# Patient Record
Sex: Male | Born: 1955
Health system: Southern US, Community
[De-identification: ages and names within clinical notes are randomized; demographics above are authoritative.]

## PROBLEM LIST (undated history)

## (undated) DIAGNOSIS — E7800 Pure hypercholesterolemia, unspecified: Secondary | ICD-10-CM

## (undated) DIAGNOSIS — I639 Cerebral infarction, unspecified: Secondary | ICD-10-CM

## (undated) DIAGNOSIS — I219 Acute myocardial infarction, unspecified: Secondary | ICD-10-CM

## (undated) DIAGNOSIS — L309 Dermatitis, unspecified: Secondary | ICD-10-CM

## (undated) DIAGNOSIS — H269 Unspecified cataract: Secondary | ICD-10-CM

## (undated) DIAGNOSIS — I671 Cerebral aneurysm, nonruptured: Secondary | ICD-10-CM

## (undated) DIAGNOSIS — I509 Heart failure, unspecified: Secondary | ICD-10-CM

## (undated) DIAGNOSIS — E119 Type 2 diabetes mellitus without complications: Secondary | ICD-10-CM

## (undated) DIAGNOSIS — J449 Chronic obstructive pulmonary disease, unspecified: Secondary | ICD-10-CM

## (undated) DIAGNOSIS — G459 Transient cerebral ischemic attack, unspecified: Secondary | ICD-10-CM

## (undated) HISTORY — DX: Type 2 diabetes mellitus without complications: E11.9

## (undated) HISTORY — PX: TOE AMPUTATION: SHX809

## (undated) HISTORY — PX: CORONARY ANGIOPLASTY WITH STENT PLACEMENT: SHX49

## (undated) HISTORY — DX: Heart failure, unspecified: I50.9

## (undated) HISTORY — PX: KNEE SURGERY: SHX244

## (undated) HISTORY — DX: Unspecified cataract: H26.9

## (undated) HISTORY — DX: Chronic obstructive pulmonary disease, unspecified: J44.9

## (undated) HISTORY — DX: Cerebral infarction, unspecified: I63.9

## (undated) HISTORY — DX: Transient cerebral ischemic attack, unspecified: G45.9

## (undated) HISTORY — DX: Dermatitis, unspecified: L30.9

## (undated) HISTORY — DX: Cerebral aneurysm, nonruptured: I67.1

## (undated) HISTORY — DX: Pure hypercholesterolemia, unspecified: E78.00

## (undated) HISTORY — DX: Acute myocardial infarction, unspecified: I21.9

---

## 2015-03-26 DIAGNOSIS — Z87891 Personal history of nicotine dependence: Secondary | ICD-10-CM | POA: Diagnosis not present

## 2015-03-26 DIAGNOSIS — M1990 Unspecified osteoarthritis, unspecified site: Secondary | ICD-10-CM | POA: Diagnosis present

## 2015-03-26 DIAGNOSIS — A419 Sepsis, unspecified organism: Secondary | ICD-10-CM | POA: Diagnosis not present

## 2015-03-26 DIAGNOSIS — L89892 Pressure ulcer of other site, stage 2: Secondary | ICD-10-CM | POA: Diagnosis present

## 2015-03-26 DIAGNOSIS — Z880 Allergy status to penicillin: Secondary | ICD-10-CM | POA: Diagnosis not present

## 2015-03-26 DIAGNOSIS — E11621 Type 2 diabetes mellitus with foot ulcer: Secondary | ICD-10-CM | POA: Diagnosis present

## 2015-03-26 DIAGNOSIS — L03115 Cellulitis of right lower limb: Secondary | ICD-10-CM | POA: Diagnosis present

## 2015-03-26 DIAGNOSIS — L02611 Cutaneous abscess of right foot: Secondary | ICD-10-CM | POA: Diagnosis present

## 2015-03-26 DIAGNOSIS — Z23 Encounter for immunization: Secondary | ICD-10-CM | POA: Diagnosis not present

## 2015-04-03 DIAGNOSIS — M869 Osteomyelitis, unspecified: Secondary | ICD-10-CM | POA: Diagnosis not present

## 2015-04-03 DIAGNOSIS — L03115 Cellulitis of right lower limb: Secondary | ICD-10-CM | POA: Diagnosis not present

## 2015-04-03 DIAGNOSIS — E1169 Type 2 diabetes mellitus with other specified complication: Secondary | ICD-10-CM | POA: Diagnosis not present

## 2015-04-03 DIAGNOSIS — L89892 Pressure ulcer of other site, stage 2: Secondary | ICD-10-CM | POA: Diagnosis not present

## 2015-04-04 DIAGNOSIS — E1169 Type 2 diabetes mellitus with other specified complication: Secondary | ICD-10-CM | POA: Diagnosis not present

## 2015-04-04 DIAGNOSIS — M869 Osteomyelitis, unspecified: Secondary | ICD-10-CM | POA: Diagnosis not present

## 2015-04-04 DIAGNOSIS — L89892 Pressure ulcer of other site, stage 2: Secondary | ICD-10-CM | POA: Diagnosis not present

## 2015-04-04 DIAGNOSIS — L03115 Cellulitis of right lower limb: Secondary | ICD-10-CM | POA: Diagnosis not present

## 2015-04-07 DIAGNOSIS — Z87891 Personal history of nicotine dependence: Secondary | ICD-10-CM | POA: Diagnosis not present

## 2015-04-07 DIAGNOSIS — E1169 Type 2 diabetes mellitus with other specified complication: Secondary | ICD-10-CM | POA: Diagnosis not present

## 2015-04-07 DIAGNOSIS — I10 Essential (primary) hypertension: Secondary | ICD-10-CM | POA: Diagnosis present

## 2015-04-07 DIAGNOSIS — L89892 Pressure ulcer of other site, stage 2: Secondary | ICD-10-CM | POA: Diagnosis not present

## 2015-04-07 DIAGNOSIS — L03115 Cellulitis of right lower limb: Secondary | ICD-10-CM | POA: Diagnosis present

## 2015-04-07 DIAGNOSIS — L02611 Cutaneous abscess of right foot: Secondary | ICD-10-CM | POA: Diagnosis not present

## 2015-04-07 DIAGNOSIS — I252 Old myocardial infarction: Secondary | ICD-10-CM | POA: Diagnosis not present

## 2015-04-07 DIAGNOSIS — E7800 Pure hypercholesterolemia, unspecified: Secondary | ICD-10-CM | POA: Diagnosis present

## 2015-04-07 DIAGNOSIS — M1990 Unspecified osteoarthritis, unspecified site: Secondary | ICD-10-CM | POA: Diagnosis present

## 2015-04-07 DIAGNOSIS — Z8673 Personal history of transient ischemic attack (TIA), and cerebral infarction without residual deficits: Secondary | ICD-10-CM | POA: Diagnosis not present

## 2015-04-07 DIAGNOSIS — Z79899 Other long term (current) drug therapy: Secondary | ICD-10-CM | POA: Diagnosis not present

## 2015-04-07 DIAGNOSIS — M869 Osteomyelitis, unspecified: Secondary | ICD-10-CM | POA: Diagnosis not present

## 2015-04-07 DIAGNOSIS — Z880 Allergy status to penicillin: Secondary | ICD-10-CM | POA: Diagnosis not present

## 2015-04-07 DIAGNOSIS — I509 Heart failure, unspecified: Secondary | ICD-10-CM | POA: Diagnosis present

## 2015-04-07 DIAGNOSIS — J449 Chronic obstructive pulmonary disease, unspecified: Secondary | ICD-10-CM | POA: Diagnosis present

## 2015-04-11 DIAGNOSIS — L89892 Pressure ulcer of other site, stage 2: Secondary | ICD-10-CM | POA: Diagnosis not present

## 2015-04-11 DIAGNOSIS — E1169 Type 2 diabetes mellitus with other specified complication: Secondary | ICD-10-CM | POA: Diagnosis not present

## 2015-04-11 DIAGNOSIS — M869 Osteomyelitis, unspecified: Secondary | ICD-10-CM | POA: Diagnosis not present

## 2015-04-11 DIAGNOSIS — L03115 Cellulitis of right lower limb: Secondary | ICD-10-CM | POA: Diagnosis not present

## 2015-04-12 DIAGNOSIS — L03115 Cellulitis of right lower limb: Secondary | ICD-10-CM | POA: Diagnosis not present

## 2015-04-12 DIAGNOSIS — M869 Osteomyelitis, unspecified: Secondary | ICD-10-CM | POA: Diagnosis not present

## 2015-04-12 DIAGNOSIS — L89892 Pressure ulcer of other site, stage 2: Secondary | ICD-10-CM | POA: Diagnosis not present

## 2015-04-12 DIAGNOSIS — E1169 Type 2 diabetes mellitus with other specified complication: Secondary | ICD-10-CM | POA: Diagnosis not present

## 2015-04-13 DIAGNOSIS — L03115 Cellulitis of right lower limb: Secondary | ICD-10-CM | POA: Diagnosis not present

## 2015-04-13 DIAGNOSIS — E1169 Type 2 diabetes mellitus with other specified complication: Secondary | ICD-10-CM | POA: Diagnosis not present

## 2015-04-13 DIAGNOSIS — L89892 Pressure ulcer of other site, stage 2: Secondary | ICD-10-CM | POA: Diagnosis not present

## 2015-04-13 DIAGNOSIS — M869 Osteomyelitis, unspecified: Secondary | ICD-10-CM | POA: Diagnosis not present

## 2015-04-14 DIAGNOSIS — Z87891 Personal history of nicotine dependence: Secondary | ICD-10-CM | POA: Diagnosis not present

## 2015-04-14 DIAGNOSIS — I110 Hypertensive heart disease with heart failure: Secondary | ICD-10-CM | POA: Diagnosis not present

## 2015-04-14 DIAGNOSIS — Z7984 Long term (current) use of oral hypoglycemic drugs: Secondary | ICD-10-CM | POA: Diagnosis not present

## 2015-04-14 DIAGNOSIS — L03115 Cellulitis of right lower limb: Secondary | ICD-10-CM | POA: Diagnosis not present

## 2015-04-14 DIAGNOSIS — I509 Heart failure, unspecified: Secondary | ICD-10-CM | POA: Diagnosis not present

## 2015-04-14 DIAGNOSIS — L97512 Non-pressure chronic ulcer of other part of right foot with fat layer exposed: Secondary | ICD-10-CM | POA: Diagnosis not present

## 2015-04-14 DIAGNOSIS — E11621 Type 2 diabetes mellitus with foot ulcer: Secondary | ICD-10-CM | POA: Diagnosis not present

## 2015-04-14 DIAGNOSIS — J449 Chronic obstructive pulmonary disease, unspecified: Secondary | ICD-10-CM | POA: Diagnosis not present

## 2015-04-14 DIAGNOSIS — L97411 Non-pressure chronic ulcer of right heel and midfoot limited to breakdown of skin: Secondary | ICD-10-CM | POA: Diagnosis not present

## 2015-04-14 DIAGNOSIS — M869 Osteomyelitis, unspecified: Secondary | ICD-10-CM | POA: Diagnosis not present

## 2015-04-14 DIAGNOSIS — G4730 Sleep apnea, unspecified: Secondary | ICD-10-CM | POA: Diagnosis not present

## 2015-04-14 DIAGNOSIS — G629 Polyneuropathy, unspecified: Secondary | ICD-10-CM | POA: Diagnosis not present

## 2015-04-14 DIAGNOSIS — L89892 Pressure ulcer of other site, stage 2: Secondary | ICD-10-CM | POA: Diagnosis not present

## 2015-04-14 DIAGNOSIS — E1169 Type 2 diabetes mellitus with other specified complication: Secondary | ICD-10-CM | POA: Diagnosis not present

## 2015-04-17 DIAGNOSIS — L03115 Cellulitis of right lower limb: Secondary | ICD-10-CM | POA: Diagnosis not present

## 2015-04-17 DIAGNOSIS — L89892 Pressure ulcer of other site, stage 2: Secondary | ICD-10-CM | POA: Diagnosis not present

## 2015-04-17 DIAGNOSIS — E1169 Type 2 diabetes mellitus with other specified complication: Secondary | ICD-10-CM | POA: Diagnosis not present

## 2015-04-17 DIAGNOSIS — M869 Osteomyelitis, unspecified: Secondary | ICD-10-CM | POA: Diagnosis not present

## 2015-04-19 DIAGNOSIS — L89892 Pressure ulcer of other site, stage 2: Secondary | ICD-10-CM | POA: Diagnosis not present

## 2015-04-19 DIAGNOSIS — E1169 Type 2 diabetes mellitus with other specified complication: Secondary | ICD-10-CM | POA: Diagnosis not present

## 2015-04-19 DIAGNOSIS — M869 Osteomyelitis, unspecified: Secondary | ICD-10-CM | POA: Diagnosis not present

## 2015-04-19 DIAGNOSIS — L03115 Cellulitis of right lower limb: Secondary | ICD-10-CM | POA: Diagnosis not present

## 2015-04-21 DIAGNOSIS — L97411 Non-pressure chronic ulcer of right heel and midfoot limited to breakdown of skin: Secondary | ICD-10-CM | POA: Diagnosis not present

## 2015-04-21 DIAGNOSIS — E11621 Type 2 diabetes mellitus with foot ulcer: Secondary | ICD-10-CM | POA: Diagnosis not present

## 2015-04-21 DIAGNOSIS — L97512 Non-pressure chronic ulcer of other part of right foot with fat layer exposed: Secondary | ICD-10-CM | POA: Diagnosis not present

## 2015-04-24 DIAGNOSIS — M869 Osteomyelitis, unspecified: Secondary | ICD-10-CM | POA: Diagnosis not present

## 2015-04-24 DIAGNOSIS — L03115 Cellulitis of right lower limb: Secondary | ICD-10-CM | POA: Diagnosis not present

## 2015-04-24 DIAGNOSIS — E1169 Type 2 diabetes mellitus with other specified complication: Secondary | ICD-10-CM | POA: Diagnosis not present

## 2015-04-24 DIAGNOSIS — L89892 Pressure ulcer of other site, stage 2: Secondary | ICD-10-CM | POA: Diagnosis not present

## 2015-04-26 DIAGNOSIS — L03115 Cellulitis of right lower limb: Secondary | ICD-10-CM | POA: Diagnosis not present

## 2015-04-26 DIAGNOSIS — E1169 Type 2 diabetes mellitus with other specified complication: Secondary | ICD-10-CM | POA: Diagnosis not present

## 2015-04-26 DIAGNOSIS — L89892 Pressure ulcer of other site, stage 2: Secondary | ICD-10-CM | POA: Diagnosis not present

## 2015-04-26 DIAGNOSIS — M869 Osteomyelitis, unspecified: Secondary | ICD-10-CM | POA: Diagnosis not present

## 2015-04-28 DIAGNOSIS — E11621 Type 2 diabetes mellitus with foot ulcer: Secondary | ICD-10-CM | POA: Diagnosis not present

## 2015-04-28 DIAGNOSIS — L97511 Non-pressure chronic ulcer of other part of right foot limited to breakdown of skin: Secondary | ICD-10-CM | POA: Diagnosis not present

## 2015-04-28 DIAGNOSIS — L97411 Non-pressure chronic ulcer of right heel and midfoot limited to breakdown of skin: Secondary | ICD-10-CM | POA: Diagnosis not present

## 2015-04-28 DIAGNOSIS — L97512 Non-pressure chronic ulcer of other part of right foot with fat layer exposed: Secondary | ICD-10-CM | POA: Diagnosis not present

## 2015-04-29 DIAGNOSIS — M869 Osteomyelitis, unspecified: Secondary | ICD-10-CM | POA: Diagnosis not present

## 2015-04-29 DIAGNOSIS — L89892 Pressure ulcer of other site, stage 2: Secondary | ICD-10-CM | POA: Diagnosis not present

## 2015-04-29 DIAGNOSIS — E1169 Type 2 diabetes mellitus with other specified complication: Secondary | ICD-10-CM | POA: Diagnosis not present

## 2015-04-29 DIAGNOSIS — L03115 Cellulitis of right lower limb: Secondary | ICD-10-CM | POA: Diagnosis not present

## 2015-05-01 DIAGNOSIS — M869 Osteomyelitis, unspecified: Secondary | ICD-10-CM | POA: Diagnosis not present

## 2015-05-01 DIAGNOSIS — E1169 Type 2 diabetes mellitus with other specified complication: Secondary | ICD-10-CM | POA: Diagnosis not present

## 2015-05-01 DIAGNOSIS — L03115 Cellulitis of right lower limb: Secondary | ICD-10-CM | POA: Diagnosis not present

## 2015-05-01 DIAGNOSIS — L89892 Pressure ulcer of other site, stage 2: Secondary | ICD-10-CM | POA: Diagnosis not present

## 2015-05-02 DIAGNOSIS — E1169 Type 2 diabetes mellitus with other specified complication: Secondary | ICD-10-CM | POA: Diagnosis not present

## 2015-05-02 DIAGNOSIS — L89892 Pressure ulcer of other site, stage 2: Secondary | ICD-10-CM | POA: Diagnosis not present

## 2015-05-02 DIAGNOSIS — M869 Osteomyelitis, unspecified: Secondary | ICD-10-CM | POA: Diagnosis not present

## 2015-05-02 DIAGNOSIS — L03115 Cellulitis of right lower limb: Secondary | ICD-10-CM | POA: Diagnosis not present

## 2015-05-03 DIAGNOSIS — L03115 Cellulitis of right lower limb: Secondary | ICD-10-CM | POA: Diagnosis not present

## 2015-05-03 DIAGNOSIS — L89892 Pressure ulcer of other site, stage 2: Secondary | ICD-10-CM | POA: Diagnosis not present

## 2015-05-03 DIAGNOSIS — M869 Osteomyelitis, unspecified: Secondary | ICD-10-CM | POA: Diagnosis not present

## 2015-05-03 DIAGNOSIS — E1169 Type 2 diabetes mellitus with other specified complication: Secondary | ICD-10-CM | POA: Diagnosis not present

## 2015-05-05 DIAGNOSIS — L97512 Non-pressure chronic ulcer of other part of right foot with fat layer exposed: Secondary | ICD-10-CM | POA: Diagnosis not present

## 2015-05-05 DIAGNOSIS — E11621 Type 2 diabetes mellitus with foot ulcer: Secondary | ICD-10-CM | POA: Diagnosis not present

## 2015-05-05 DIAGNOSIS — L97511 Non-pressure chronic ulcer of other part of right foot limited to breakdown of skin: Secondary | ICD-10-CM | POA: Diagnosis not present

## 2015-05-05 DIAGNOSIS — L97411 Non-pressure chronic ulcer of right heel and midfoot limited to breakdown of skin: Secondary | ICD-10-CM | POA: Diagnosis not present

## 2015-05-10 DIAGNOSIS — M869 Osteomyelitis, unspecified: Secondary | ICD-10-CM | POA: Diagnosis not present

## 2015-05-10 DIAGNOSIS — L89892 Pressure ulcer of other site, stage 2: Secondary | ICD-10-CM | POA: Diagnosis not present

## 2015-05-10 DIAGNOSIS — L03115 Cellulitis of right lower limb: Secondary | ICD-10-CM | POA: Diagnosis not present

## 2015-05-10 DIAGNOSIS — E1169 Type 2 diabetes mellitus with other specified complication: Secondary | ICD-10-CM | POA: Diagnosis not present

## 2015-05-12 DIAGNOSIS — E1169 Type 2 diabetes mellitus with other specified complication: Secondary | ICD-10-CM | POA: Diagnosis not present

## 2015-05-12 DIAGNOSIS — L03115 Cellulitis of right lower limb: Secondary | ICD-10-CM | POA: Diagnosis not present

## 2015-05-12 DIAGNOSIS — L89892 Pressure ulcer of other site, stage 2: Secondary | ICD-10-CM | POA: Diagnosis not present

## 2015-05-12 DIAGNOSIS — M869 Osteomyelitis, unspecified: Secondary | ICD-10-CM | POA: Diagnosis not present

## 2015-05-15 DIAGNOSIS — M869 Osteomyelitis, unspecified: Secondary | ICD-10-CM | POA: Diagnosis not present

## 2015-05-15 DIAGNOSIS — L03115 Cellulitis of right lower limb: Secondary | ICD-10-CM | POA: Diagnosis not present

## 2015-05-15 DIAGNOSIS — L89892 Pressure ulcer of other site, stage 2: Secondary | ICD-10-CM | POA: Diagnosis not present

## 2015-05-15 DIAGNOSIS — E1169 Type 2 diabetes mellitus with other specified complication: Secondary | ICD-10-CM | POA: Diagnosis not present

## 2015-05-17 DIAGNOSIS — M869 Osteomyelitis, unspecified: Secondary | ICD-10-CM | POA: Diagnosis not present

## 2015-05-17 DIAGNOSIS — L03115 Cellulitis of right lower limb: Secondary | ICD-10-CM | POA: Diagnosis not present

## 2015-05-17 DIAGNOSIS — L89892 Pressure ulcer of other site, stage 2: Secondary | ICD-10-CM | POA: Diagnosis not present

## 2015-05-17 DIAGNOSIS — E1169 Type 2 diabetes mellitus with other specified complication: Secondary | ICD-10-CM | POA: Diagnosis not present

## 2015-05-19 DIAGNOSIS — M869 Osteomyelitis, unspecified: Secondary | ICD-10-CM | POA: Diagnosis not present

## 2015-05-19 DIAGNOSIS — E1169 Type 2 diabetes mellitus with other specified complication: Secondary | ICD-10-CM | POA: Diagnosis not present

## 2015-05-19 DIAGNOSIS — L89892 Pressure ulcer of other site, stage 2: Secondary | ICD-10-CM | POA: Diagnosis not present

## 2015-05-19 DIAGNOSIS — L03115 Cellulitis of right lower limb: Secondary | ICD-10-CM | POA: Diagnosis not present

## 2015-05-22 DIAGNOSIS — E1169 Type 2 diabetes mellitus with other specified complication: Secondary | ICD-10-CM | POA: Diagnosis not present

## 2015-05-22 DIAGNOSIS — M869 Osteomyelitis, unspecified: Secondary | ICD-10-CM | POA: Diagnosis not present

## 2015-05-22 DIAGNOSIS — L89892 Pressure ulcer of other site, stage 2: Secondary | ICD-10-CM | POA: Diagnosis not present

## 2015-05-22 DIAGNOSIS — L03115 Cellulitis of right lower limb: Secondary | ICD-10-CM | POA: Diagnosis not present

## 2015-05-24 DIAGNOSIS — L03115 Cellulitis of right lower limb: Secondary | ICD-10-CM | POA: Diagnosis not present

## 2015-05-24 DIAGNOSIS — L89892 Pressure ulcer of other site, stage 2: Secondary | ICD-10-CM | POA: Diagnosis not present

## 2015-05-24 DIAGNOSIS — M869 Osteomyelitis, unspecified: Secondary | ICD-10-CM | POA: Diagnosis not present

## 2015-05-24 DIAGNOSIS — E1169 Type 2 diabetes mellitus with other specified complication: Secondary | ICD-10-CM | POA: Diagnosis not present

## 2015-05-26 DIAGNOSIS — L97411 Non-pressure chronic ulcer of right heel and midfoot limited to breakdown of skin: Secondary | ICD-10-CM | POA: Diagnosis not present

## 2015-05-26 DIAGNOSIS — L97512 Non-pressure chronic ulcer of other part of right foot with fat layer exposed: Secondary | ICD-10-CM | POA: Diagnosis not present

## 2015-05-26 DIAGNOSIS — E11621 Type 2 diabetes mellitus with foot ulcer: Secondary | ICD-10-CM | POA: Diagnosis not present

## 2015-05-26 DIAGNOSIS — L97514 Non-pressure chronic ulcer of other part of right foot with necrosis of bone: Secondary | ICD-10-CM | POA: Diagnosis not present

## 2015-05-29 DIAGNOSIS — M869 Osteomyelitis, unspecified: Secondary | ICD-10-CM | POA: Diagnosis not present

## 2015-05-29 DIAGNOSIS — L89892 Pressure ulcer of other site, stage 2: Secondary | ICD-10-CM | POA: Diagnosis not present

## 2015-05-29 DIAGNOSIS — L03115 Cellulitis of right lower limb: Secondary | ICD-10-CM | POA: Diagnosis not present

## 2015-05-29 DIAGNOSIS — E1169 Type 2 diabetes mellitus with other specified complication: Secondary | ICD-10-CM | POA: Diagnosis not present

## 2015-05-31 DIAGNOSIS — E1169 Type 2 diabetes mellitus with other specified complication: Secondary | ICD-10-CM | POA: Diagnosis not present

## 2015-05-31 DIAGNOSIS — M869 Osteomyelitis, unspecified: Secondary | ICD-10-CM | POA: Diagnosis not present

## 2015-05-31 DIAGNOSIS — L89892 Pressure ulcer of other site, stage 2: Secondary | ICD-10-CM | POA: Diagnosis not present

## 2015-05-31 DIAGNOSIS — L03115 Cellulitis of right lower limb: Secondary | ICD-10-CM | POA: Diagnosis not present

## 2015-06-02 DIAGNOSIS — L97512 Non-pressure chronic ulcer of other part of right foot with fat layer exposed: Secondary | ICD-10-CM | POA: Diagnosis not present

## 2015-06-02 DIAGNOSIS — L97511 Non-pressure chronic ulcer of other part of right foot limited to breakdown of skin: Secondary | ICD-10-CM | POA: Diagnosis not present

## 2015-06-02 DIAGNOSIS — L97411 Non-pressure chronic ulcer of right heel and midfoot limited to breakdown of skin: Secondary | ICD-10-CM | POA: Diagnosis not present

## 2015-06-02 DIAGNOSIS — E11621 Type 2 diabetes mellitus with foot ulcer: Secondary | ICD-10-CM | POA: Diagnosis not present

## 2015-06-02 DIAGNOSIS — L89892 Pressure ulcer of other site, stage 2: Secondary | ICD-10-CM | POA: Diagnosis not present

## 2015-06-02 DIAGNOSIS — L03115 Cellulitis of right lower limb: Secondary | ICD-10-CM | POA: Diagnosis not present

## 2015-06-02 DIAGNOSIS — E1169 Type 2 diabetes mellitus with other specified complication: Secondary | ICD-10-CM | POA: Diagnosis not present

## 2015-06-02 DIAGNOSIS — M869 Osteomyelitis, unspecified: Secondary | ICD-10-CM | POA: Diagnosis not present

## 2015-06-05 DIAGNOSIS — M869 Osteomyelitis, unspecified: Secondary | ICD-10-CM | POA: Diagnosis not present

## 2015-06-05 DIAGNOSIS — L03115 Cellulitis of right lower limb: Secondary | ICD-10-CM | POA: Diagnosis not present

## 2015-06-05 DIAGNOSIS — L89892 Pressure ulcer of other site, stage 2: Secondary | ICD-10-CM | POA: Diagnosis not present

## 2015-06-05 DIAGNOSIS — E1169 Type 2 diabetes mellitus with other specified complication: Secondary | ICD-10-CM | POA: Diagnosis not present

## 2015-06-07 DIAGNOSIS — L89892 Pressure ulcer of other site, stage 2: Secondary | ICD-10-CM | POA: Diagnosis not present

## 2015-06-07 DIAGNOSIS — M869 Osteomyelitis, unspecified: Secondary | ICD-10-CM | POA: Diagnosis not present

## 2015-06-07 DIAGNOSIS — L03115 Cellulitis of right lower limb: Secondary | ICD-10-CM | POA: Diagnosis not present

## 2015-06-07 DIAGNOSIS — E1169 Type 2 diabetes mellitus with other specified complication: Secondary | ICD-10-CM | POA: Diagnosis not present

## 2015-06-09 DIAGNOSIS — E11621 Type 2 diabetes mellitus with foot ulcer: Secondary | ICD-10-CM | POA: Diagnosis not present

## 2015-06-09 DIAGNOSIS — L97411 Non-pressure chronic ulcer of right heel and midfoot limited to breakdown of skin: Secondary | ICD-10-CM | POA: Diagnosis not present

## 2015-06-09 DIAGNOSIS — L97512 Non-pressure chronic ulcer of other part of right foot with fat layer exposed: Secondary | ICD-10-CM | POA: Diagnosis not present

## 2015-06-09 DIAGNOSIS — L97511 Non-pressure chronic ulcer of other part of right foot limited to breakdown of skin: Secondary | ICD-10-CM | POA: Diagnosis not present

## 2015-06-12 DIAGNOSIS — L89892 Pressure ulcer of other site, stage 2: Secondary | ICD-10-CM | POA: Diagnosis not present

## 2015-06-12 DIAGNOSIS — E1169 Type 2 diabetes mellitus with other specified complication: Secondary | ICD-10-CM | POA: Diagnosis not present

## 2015-06-12 DIAGNOSIS — L03115 Cellulitis of right lower limb: Secondary | ICD-10-CM | POA: Diagnosis not present

## 2015-06-12 DIAGNOSIS — M869 Osteomyelitis, unspecified: Secondary | ICD-10-CM | POA: Diagnosis not present

## 2015-06-14 DIAGNOSIS — L89892 Pressure ulcer of other site, stage 2: Secondary | ICD-10-CM | POA: Diagnosis not present

## 2015-06-14 DIAGNOSIS — E1169 Type 2 diabetes mellitus with other specified complication: Secondary | ICD-10-CM | POA: Diagnosis not present

## 2015-06-14 DIAGNOSIS — M869 Osteomyelitis, unspecified: Secondary | ICD-10-CM | POA: Diagnosis not present

## 2015-06-14 DIAGNOSIS — L03115 Cellulitis of right lower limb: Secondary | ICD-10-CM | POA: Diagnosis not present

## 2015-06-16 DIAGNOSIS — L97511 Non-pressure chronic ulcer of other part of right foot limited to breakdown of skin: Secondary | ICD-10-CM | POA: Diagnosis not present

## 2015-06-16 DIAGNOSIS — L97512 Non-pressure chronic ulcer of other part of right foot with fat layer exposed: Secondary | ICD-10-CM | POA: Diagnosis not present

## 2015-06-16 DIAGNOSIS — E11621 Type 2 diabetes mellitus with foot ulcer: Secondary | ICD-10-CM | POA: Diagnosis not present

## 2015-06-16 DIAGNOSIS — L97411 Non-pressure chronic ulcer of right heel and midfoot limited to breakdown of skin: Secondary | ICD-10-CM | POA: Diagnosis not present

## 2015-06-19 DIAGNOSIS — L03115 Cellulitis of right lower limb: Secondary | ICD-10-CM | POA: Diagnosis not present

## 2015-06-19 DIAGNOSIS — E1169 Type 2 diabetes mellitus with other specified complication: Secondary | ICD-10-CM | POA: Diagnosis not present

## 2015-06-19 DIAGNOSIS — L89892 Pressure ulcer of other site, stage 2: Secondary | ICD-10-CM | POA: Diagnosis not present

## 2015-06-19 DIAGNOSIS — M869 Osteomyelitis, unspecified: Secondary | ICD-10-CM | POA: Diagnosis not present

## 2015-06-21 DIAGNOSIS — L03115 Cellulitis of right lower limb: Secondary | ICD-10-CM | POA: Diagnosis not present

## 2015-06-21 DIAGNOSIS — E1169 Type 2 diabetes mellitus with other specified complication: Secondary | ICD-10-CM | POA: Diagnosis not present

## 2015-06-21 DIAGNOSIS — L89892 Pressure ulcer of other site, stage 2: Secondary | ICD-10-CM | POA: Diagnosis not present

## 2015-06-21 DIAGNOSIS — M869 Osteomyelitis, unspecified: Secondary | ICD-10-CM | POA: Diagnosis not present

## 2015-06-23 DIAGNOSIS — L97419 Non-pressure chronic ulcer of right heel and midfoot with unspecified severity: Secondary | ICD-10-CM | POA: Diagnosis not present

## 2015-06-23 DIAGNOSIS — L97512 Non-pressure chronic ulcer of other part of right foot with fat layer exposed: Secondary | ICD-10-CM | POA: Diagnosis not present

## 2015-06-23 DIAGNOSIS — E11621 Type 2 diabetes mellitus with foot ulcer: Secondary | ICD-10-CM | POA: Diagnosis not present

## 2015-06-23 DIAGNOSIS — L97511 Non-pressure chronic ulcer of other part of right foot limited to breakdown of skin: Secondary | ICD-10-CM | POA: Diagnosis not present

## 2015-06-26 DIAGNOSIS — L03115 Cellulitis of right lower limb: Secondary | ICD-10-CM | POA: Diagnosis not present

## 2015-06-26 DIAGNOSIS — L89892 Pressure ulcer of other site, stage 2: Secondary | ICD-10-CM | POA: Diagnosis not present

## 2015-06-26 DIAGNOSIS — M869 Osteomyelitis, unspecified: Secondary | ICD-10-CM | POA: Diagnosis not present

## 2015-06-26 DIAGNOSIS — E1169 Type 2 diabetes mellitus with other specified complication: Secondary | ICD-10-CM | POA: Diagnosis not present

## 2015-06-28 DIAGNOSIS — L03115 Cellulitis of right lower limb: Secondary | ICD-10-CM | POA: Diagnosis not present

## 2015-06-28 DIAGNOSIS — L89892 Pressure ulcer of other site, stage 2: Secondary | ICD-10-CM | POA: Diagnosis not present

## 2015-06-28 DIAGNOSIS — E1169 Type 2 diabetes mellitus with other specified complication: Secondary | ICD-10-CM | POA: Diagnosis not present

## 2015-06-28 DIAGNOSIS — M869 Osteomyelitis, unspecified: Secondary | ICD-10-CM | POA: Diagnosis not present

## 2015-06-30 DIAGNOSIS — L97512 Non-pressure chronic ulcer of other part of right foot with fat layer exposed: Secondary | ICD-10-CM | POA: Diagnosis not present

## 2015-06-30 DIAGNOSIS — E11621 Type 2 diabetes mellitus with foot ulcer: Secondary | ICD-10-CM | POA: Diagnosis not present

## 2015-06-30 DIAGNOSIS — L97511 Non-pressure chronic ulcer of other part of right foot limited to breakdown of skin: Secondary | ICD-10-CM | POA: Diagnosis not present

## 2015-07-03 DIAGNOSIS — L89892 Pressure ulcer of other site, stage 2: Secondary | ICD-10-CM | POA: Diagnosis not present

## 2015-07-03 DIAGNOSIS — E1169 Type 2 diabetes mellitus with other specified complication: Secondary | ICD-10-CM | POA: Diagnosis not present

## 2015-07-03 DIAGNOSIS — L03115 Cellulitis of right lower limb: Secondary | ICD-10-CM | POA: Diagnosis not present

## 2015-07-03 DIAGNOSIS — M869 Osteomyelitis, unspecified: Secondary | ICD-10-CM | POA: Diagnosis not present

## 2015-07-07 DIAGNOSIS — E11621 Type 2 diabetes mellitus with foot ulcer: Secondary | ICD-10-CM | POA: Diagnosis not present

## 2015-07-07 DIAGNOSIS — L97519 Non-pressure chronic ulcer of other part of right foot with unspecified severity: Secondary | ICD-10-CM | POA: Diagnosis not present

## 2015-07-07 DIAGNOSIS — L97511 Non-pressure chronic ulcer of other part of right foot limited to breakdown of skin: Secondary | ICD-10-CM | POA: Diagnosis not present

## 2015-07-11 DIAGNOSIS — L03115 Cellulitis of right lower limb: Secondary | ICD-10-CM | POA: Diagnosis not present

## 2015-07-11 DIAGNOSIS — E1169 Type 2 diabetes mellitus with other specified complication: Secondary | ICD-10-CM | POA: Diagnosis not present

## 2015-07-11 DIAGNOSIS — M869 Osteomyelitis, unspecified: Secondary | ICD-10-CM | POA: Diagnosis not present

## 2015-07-11 DIAGNOSIS — L89892 Pressure ulcer of other site, stage 2: Secondary | ICD-10-CM | POA: Diagnosis not present

## 2015-07-14 DIAGNOSIS — E11621 Type 2 diabetes mellitus with foot ulcer: Secondary | ICD-10-CM | POA: Diagnosis not present

## 2015-07-14 DIAGNOSIS — L97514 Non-pressure chronic ulcer of other part of right foot with necrosis of bone: Secondary | ICD-10-CM | POA: Diagnosis not present

## 2015-07-18 DIAGNOSIS — E1169 Type 2 diabetes mellitus with other specified complication: Secondary | ICD-10-CM | POA: Diagnosis not present

## 2015-07-18 DIAGNOSIS — L03115 Cellulitis of right lower limb: Secondary | ICD-10-CM | POA: Diagnosis not present

## 2015-07-18 DIAGNOSIS — L89892 Pressure ulcer of other site, stage 2: Secondary | ICD-10-CM | POA: Diagnosis not present

## 2015-07-18 DIAGNOSIS — M869 Osteomyelitis, unspecified: Secondary | ICD-10-CM | POA: Diagnosis not present

## 2015-07-25 DIAGNOSIS — L03115 Cellulitis of right lower limb: Secondary | ICD-10-CM | POA: Diagnosis not present

## 2015-07-25 DIAGNOSIS — M869 Osteomyelitis, unspecified: Secondary | ICD-10-CM | POA: Diagnosis not present

## 2015-07-25 DIAGNOSIS — L89892 Pressure ulcer of other site, stage 2: Secondary | ICD-10-CM | POA: Diagnosis not present

## 2015-07-25 DIAGNOSIS — E1169 Type 2 diabetes mellitus with other specified complication: Secondary | ICD-10-CM | POA: Diagnosis not present

## 2015-07-28 DIAGNOSIS — L97519 Non-pressure chronic ulcer of other part of right foot with unspecified severity: Secondary | ICD-10-CM | POA: Diagnosis not present

## 2015-07-28 DIAGNOSIS — E11621 Type 2 diabetes mellitus with foot ulcer: Secondary | ICD-10-CM | POA: Diagnosis not present

## 2015-07-28 DIAGNOSIS — L97513 Non-pressure chronic ulcer of other part of right foot with necrosis of muscle: Secondary | ICD-10-CM | POA: Diagnosis not present

## 2015-08-01 DIAGNOSIS — M869 Osteomyelitis, unspecified: Secondary | ICD-10-CM | POA: Diagnosis not present

## 2015-08-01 DIAGNOSIS — L89892 Pressure ulcer of other site, stage 2: Secondary | ICD-10-CM | POA: Diagnosis not present

## 2015-08-01 DIAGNOSIS — L03115 Cellulitis of right lower limb: Secondary | ICD-10-CM | POA: Diagnosis not present

## 2015-08-01 DIAGNOSIS — E1169 Type 2 diabetes mellitus with other specified complication: Secondary | ICD-10-CM | POA: Diagnosis not present

## 2015-08-04 DIAGNOSIS — E11621 Type 2 diabetes mellitus with foot ulcer: Secondary | ICD-10-CM | POA: Diagnosis not present

## 2015-08-04 DIAGNOSIS — L97511 Non-pressure chronic ulcer of other part of right foot limited to breakdown of skin: Secondary | ICD-10-CM | POA: Diagnosis not present

## 2015-08-08 DIAGNOSIS — L03115 Cellulitis of right lower limb: Secondary | ICD-10-CM | POA: Diagnosis not present

## 2015-08-08 DIAGNOSIS — M869 Osteomyelitis, unspecified: Secondary | ICD-10-CM | POA: Diagnosis not present

## 2015-08-08 DIAGNOSIS — L89892 Pressure ulcer of other site, stage 2: Secondary | ICD-10-CM | POA: Diagnosis not present

## 2015-08-08 DIAGNOSIS — E1169 Type 2 diabetes mellitus with other specified complication: Secondary | ICD-10-CM | POA: Diagnosis not present

## 2015-08-11 DIAGNOSIS — Z8631 Personal history of diabetic foot ulcer: Secondary | ICD-10-CM | POA: Diagnosis not present

## 2015-08-11 DIAGNOSIS — E11621 Type 2 diabetes mellitus with foot ulcer: Secondary | ICD-10-CM | POA: Diagnosis not present

## 2015-08-11 DIAGNOSIS — L97513 Non-pressure chronic ulcer of other part of right foot with necrosis of muscle: Secondary | ICD-10-CM | POA: Diagnosis not present

## 2015-08-11 DIAGNOSIS — Z09 Encounter for follow-up examination after completed treatment for conditions other than malignant neoplasm: Secondary | ICD-10-CM | POA: Diagnosis not present

## 2015-08-11 DIAGNOSIS — E119 Type 2 diabetes mellitus without complications: Secondary | ICD-10-CM | POA: Diagnosis not present

## 2015-08-28 DIAGNOSIS — R609 Edema, unspecified: Secondary | ICD-10-CM | POA: Diagnosis not present

## 2015-08-28 DIAGNOSIS — M79672 Pain in left foot: Secondary | ICD-10-CM | POA: Diagnosis not present

## 2015-08-28 DIAGNOSIS — R0602 Shortness of breath: Secondary | ICD-10-CM | POA: Diagnosis not present

## 2015-08-28 DIAGNOSIS — S92342S Displaced fracture of fourth metatarsal bone, left foot, sequela: Secondary | ICD-10-CM | POA: Diagnosis not present

## 2015-08-28 DIAGNOSIS — M7989 Other specified soft tissue disorders: Secondary | ICD-10-CM | POA: Diagnosis not present

## 2015-08-28 DIAGNOSIS — S92332S Displaced fracture of third metatarsal bone, left foot, sequela: Secondary | ICD-10-CM | POA: Diagnosis not present

## 2015-10-04 DIAGNOSIS — M25551 Pain in right hip: Secondary | ICD-10-CM | POA: Diagnosis not present

## 2015-10-22 DIAGNOSIS — Z7984 Long term (current) use of oral hypoglycemic drugs: Secondary | ICD-10-CM | POA: Diagnosis not present

## 2015-10-22 DIAGNOSIS — Z79899 Other long term (current) drug therapy: Secondary | ICD-10-CM | POA: Diagnosis not present

## 2015-10-22 DIAGNOSIS — F17210 Nicotine dependence, cigarettes, uncomplicated: Secondary | ICD-10-CM | POA: Diagnosis not present

## 2015-10-22 DIAGNOSIS — E1140 Type 2 diabetes mellitus with diabetic neuropathy, unspecified: Secondary | ICD-10-CM | POA: Diagnosis not present

## 2015-10-22 DIAGNOSIS — E7800 Pure hypercholesterolemia, unspecified: Secondary | ICD-10-CM | POA: Diagnosis not present

## 2015-10-22 DIAGNOSIS — J449 Chronic obstructive pulmonary disease, unspecified: Secondary | ICD-10-CM | POA: Diagnosis not present

## 2015-10-22 DIAGNOSIS — I110 Hypertensive heart disease with heart failure: Secondary | ICD-10-CM | POA: Diagnosis not present

## 2015-10-22 DIAGNOSIS — I509 Heart failure, unspecified: Secondary | ICD-10-CM | POA: Diagnosis not present

## 2015-10-22 DIAGNOSIS — Z8673 Personal history of transient ischemic attack (TIA), and cerebral infarction without residual deficits: Secondary | ICD-10-CM | POA: Diagnosis not present

## 2015-10-22 DIAGNOSIS — R531 Weakness: Secondary | ICD-10-CM | POA: Diagnosis not present

## 2015-10-22 DIAGNOSIS — E1165 Type 2 diabetes mellitus with hyperglycemia: Secondary | ICD-10-CM | POA: Diagnosis not present

## 2015-10-22 DIAGNOSIS — R0602 Shortness of breath: Secondary | ICD-10-CM | POA: Diagnosis not present

## 2015-10-22 DIAGNOSIS — I252 Old myocardial infarction: Secondary | ICD-10-CM | POA: Diagnosis not present

## 2015-10-22 DIAGNOSIS — Z7952 Long term (current) use of systemic steroids: Secondary | ICD-10-CM | POA: Diagnosis not present

## 2015-12-19 DIAGNOSIS — I639 Cerebral infarction, unspecified: Secondary | ICD-10-CM | POA: Insufficient documentation

## 2016-06-26 DIAGNOSIS — R079 Chest pain, unspecified: Secondary | ICD-10-CM | POA: Diagnosis not present

## 2016-06-26 DIAGNOSIS — F17210 Nicotine dependence, cigarettes, uncomplicated: Secondary | ICD-10-CM | POA: Diagnosis not present

## 2016-06-26 DIAGNOSIS — Z880 Allergy status to penicillin: Secondary | ICD-10-CM | POA: Diagnosis not present

## 2016-06-26 DIAGNOSIS — Z79899 Other long term (current) drug therapy: Secondary | ICD-10-CM | POA: Diagnosis not present

## 2016-06-26 DIAGNOSIS — I110 Hypertensive heart disease with heart failure: Secondary | ICD-10-CM | POA: Diagnosis not present

## 2016-06-26 DIAGNOSIS — I252 Old myocardial infarction: Secondary | ICD-10-CM | POA: Diagnosis not present

## 2016-06-26 DIAGNOSIS — I472 Ventricular tachycardia: Secondary | ICD-10-CM | POA: Diagnosis not present

## 2016-06-26 DIAGNOSIS — R072 Precordial pain: Secondary | ICD-10-CM | POA: Diagnosis not present

## 2016-06-26 DIAGNOSIS — E11621 Type 2 diabetes mellitus with foot ulcer: Secondary | ICD-10-CM | POA: Diagnosis not present

## 2016-06-26 DIAGNOSIS — Z8249 Family history of ischemic heart disease and other diseases of the circulatory system: Secondary | ICD-10-CM | POA: Diagnosis not present

## 2016-06-26 DIAGNOSIS — I5021 Acute systolic (congestive) heart failure: Secondary | ICD-10-CM | POA: Diagnosis present

## 2016-06-26 DIAGNOSIS — J449 Chronic obstructive pulmonary disease, unspecified: Secondary | ICD-10-CM | POA: Diagnosis present

## 2016-06-26 DIAGNOSIS — R0602 Shortness of breath: Secondary | ICD-10-CM | POA: Diagnosis not present

## 2016-06-26 DIAGNOSIS — R0789 Other chest pain: Secondary | ICD-10-CM | POA: Diagnosis not present

## 2016-06-26 DIAGNOSIS — L97519 Non-pressure chronic ulcer of other part of right foot with unspecified severity: Secondary | ICD-10-CM | POA: Diagnosis present

## 2016-06-26 DIAGNOSIS — I509 Heart failure, unspecified: Secondary | ICD-10-CM | POA: Diagnosis not present

## 2016-06-26 DIAGNOSIS — Z23 Encounter for immunization: Secondary | ICD-10-CM | POA: Diagnosis not present

## 2016-06-26 DIAGNOSIS — Z7982 Long term (current) use of aspirin: Secondary | ICD-10-CM | POA: Diagnosis not present

## 2016-06-26 DIAGNOSIS — Z8673 Personal history of transient ischemic attack (TIA), and cerebral infarction without residual deficits: Secondary | ICD-10-CM | POA: Diagnosis not present

## 2016-06-26 DIAGNOSIS — E7800 Pure hypercholesterolemia, unspecified: Secondary | ICD-10-CM | POA: Diagnosis present

## 2016-06-26 DIAGNOSIS — I428 Other cardiomyopathies: Secondary | ICD-10-CM | POA: Diagnosis not present

## 2016-07-02 DIAGNOSIS — Z6835 Body mass index (BMI) 35.0-35.9, adult: Secondary | ICD-10-CM | POA: Diagnosis not present

## 2016-07-02 DIAGNOSIS — E11621 Type 2 diabetes mellitus with foot ulcer: Secondary | ICD-10-CM | POA: Diagnosis not present

## 2016-07-02 DIAGNOSIS — I10 Essential (primary) hypertension: Secondary | ICD-10-CM | POA: Diagnosis not present

## 2016-07-02 DIAGNOSIS — I509 Heart failure, unspecified: Secondary | ICD-10-CM | POA: Diagnosis not present

## 2016-07-02 DIAGNOSIS — E1165 Type 2 diabetes mellitus with hyperglycemia: Secondary | ICD-10-CM | POA: Diagnosis not present

## 2016-07-02 DIAGNOSIS — L97519 Non-pressure chronic ulcer of other part of right foot with unspecified severity: Secondary | ICD-10-CM | POA: Diagnosis not present

## 2016-07-16 DIAGNOSIS — I10 Essential (primary) hypertension: Secondary | ICD-10-CM | POA: Diagnosis not present

## 2016-07-16 DIAGNOSIS — E119 Type 2 diabetes mellitus without complications: Secondary | ICD-10-CM | POA: Diagnosis not present

## 2016-07-16 DIAGNOSIS — E785 Hyperlipidemia, unspecified: Secondary | ICD-10-CM

## 2016-07-16 DIAGNOSIS — I420 Dilated cardiomyopathy: Secondary | ICD-10-CM | POA: Insufficient documentation

## 2016-07-16 DIAGNOSIS — Z91199 Patient's noncompliance with other medical treatment and regimen due to unspecified reason: Secondary | ICD-10-CM

## 2016-07-16 DIAGNOSIS — Z9119 Patient's noncompliance with other medical treatment and regimen: Secondary | ICD-10-CM | POA: Diagnosis not present

## 2016-07-16 DIAGNOSIS — IMO0001 Reserved for inherently not codable concepts without codable children: Secondary | ICD-10-CM | POA: Insufficient documentation

## 2016-07-16 DIAGNOSIS — F17200 Nicotine dependence, unspecified, uncomplicated: Secondary | ICD-10-CM

## 2016-07-16 DIAGNOSIS — E784 Other hyperlipidemia: Secondary | ICD-10-CM | POA: Diagnosis not present

## 2016-07-16 HISTORY — DX: Reserved for inherently not codable concepts without codable children: IMO0001

## 2016-07-16 HISTORY — DX: Patient's noncompliance with other medical treatment and regimen due to unspecified reason: Z91.199

## 2016-07-16 HISTORY — DX: Hyperlipidemia, unspecified: E78.5

## 2016-07-16 HISTORY — DX: Patient's noncompliance with other medical treatment and regimen: Z91.19

## 2016-07-16 HISTORY — DX: Nicotine dependence, unspecified, uncomplicated: F17.200

## 2016-07-16 HISTORY — DX: Dilated cardiomyopathy: I42.0

## 2016-07-25 DIAGNOSIS — I10 Essential (primary) hypertension: Secondary | ICD-10-CM | POA: Diagnosis not present

## 2016-07-30 DIAGNOSIS — L97411 Non-pressure chronic ulcer of right heel and midfoot limited to breakdown of skin: Secondary | ICD-10-CM | POA: Diagnosis not present

## 2016-07-30 DIAGNOSIS — I509 Heart failure, unspecified: Secondary | ICD-10-CM | POA: Diagnosis not present

## 2016-07-30 DIAGNOSIS — Z87891 Personal history of nicotine dependence: Secondary | ICD-10-CM | POA: Diagnosis not present

## 2016-07-30 DIAGNOSIS — E1140 Type 2 diabetes mellitus with diabetic neuropathy, unspecified: Secondary | ICD-10-CM | POA: Diagnosis not present

## 2016-07-30 DIAGNOSIS — J449 Chronic obstructive pulmonary disease, unspecified: Secondary | ICD-10-CM | POA: Diagnosis not present

## 2016-07-30 DIAGNOSIS — R0681 Apnea, not elsewhere classified: Secondary | ICD-10-CM | POA: Diagnosis not present

## 2016-07-30 DIAGNOSIS — L97512 Non-pressure chronic ulcer of other part of right foot with fat layer exposed: Secondary | ICD-10-CM | POA: Diagnosis not present

## 2016-07-30 DIAGNOSIS — I110 Hypertensive heart disease with heart failure: Secondary | ICD-10-CM | POA: Diagnosis not present

## 2016-07-30 DIAGNOSIS — E11621 Type 2 diabetes mellitus with foot ulcer: Secondary | ICD-10-CM | POA: Diagnosis not present

## 2016-08-06 DIAGNOSIS — L97411 Non-pressure chronic ulcer of right heel and midfoot limited to breakdown of skin: Secondary | ICD-10-CM | POA: Diagnosis not present

## 2016-08-06 DIAGNOSIS — E1140 Type 2 diabetes mellitus with diabetic neuropathy, unspecified: Secondary | ICD-10-CM | POA: Diagnosis not present

## 2016-08-06 DIAGNOSIS — E11621 Type 2 diabetes mellitus with foot ulcer: Secondary | ICD-10-CM | POA: Diagnosis not present

## 2016-08-06 DIAGNOSIS — L97512 Non-pressure chronic ulcer of other part of right foot with fat layer exposed: Secondary | ICD-10-CM | POA: Diagnosis not present

## 2016-08-07 DIAGNOSIS — I420 Dilated cardiomyopathy: Secondary | ICD-10-CM | POA: Diagnosis not present

## 2016-08-07 DIAGNOSIS — F17200 Nicotine dependence, unspecified, uncomplicated: Secondary | ICD-10-CM | POA: Diagnosis not present

## 2016-08-07 DIAGNOSIS — E119 Type 2 diabetes mellitus without complications: Secondary | ICD-10-CM | POA: Diagnosis not present

## 2016-08-07 DIAGNOSIS — E784 Other hyperlipidemia: Secondary | ICD-10-CM | POA: Diagnosis not present

## 2016-08-07 DIAGNOSIS — I10 Essential (primary) hypertension: Secondary | ICD-10-CM | POA: Diagnosis not present

## 2016-08-14 DIAGNOSIS — E11621 Type 2 diabetes mellitus with foot ulcer: Secondary | ICD-10-CM | POA: Diagnosis not present

## 2016-08-14 DIAGNOSIS — L97821 Non-pressure chronic ulcer of other part of left lower leg limited to breakdown of skin: Secondary | ICD-10-CM | POA: Diagnosis not present

## 2016-08-14 DIAGNOSIS — I872 Venous insufficiency (chronic) (peripheral): Secondary | ICD-10-CM | POA: Diagnosis not present

## 2016-08-14 DIAGNOSIS — L97512 Non-pressure chronic ulcer of other part of right foot with fat layer exposed: Secondary | ICD-10-CM | POA: Diagnosis not present

## 2016-08-14 DIAGNOSIS — L97822 Non-pressure chronic ulcer of other part of left lower leg with fat layer exposed: Secondary | ICD-10-CM | POA: Diagnosis not present

## 2016-08-14 DIAGNOSIS — L97411 Non-pressure chronic ulcer of right heel and midfoot limited to breakdown of skin: Secondary | ICD-10-CM | POA: Diagnosis not present

## 2016-08-14 DIAGNOSIS — E11622 Type 2 diabetes mellitus with other skin ulcer: Secondary | ICD-10-CM | POA: Diagnosis not present

## 2016-08-16 DIAGNOSIS — E11621 Type 2 diabetes mellitus with foot ulcer: Secondary | ICD-10-CM | POA: Diagnosis not present

## 2016-08-16 DIAGNOSIS — L97411 Non-pressure chronic ulcer of right heel and midfoot limited to breakdown of skin: Secondary | ICD-10-CM | POA: Diagnosis not present

## 2016-08-16 DIAGNOSIS — L97821 Non-pressure chronic ulcer of other part of left lower leg limited to breakdown of skin: Secondary | ICD-10-CM | POA: Diagnosis not present

## 2016-08-16 DIAGNOSIS — I872 Venous insufficiency (chronic) (peripheral): Secondary | ICD-10-CM | POA: Diagnosis not present

## 2016-08-20 DIAGNOSIS — L089 Local infection of the skin and subcutaneous tissue, unspecified: Secondary | ICD-10-CM | POA: Diagnosis not present

## 2016-08-20 DIAGNOSIS — Z741 Need for assistance with personal care: Secondary | ICD-10-CM | POA: Diagnosis not present

## 2016-08-20 DIAGNOSIS — L97409 Non-pressure chronic ulcer of unspecified heel and midfoot with unspecified severity: Secondary | ICD-10-CM | POA: Diagnosis not present

## 2016-08-20 DIAGNOSIS — F17210 Nicotine dependence, cigarettes, uncomplicated: Secondary | ICD-10-CM | POA: Diagnosis present

## 2016-08-20 DIAGNOSIS — Z720 Tobacco use: Secondary | ICD-10-CM | POA: Diagnosis not present

## 2016-08-20 DIAGNOSIS — M869 Osteomyelitis, unspecified: Secondary | ICD-10-CM | POA: Diagnosis not present

## 2016-08-20 DIAGNOSIS — R9431 Abnormal electrocardiogram [ECG] [EKG]: Secondary | ICD-10-CM | POA: Diagnosis not present

## 2016-08-20 DIAGNOSIS — B9561 Methicillin susceptible Staphylococcus aureus infection as the cause of diseases classified elsewhere: Secondary | ICD-10-CM | POA: Diagnosis not present

## 2016-08-20 DIAGNOSIS — I509 Heart failure, unspecified: Secondary | ICD-10-CM | POA: Diagnosis not present

## 2016-08-20 DIAGNOSIS — R5081 Fever presenting with conditions classified elsewhere: Secondary | ICD-10-CM | POA: Diagnosis not present

## 2016-08-20 DIAGNOSIS — N390 Urinary tract infection, site not specified: Secondary | ICD-10-CM | POA: Diagnosis not present

## 2016-08-20 DIAGNOSIS — E784 Other hyperlipidemia: Secondary | ICD-10-CM | POA: Diagnosis not present

## 2016-08-20 DIAGNOSIS — R7881 Bacteremia: Secondary | ICD-10-CM | POA: Diagnosis not present

## 2016-08-20 DIAGNOSIS — E11628 Type 2 diabetes mellitus with other skin complications: Secondary | ICD-10-CM | POA: Diagnosis not present

## 2016-08-20 DIAGNOSIS — L97414 Non-pressure chronic ulcer of right heel and midfoot with necrosis of bone: Secondary | ICD-10-CM | POA: Diagnosis not present

## 2016-08-20 DIAGNOSIS — M6281 Muscle weakness (generalized): Secondary | ICD-10-CM | POA: Diagnosis not present

## 2016-08-20 DIAGNOSIS — I361 Nonrheumatic tricuspid (valve) insufficiency: Secondary | ICD-10-CM | POA: Diagnosis not present

## 2016-08-20 DIAGNOSIS — R2241 Localized swelling, mass and lump, right lower limb: Secondary | ICD-10-CM | POA: Diagnosis not present

## 2016-08-20 DIAGNOSIS — I340 Nonrheumatic mitral (valve) insufficiency: Secondary | ICD-10-CM | POA: Diagnosis not present

## 2016-08-20 DIAGNOSIS — Z7984 Long term (current) use of oral hypoglycemic drugs: Secondary | ICD-10-CM | POA: Diagnosis not present

## 2016-08-20 DIAGNOSIS — N3000 Acute cystitis without hematuria: Secondary | ICD-10-CM | POA: Diagnosis not present

## 2016-08-20 DIAGNOSIS — E1142 Type 2 diabetes mellitus with diabetic polyneuropathy: Secondary | ICD-10-CM | POA: Diagnosis not present

## 2016-08-20 DIAGNOSIS — I428 Other cardiomyopathies: Secondary | ICD-10-CM | POA: Diagnosis not present

## 2016-08-20 DIAGNOSIS — I358 Other nonrheumatic aortic valve disorders: Secondary | ICD-10-CM | POA: Diagnosis present

## 2016-08-20 DIAGNOSIS — Z8673 Personal history of transient ischemic attack (TIA), and cerebral infarction without residual deficits: Secondary | ICD-10-CM | POA: Diagnosis not present

## 2016-08-20 DIAGNOSIS — I420 Dilated cardiomyopathy: Secondary | ICD-10-CM | POA: Diagnosis not present

## 2016-08-20 DIAGNOSIS — I255 Ischemic cardiomyopathy: Secondary | ICD-10-CM | POA: Diagnosis not present

## 2016-08-20 DIAGNOSIS — I429 Cardiomyopathy, unspecified: Secondary | ICD-10-CM | POA: Diagnosis not present

## 2016-08-20 DIAGNOSIS — A491 Streptococcal infection, unspecified site: Secondary | ICD-10-CM | POA: Diagnosis not present

## 2016-08-20 DIAGNOSIS — Z794 Long term (current) use of insulin: Secondary | ICD-10-CM | POA: Diagnosis not present

## 2016-08-20 DIAGNOSIS — A4901 Methicillin susceptible Staphylococcus aureus infection, unspecified site: Secondary | ICD-10-CM | POA: Diagnosis not present

## 2016-08-20 DIAGNOSIS — I5043 Acute on chronic combined systolic (congestive) and diastolic (congestive) heart failure: Secondary | ICD-10-CM | POA: Diagnosis not present

## 2016-08-20 DIAGNOSIS — M1990 Unspecified osteoarthritis, unspecified site: Secondary | ICD-10-CM | POA: Diagnosis present

## 2016-08-20 DIAGNOSIS — I5023 Acute on chronic systolic (congestive) heart failure: Secondary | ICD-10-CM | POA: Diagnosis present

## 2016-08-20 DIAGNOSIS — R079 Chest pain, unspecified: Secondary | ICD-10-CM | POA: Diagnosis not present

## 2016-08-20 DIAGNOSIS — R0600 Dyspnea, unspecified: Secondary | ICD-10-CM | POA: Diagnosis not present

## 2016-08-20 DIAGNOSIS — R2689 Other abnormalities of gait and mobility: Secondary | ICD-10-CM | POA: Diagnosis not present

## 2016-08-20 DIAGNOSIS — E1165 Type 2 diabetes mellitus with hyperglycemia: Secondary | ICD-10-CM | POA: Diagnosis not present

## 2016-08-20 DIAGNOSIS — I700 Atherosclerosis of aorta: Secondary | ICD-10-CM | POA: Diagnosis present

## 2016-08-20 DIAGNOSIS — E785 Hyperlipidemia, unspecified: Secondary | ICD-10-CM | POA: Diagnosis not present

## 2016-08-20 DIAGNOSIS — L97512 Non-pressure chronic ulcer of other part of right foot with fat layer exposed: Secondary | ICD-10-CM | POA: Diagnosis not present

## 2016-08-20 DIAGNOSIS — I6523 Occlusion and stenosis of bilateral carotid arteries: Secondary | ICD-10-CM | POA: Diagnosis not present

## 2016-08-20 DIAGNOSIS — Z8249 Family history of ischemic heart disease and other diseases of the circulatory system: Secondary | ICD-10-CM | POA: Diagnosis not present

## 2016-08-20 DIAGNOSIS — Z7982 Long term (current) use of aspirin: Secondary | ICD-10-CM | POA: Diagnosis not present

## 2016-08-20 DIAGNOSIS — E11621 Type 2 diabetes mellitus with foot ulcer: Secondary | ICD-10-CM | POA: Diagnosis not present

## 2016-08-20 DIAGNOSIS — J449 Chronic obstructive pulmonary disease, unspecified: Secondary | ICD-10-CM | POA: Diagnosis not present

## 2016-08-20 DIAGNOSIS — Z452 Encounter for adjustment and management of vascular access device: Secondary | ICD-10-CM | POA: Diagnosis not present

## 2016-08-20 DIAGNOSIS — I25118 Atherosclerotic heart disease of native coronary artery with other forms of angina pectoris: Secondary | ICD-10-CM | POA: Diagnosis not present

## 2016-08-20 DIAGNOSIS — A499 Bacterial infection, unspecified: Secondary | ICD-10-CM | POA: Diagnosis not present

## 2016-08-20 DIAGNOSIS — I517 Cardiomegaly: Secondary | ICD-10-CM | POA: Diagnosis not present

## 2016-08-20 DIAGNOSIS — L03115 Cellulitis of right lower limb: Secondary | ICD-10-CM | POA: Diagnosis not present

## 2016-08-20 DIAGNOSIS — L97519 Non-pressure chronic ulcer of other part of right foot with unspecified severity: Secondary | ICD-10-CM | POA: Diagnosis not present

## 2016-08-20 DIAGNOSIS — J9600 Acute respiratory failure, unspecified whether with hypoxia or hypercapnia: Secondary | ICD-10-CM | POA: Diagnosis not present

## 2016-08-20 DIAGNOSIS — I249 Acute ischemic heart disease, unspecified: Secondary | ICD-10-CM | POA: Diagnosis not present

## 2016-08-20 DIAGNOSIS — E7800 Pure hypercholesterolemia, unspecified: Secondary | ICD-10-CM | POA: Diagnosis present

## 2016-08-20 DIAGNOSIS — Z880 Allergy status to penicillin: Secondary | ICD-10-CM | POA: Diagnosis not present

## 2016-08-20 DIAGNOSIS — Z22330 Carrier of Group B streptococcus: Secondary | ICD-10-CM | POA: Diagnosis not present

## 2016-08-20 DIAGNOSIS — B9689 Other specified bacterial agents as the cause of diseases classified elsewhere: Secondary | ICD-10-CM | POA: Diagnosis not present

## 2016-08-20 DIAGNOSIS — I2510 Atherosclerotic heart disease of native coronary artery without angina pectoris: Secondary | ICD-10-CM | POA: Diagnosis not present

## 2016-08-20 DIAGNOSIS — Z9119 Patient's noncompliance with other medical treatment and regimen: Secondary | ICD-10-CM | POA: Diagnosis not present

## 2016-08-20 DIAGNOSIS — I252 Old myocardial infarction: Secondary | ICD-10-CM | POA: Diagnosis not present

## 2016-08-20 DIAGNOSIS — J9601 Acute respiratory failure with hypoxia: Secondary | ICD-10-CM | POA: Diagnosis not present

## 2016-08-20 DIAGNOSIS — R234 Changes in skin texture: Secondary | ICD-10-CM | POA: Diagnosis not present

## 2016-08-20 DIAGNOSIS — I501 Left ventricular failure: Secondary | ICD-10-CM | POA: Diagnosis not present

## 2016-08-20 DIAGNOSIS — R262 Difficulty in walking, not elsewhere classified: Secondary | ICD-10-CM | POA: Diagnosis not present

## 2016-08-20 DIAGNOSIS — I214 Non-ST elevation (NSTEMI) myocardial infarction: Secondary | ICD-10-CM | POA: Diagnosis not present

## 2016-08-20 DIAGNOSIS — I5022 Chronic systolic (congestive) heart failure: Secondary | ICD-10-CM | POA: Diagnosis not present

## 2016-08-20 DIAGNOSIS — A4101 Sepsis due to Methicillin susceptible Staphylococcus aureus: Secondary | ICD-10-CM | POA: Diagnosis not present

## 2016-08-20 DIAGNOSIS — I5020 Unspecified systolic (congestive) heart failure: Secondary | ICD-10-CM | POA: Diagnosis not present

## 2016-08-20 DIAGNOSIS — M86671 Other chronic osteomyelitis, right ankle and foot: Secondary | ICD-10-CM | POA: Diagnosis not present

## 2016-08-20 DIAGNOSIS — Z79899 Other long term (current) drug therapy: Secondary | ICD-10-CM | POA: Diagnosis not present

## 2016-08-20 DIAGNOSIS — E119 Type 2 diabetes mellitus without complications: Secondary | ICD-10-CM | POA: Diagnosis not present

## 2016-08-20 DIAGNOSIS — I351 Nonrheumatic aortic (valve) insufficiency: Secondary | ICD-10-CM | POA: Diagnosis present

## 2016-08-20 DIAGNOSIS — R0902 Hypoxemia: Secondary | ICD-10-CM | POA: Diagnosis not present

## 2016-08-20 DIAGNOSIS — E1140 Type 2 diabetes mellitus with diabetic neuropathy, unspecified: Secondary | ICD-10-CM | POA: Diagnosis not present

## 2016-08-20 DIAGNOSIS — R509 Fever, unspecified: Secondary | ICD-10-CM | POA: Diagnosis not present

## 2016-08-20 DIAGNOSIS — M86171 Other acute osteomyelitis, right ankle and foot: Secondary | ICD-10-CM | POA: Diagnosis not present

## 2016-08-20 DIAGNOSIS — I110 Hypertensive heart disease with heart failure: Secondary | ICD-10-CM | POA: Diagnosis present

## 2016-08-20 DIAGNOSIS — R6520 Severe sepsis without septic shock: Secondary | ICD-10-CM | POA: Diagnosis not present

## 2016-08-20 DIAGNOSIS — I10 Essential (primary) hypertension: Secondary | ICD-10-CM | POA: Diagnosis not present

## 2016-08-20 DIAGNOSIS — F17200 Nicotine dependence, unspecified, uncomplicated: Secondary | ICD-10-CM | POA: Diagnosis not present

## 2016-08-20 DIAGNOSIS — J441 Chronic obstructive pulmonary disease with (acute) exacerbation: Secondary | ICD-10-CM | POA: Diagnosis not present

## 2016-08-20 DIAGNOSIS — I50813 Acute on chronic right heart failure: Secondary | ICD-10-CM | POA: Diagnosis not present

## 2016-08-20 DIAGNOSIS — L97509 Non-pressure chronic ulcer of other part of unspecified foot with unspecified severity: Secondary | ICD-10-CM | POA: Diagnosis not present

## 2016-08-20 DIAGNOSIS — D72829 Elevated white blood cell count, unspecified: Secondary | ICD-10-CM | POA: Diagnosis not present

## 2016-08-20 DIAGNOSIS — R0602 Shortness of breath: Secondary | ICD-10-CM | POA: Diagnosis not present

## 2016-08-21 DIAGNOSIS — R234 Changes in skin texture: Secondary | ICD-10-CM | POA: Insufficient documentation

## 2016-08-21 HISTORY — DX: Changes in skin texture: R23.4

## 2016-08-30 DIAGNOSIS — I214 Non-ST elevation (NSTEMI) myocardial infarction: Secondary | ICD-10-CM

## 2016-08-30 HISTORY — DX: Non-ST elevation (NSTEMI) myocardial infarction: I21.4

## 2016-09-02 DIAGNOSIS — L97512 Non-pressure chronic ulcer of other part of right foot with fat layer exposed: Secondary | ICD-10-CM | POA: Diagnosis not present

## 2016-09-02 DIAGNOSIS — R2689 Other abnormalities of gait and mobility: Secondary | ICD-10-CM | POA: Diagnosis not present

## 2016-09-02 DIAGNOSIS — I509 Heart failure, unspecified: Secondary | ICD-10-CM | POA: Diagnosis not present

## 2016-09-02 DIAGNOSIS — D72829 Elevated white blood cell count, unspecified: Secondary | ICD-10-CM | POA: Diagnosis not present

## 2016-09-02 DIAGNOSIS — Z9119 Patient's noncompliance with other medical treatment and regimen: Secondary | ICD-10-CM | POA: Diagnosis not present

## 2016-09-02 DIAGNOSIS — D518 Other vitamin B12 deficiency anemias: Secondary | ICD-10-CM | POA: Diagnosis not present

## 2016-09-02 DIAGNOSIS — I252 Old myocardial infarction: Secondary | ICD-10-CM | POA: Diagnosis not present

## 2016-09-02 DIAGNOSIS — J449 Chronic obstructive pulmonary disease, unspecified: Secondary | ICD-10-CM | POA: Diagnosis not present

## 2016-09-02 DIAGNOSIS — E1140 Type 2 diabetes mellitus with diabetic neuropathy, unspecified: Secondary | ICD-10-CM | POA: Diagnosis not present

## 2016-09-02 DIAGNOSIS — Z8673 Personal history of transient ischemic attack (TIA), and cerebral infarction without residual deficits: Secondary | ICD-10-CM | POA: Diagnosis not present

## 2016-09-02 DIAGNOSIS — Z79899 Other long term (current) drug therapy: Secondary | ICD-10-CM | POA: Diagnosis not present

## 2016-09-02 DIAGNOSIS — E7800 Pure hypercholesterolemia, unspecified: Secondary | ICD-10-CM | POA: Diagnosis not present

## 2016-09-02 DIAGNOSIS — I10 Essential (primary) hypertension: Secondary | ICD-10-CM | POA: Diagnosis not present

## 2016-09-02 DIAGNOSIS — M86671 Other chronic osteomyelitis, right ankle and foot: Secondary | ICD-10-CM | POA: Diagnosis not present

## 2016-09-02 DIAGNOSIS — R262 Difficulty in walking, not elsewhere classified: Secondary | ICD-10-CM | POA: Diagnosis not present

## 2016-09-02 DIAGNOSIS — I420 Dilated cardiomyopathy: Secondary | ICD-10-CM | POA: Diagnosis not present

## 2016-09-02 DIAGNOSIS — Z720 Tobacco use: Secondary | ICD-10-CM | POA: Diagnosis not present

## 2016-09-02 DIAGNOSIS — R0602 Shortness of breath: Secondary | ICD-10-CM | POA: Diagnosis not present

## 2016-09-02 DIAGNOSIS — R509 Fever, unspecified: Secondary | ICD-10-CM | POA: Diagnosis not present

## 2016-09-02 DIAGNOSIS — Z7982 Long term (current) use of aspirin: Secondary | ICD-10-CM | POA: Diagnosis not present

## 2016-09-02 DIAGNOSIS — F17210 Nicotine dependence, cigarettes, uncomplicated: Secondary | ICD-10-CM | POA: Diagnosis not present

## 2016-09-02 DIAGNOSIS — E1142 Type 2 diabetes mellitus with diabetic polyneuropathy: Secondary | ICD-10-CM | POA: Diagnosis not present

## 2016-09-02 DIAGNOSIS — L97409 Non-pressure chronic ulcer of unspecified heel and midfoot with unspecified severity: Secondary | ICD-10-CM | POA: Diagnosis not present

## 2016-09-02 DIAGNOSIS — M869 Osteomyelitis, unspecified: Secondary | ICD-10-CM | POA: Diagnosis not present

## 2016-09-02 DIAGNOSIS — A499 Bacterial infection, unspecified: Secondary | ICD-10-CM | POA: Diagnosis not present

## 2016-09-02 DIAGNOSIS — M6281 Muscle weakness (generalized): Secondary | ICD-10-CM | POA: Diagnosis not present

## 2016-09-02 DIAGNOSIS — L97509 Non-pressure chronic ulcer of other part of unspecified foot with unspecified severity: Secondary | ICD-10-CM | POA: Diagnosis not present

## 2016-09-02 DIAGNOSIS — I5043 Acute on chronic combined systolic (congestive) and diastolic (congestive) heart failure: Secondary | ICD-10-CM | POA: Diagnosis not present

## 2016-09-02 DIAGNOSIS — I214 Non-ST elevation (NSTEMI) myocardial infarction: Secondary | ICD-10-CM | POA: Diagnosis not present

## 2016-09-02 DIAGNOSIS — E11621 Type 2 diabetes mellitus with foot ulcer: Secondary | ICD-10-CM | POA: Diagnosis not present

## 2016-09-02 DIAGNOSIS — E11610 Type 2 diabetes mellitus with diabetic neuropathic arthropathy: Secondary | ICD-10-CM | POA: Diagnosis not present

## 2016-09-02 DIAGNOSIS — Z7984 Long term (current) use of oral hypoglycemic drugs: Secondary | ICD-10-CM | POA: Diagnosis not present

## 2016-09-02 DIAGNOSIS — M2550 Pain in unspecified joint: Secondary | ICD-10-CM | POA: Diagnosis not present

## 2016-09-02 DIAGNOSIS — L03115 Cellulitis of right lower limb: Secondary | ICD-10-CM | POA: Diagnosis not present

## 2016-09-02 DIAGNOSIS — A491 Streptococcal infection, unspecified site: Secondary | ICD-10-CM | POA: Diagnosis not present

## 2016-09-02 DIAGNOSIS — D649 Anemia, unspecified: Secondary | ICD-10-CM | POA: Diagnosis not present

## 2016-09-02 DIAGNOSIS — I50813 Acute on chronic right heart failure: Secondary | ICD-10-CM | POA: Diagnosis not present

## 2016-09-02 DIAGNOSIS — E782 Mixed hyperlipidemia: Secondary | ICD-10-CM | POA: Diagnosis not present

## 2016-09-02 DIAGNOSIS — I2510 Atherosclerotic heart disease of native coronary artery without angina pectoris: Secondary | ICD-10-CM | POA: Diagnosis not present

## 2016-09-02 DIAGNOSIS — F17200 Nicotine dependence, unspecified, uncomplicated: Secondary | ICD-10-CM | POA: Diagnosis not present

## 2016-09-02 DIAGNOSIS — E784 Other hyperlipidemia: Secondary | ICD-10-CM | POA: Diagnosis not present

## 2016-09-02 DIAGNOSIS — E785 Hyperlipidemia, unspecified: Secondary | ICD-10-CM | POA: Diagnosis not present

## 2016-09-02 DIAGNOSIS — A4901 Methicillin susceptible Staphylococcus aureus infection, unspecified site: Secondary | ICD-10-CM | POA: Diagnosis not present

## 2016-09-02 DIAGNOSIS — E1165 Type 2 diabetes mellitus with hyperglycemia: Secondary | ICD-10-CM | POA: Diagnosis not present

## 2016-09-02 DIAGNOSIS — E119 Type 2 diabetes mellitus without complications: Secondary | ICD-10-CM | POA: Diagnosis not present

## 2016-09-02 DIAGNOSIS — I429 Cardiomyopathy, unspecified: Secondary | ICD-10-CM | POA: Diagnosis not present

## 2016-09-02 DIAGNOSIS — Z741 Need for assistance with personal care: Secondary | ICD-10-CM | POA: Diagnosis not present

## 2016-09-03 DIAGNOSIS — M869 Osteomyelitis, unspecified: Secondary | ICD-10-CM | POA: Diagnosis not present

## 2016-09-03 DIAGNOSIS — J449 Chronic obstructive pulmonary disease, unspecified: Secondary | ICD-10-CM | POA: Diagnosis not present

## 2016-09-03 DIAGNOSIS — E119 Type 2 diabetes mellitus without complications: Secondary | ICD-10-CM | POA: Diagnosis not present

## 2016-09-03 DIAGNOSIS — I10 Essential (primary) hypertension: Secondary | ICD-10-CM | POA: Diagnosis not present

## 2016-09-10 DIAGNOSIS — D518 Other vitamin B12 deficiency anemias: Secondary | ICD-10-CM | POA: Diagnosis not present

## 2016-09-10 DIAGNOSIS — E782 Mixed hyperlipidemia: Secondary | ICD-10-CM | POA: Diagnosis not present

## 2016-09-10 DIAGNOSIS — E119 Type 2 diabetes mellitus without complications: Secondary | ICD-10-CM | POA: Diagnosis not present

## 2016-09-10 DIAGNOSIS — Z79899 Other long term (current) drug therapy: Secondary | ICD-10-CM | POA: Diagnosis not present

## 2016-09-11 DIAGNOSIS — R0602 Shortness of breath: Secondary | ICD-10-CM | POA: Diagnosis not present

## 2016-09-11 DIAGNOSIS — E11621 Type 2 diabetes mellitus with foot ulcer: Secondary | ICD-10-CM | POA: Diagnosis not present

## 2016-09-11 DIAGNOSIS — L97509 Non-pressure chronic ulcer of other part of unspecified foot with unspecified severity: Secondary | ICD-10-CM | POA: Diagnosis not present

## 2016-09-11 DIAGNOSIS — L97512 Non-pressure chronic ulcer of other part of right foot with fat layer exposed: Secondary | ICD-10-CM | POA: Diagnosis not present

## 2016-09-11 DIAGNOSIS — E11610 Type 2 diabetes mellitus with diabetic neuropathic arthropathy: Secondary | ICD-10-CM | POA: Diagnosis not present

## 2016-09-13 DIAGNOSIS — M869 Osteomyelitis, unspecified: Secondary | ICD-10-CM | POA: Diagnosis not present

## 2016-09-13 DIAGNOSIS — E119 Type 2 diabetes mellitus without complications: Secondary | ICD-10-CM | POA: Diagnosis not present

## 2016-09-13 DIAGNOSIS — J449 Chronic obstructive pulmonary disease, unspecified: Secondary | ICD-10-CM | POA: Diagnosis not present

## 2016-09-13 DIAGNOSIS — I10 Essential (primary) hypertension: Secondary | ICD-10-CM | POA: Diagnosis not present

## 2016-09-19 DIAGNOSIS — I2510 Atherosclerotic heart disease of native coronary artery without angina pectoris: Secondary | ICD-10-CM | POA: Diagnosis not present

## 2016-09-20 DIAGNOSIS — E11610 Type 2 diabetes mellitus with diabetic neuropathic arthropathy: Secondary | ICD-10-CM | POA: Diagnosis not present

## 2016-09-20 DIAGNOSIS — I429 Cardiomyopathy, unspecified: Secondary | ICD-10-CM | POA: Diagnosis not present

## 2016-09-20 DIAGNOSIS — M869 Osteomyelitis, unspecified: Secondary | ICD-10-CM | POA: Diagnosis not present

## 2016-09-20 DIAGNOSIS — E11621 Type 2 diabetes mellitus with foot ulcer: Secondary | ICD-10-CM | POA: Diagnosis not present

## 2016-09-20 DIAGNOSIS — L97512 Non-pressure chronic ulcer of other part of right foot with fat layer exposed: Secondary | ICD-10-CM | POA: Diagnosis not present

## 2016-09-20 DIAGNOSIS — J449 Chronic obstructive pulmonary disease, unspecified: Secondary | ICD-10-CM | POA: Diagnosis not present

## 2016-09-20 DIAGNOSIS — I10 Essential (primary) hypertension: Secondary | ICD-10-CM | POA: Diagnosis not present

## 2016-09-20 DIAGNOSIS — E119 Type 2 diabetes mellitus without complications: Secondary | ICD-10-CM | POA: Diagnosis not present

## 2016-09-23 DIAGNOSIS — Z0000 Encounter for general adult medical examination without abnormal findings: Secondary | ICD-10-CM | POA: Diagnosis not present

## 2016-09-23 DIAGNOSIS — L97519 Non-pressure chronic ulcer of other part of right foot with unspecified severity: Secondary | ICD-10-CM | POA: Diagnosis not present

## 2016-09-23 DIAGNOSIS — E1142 Type 2 diabetes mellitus with diabetic polyneuropathy: Secondary | ICD-10-CM | POA: Diagnosis not present

## 2016-09-23 DIAGNOSIS — E11621 Type 2 diabetes mellitus with foot ulcer: Secondary | ICD-10-CM | POA: Diagnosis not present

## 2016-09-23 DIAGNOSIS — Z9181 History of falling: Secondary | ICD-10-CM | POA: Diagnosis not present

## 2016-09-23 DIAGNOSIS — Z23 Encounter for immunization: Secondary | ICD-10-CM | POA: Diagnosis not present

## 2016-09-23 DIAGNOSIS — A4901 Methicillin susceptible Staphylococcus aureus infection, unspecified site: Secondary | ICD-10-CM | POA: Diagnosis not present

## 2016-09-23 DIAGNOSIS — E1165 Type 2 diabetes mellitus with hyperglycemia: Secondary | ICD-10-CM | POA: Diagnosis not present

## 2016-09-26 DIAGNOSIS — F17200 Nicotine dependence, unspecified, uncomplicated: Secondary | ICD-10-CM | POA: Diagnosis not present

## 2016-09-26 DIAGNOSIS — Z79899 Other long term (current) drug therapy: Secondary | ICD-10-CM | POA: Diagnosis not present

## 2016-09-26 DIAGNOSIS — I2510 Atherosclerotic heart disease of native coronary artery without angina pectoris: Secondary | ICD-10-CM | POA: Diagnosis not present

## 2016-09-26 DIAGNOSIS — Z8249 Family history of ischemic heart disease and other diseases of the circulatory system: Secondary | ICD-10-CM | POA: Diagnosis not present

## 2016-09-26 DIAGNOSIS — I214 Non-ST elevation (NSTEMI) myocardial infarction: Secondary | ICD-10-CM | POA: Diagnosis not present

## 2016-09-26 DIAGNOSIS — E119 Type 2 diabetes mellitus without complications: Secondary | ICD-10-CM | POA: Diagnosis not present

## 2016-09-26 DIAGNOSIS — E785 Hyperlipidemia, unspecified: Secondary | ICD-10-CM | POA: Diagnosis not present

## 2016-09-26 DIAGNOSIS — E11621 Type 2 diabetes mellitus with foot ulcer: Secondary | ICD-10-CM | POA: Diagnosis not present

## 2016-09-26 DIAGNOSIS — J449 Chronic obstructive pulmonary disease, unspecified: Secondary | ICD-10-CM | POA: Diagnosis not present

## 2016-09-26 DIAGNOSIS — I420 Dilated cardiomyopathy: Secondary | ICD-10-CM | POA: Diagnosis not present

## 2016-09-26 DIAGNOSIS — I25118 Atherosclerotic heart disease of native coronary artery with other forms of angina pectoris: Secondary | ICD-10-CM | POA: Diagnosis not present

## 2016-09-27 DIAGNOSIS — E784 Other hyperlipidemia: Secondary | ICD-10-CM | POA: Diagnosis not present

## 2016-09-27 DIAGNOSIS — E11621 Type 2 diabetes mellitus with foot ulcer: Secondary | ICD-10-CM | POA: Diagnosis not present

## 2016-09-27 DIAGNOSIS — I499 Cardiac arrhythmia, unspecified: Secondary | ICD-10-CM | POA: Diagnosis not present

## 2016-09-27 DIAGNOSIS — E119 Type 2 diabetes mellitus without complications: Secondary | ICD-10-CM | POA: Diagnosis not present

## 2016-09-27 DIAGNOSIS — I420 Dilated cardiomyopathy: Secondary | ICD-10-CM | POA: Diagnosis not present

## 2016-09-27 DIAGNOSIS — I2510 Atherosclerotic heart disease of native coronary artery without angina pectoris: Secondary | ICD-10-CM | POA: Diagnosis not present

## 2016-09-27 DIAGNOSIS — J449 Chronic obstructive pulmonary disease, unspecified: Secondary | ICD-10-CM | POA: Diagnosis not present

## 2016-09-27 DIAGNOSIS — L97909 Non-pressure chronic ulcer of unspecified part of unspecified lower leg with unspecified severity: Secondary | ICD-10-CM | POA: Diagnosis not present

## 2016-09-27 DIAGNOSIS — F17210 Nicotine dependence, cigarettes, uncomplicated: Secondary | ICD-10-CM | POA: Diagnosis not present

## 2016-09-27 DIAGNOSIS — Z955 Presence of coronary angioplasty implant and graft: Secondary | ICD-10-CM | POA: Diagnosis not present

## 2016-09-27 DIAGNOSIS — E785 Hyperlipidemia, unspecified: Secondary | ICD-10-CM | POA: Diagnosis not present

## 2016-09-27 DIAGNOSIS — I25118 Atherosclerotic heart disease of native coronary artery with other forms of angina pectoris: Secondary | ICD-10-CM | POA: Diagnosis not present

## 2016-10-01 DIAGNOSIS — M1990 Unspecified osteoarthritis, unspecified site: Secondary | ICD-10-CM | POA: Diagnosis not present

## 2016-10-01 DIAGNOSIS — E11621 Type 2 diabetes mellitus with foot ulcer: Secondary | ICD-10-CM | POA: Diagnosis not present

## 2016-10-01 DIAGNOSIS — I10 Essential (primary) hypertension: Secondary | ICD-10-CM | POA: Diagnosis not present

## 2016-10-01 DIAGNOSIS — G4730 Sleep apnea, unspecified: Secondary | ICD-10-CM | POA: Diagnosis not present

## 2016-10-01 DIAGNOSIS — I252 Old myocardial infarction: Secondary | ICD-10-CM | POA: Diagnosis not present

## 2016-10-01 DIAGNOSIS — J449 Chronic obstructive pulmonary disease, unspecified: Secondary | ICD-10-CM | POA: Diagnosis not present

## 2016-10-01 DIAGNOSIS — G629 Polyneuropathy, unspecified: Secondary | ICD-10-CM | POA: Diagnosis not present

## 2016-10-01 DIAGNOSIS — L97512 Non-pressure chronic ulcer of other part of right foot with fat layer exposed: Secondary | ICD-10-CM | POA: Diagnosis not present

## 2016-10-01 DIAGNOSIS — L97411 Non-pressure chronic ulcer of right heel and midfoot limited to breakdown of skin: Secondary | ICD-10-CM | POA: Diagnosis not present

## 2016-10-09 DIAGNOSIS — L97512 Non-pressure chronic ulcer of other part of right foot with fat layer exposed: Secondary | ICD-10-CM | POA: Diagnosis not present

## 2016-10-09 DIAGNOSIS — E11621 Type 2 diabetes mellitus with foot ulcer: Secondary | ICD-10-CM | POA: Diagnosis not present

## 2016-10-09 DIAGNOSIS — L97412 Non-pressure chronic ulcer of right heel and midfoot with fat layer exposed: Secondary | ICD-10-CM | POA: Diagnosis not present

## 2016-10-15 DIAGNOSIS — I208 Other forms of angina pectoris: Secondary | ICD-10-CM | POA: Diagnosis not present

## 2016-10-15 DIAGNOSIS — Z955 Presence of coronary angioplasty implant and graft: Secondary | ICD-10-CM | POA: Diagnosis not present

## 2016-10-15 DIAGNOSIS — E11621 Type 2 diabetes mellitus with foot ulcer: Secondary | ICD-10-CM | POA: Diagnosis not present

## 2016-10-16 DIAGNOSIS — Z955 Presence of coronary angioplasty implant and graft: Secondary | ICD-10-CM | POA: Diagnosis not present

## 2016-10-16 DIAGNOSIS — I208 Other forms of angina pectoris: Secondary | ICD-10-CM | POA: Diagnosis not present

## 2016-10-16 DIAGNOSIS — E11621 Type 2 diabetes mellitus with foot ulcer: Secondary | ICD-10-CM | POA: Diagnosis not present

## 2016-10-17 DIAGNOSIS — S93141A Subluxation of metatarsophalangeal joint of right great toe, initial encounter: Secondary | ICD-10-CM | POA: Diagnosis not present

## 2016-10-17 DIAGNOSIS — I208 Other forms of angina pectoris: Secondary | ICD-10-CM | POA: Diagnosis not present

## 2016-10-17 DIAGNOSIS — L97512 Non-pressure chronic ulcer of other part of right foot with fat layer exposed: Secondary | ICD-10-CM | POA: Diagnosis not present

## 2016-10-17 DIAGNOSIS — E11621 Type 2 diabetes mellitus with foot ulcer: Secondary | ICD-10-CM | POA: Diagnosis not present

## 2016-10-17 DIAGNOSIS — Z955 Presence of coronary angioplasty implant and graft: Secondary | ICD-10-CM | POA: Diagnosis not present

## 2016-10-21 DIAGNOSIS — I208 Other forms of angina pectoris: Secondary | ICD-10-CM | POA: Diagnosis not present

## 2016-10-21 DIAGNOSIS — Z955 Presence of coronary angioplasty implant and graft: Secondary | ICD-10-CM | POA: Diagnosis not present

## 2016-10-21 DIAGNOSIS — E11621 Type 2 diabetes mellitus with foot ulcer: Secondary | ICD-10-CM | POA: Diagnosis not present

## 2016-10-23 DIAGNOSIS — Z955 Presence of coronary angioplasty implant and graft: Secondary | ICD-10-CM | POA: Diagnosis not present

## 2016-10-23 DIAGNOSIS — E11621 Type 2 diabetes mellitus with foot ulcer: Secondary | ICD-10-CM | POA: Diagnosis not present

## 2016-10-23 DIAGNOSIS — I208 Other forms of angina pectoris: Secondary | ICD-10-CM | POA: Diagnosis not present

## 2016-10-24 DIAGNOSIS — L97412 Non-pressure chronic ulcer of right heel and midfoot with fat layer exposed: Secondary | ICD-10-CM | POA: Diagnosis not present

## 2016-10-24 DIAGNOSIS — E11621 Type 2 diabetes mellitus with foot ulcer: Secondary | ICD-10-CM | POA: Diagnosis not present

## 2016-10-24 DIAGNOSIS — L97512 Non-pressure chronic ulcer of other part of right foot with fat layer exposed: Secondary | ICD-10-CM | POA: Diagnosis not present

## 2016-10-28 DIAGNOSIS — E11621 Type 2 diabetes mellitus with foot ulcer: Secondary | ICD-10-CM | POA: Diagnosis not present

## 2016-10-28 DIAGNOSIS — I208 Other forms of angina pectoris: Secondary | ICD-10-CM | POA: Diagnosis not present

## 2016-10-28 DIAGNOSIS — Z955 Presence of coronary angioplasty implant and graft: Secondary | ICD-10-CM | POA: Diagnosis not present

## 2016-10-30 DIAGNOSIS — I208 Other forms of angina pectoris: Secondary | ICD-10-CM | POA: Diagnosis not present

## 2016-10-30 DIAGNOSIS — Z955 Presence of coronary angioplasty implant and graft: Secondary | ICD-10-CM | POA: Diagnosis not present

## 2016-10-30 DIAGNOSIS — E11621 Type 2 diabetes mellitus with foot ulcer: Secondary | ICD-10-CM | POA: Diagnosis not present

## 2016-10-31 DIAGNOSIS — E11621 Type 2 diabetes mellitus with foot ulcer: Secondary | ICD-10-CM | POA: Diagnosis not present

## 2016-10-31 DIAGNOSIS — L84 Corns and callosities: Secondary | ICD-10-CM | POA: Diagnosis not present

## 2016-10-31 DIAGNOSIS — L97412 Non-pressure chronic ulcer of right heel and midfoot with fat layer exposed: Secondary | ICD-10-CM | POA: Diagnosis not present

## 2016-10-31 DIAGNOSIS — L97512 Non-pressure chronic ulcer of other part of right foot with fat layer exposed: Secondary | ICD-10-CM | POA: Diagnosis not present

## 2016-11-06 DIAGNOSIS — I208 Other forms of angina pectoris: Secondary | ICD-10-CM | POA: Diagnosis not present

## 2016-11-06 DIAGNOSIS — E11621 Type 2 diabetes mellitus with foot ulcer: Secondary | ICD-10-CM | POA: Diagnosis not present

## 2016-11-06 DIAGNOSIS — Z955 Presence of coronary angioplasty implant and graft: Secondary | ICD-10-CM | POA: Diagnosis not present

## 2016-11-07 DIAGNOSIS — L84 Corns and callosities: Secondary | ICD-10-CM | POA: Diagnosis not present

## 2016-11-07 DIAGNOSIS — L97512 Non-pressure chronic ulcer of other part of right foot with fat layer exposed: Secondary | ICD-10-CM | POA: Diagnosis not present

## 2016-11-07 DIAGNOSIS — L97412 Non-pressure chronic ulcer of right heel and midfoot with fat layer exposed: Secondary | ICD-10-CM | POA: Diagnosis not present

## 2016-11-07 DIAGNOSIS — F17210 Nicotine dependence, cigarettes, uncomplicated: Secondary | ICD-10-CM | POA: Diagnosis not present

## 2016-11-07 DIAGNOSIS — E11621 Type 2 diabetes mellitus with foot ulcer: Secondary | ICD-10-CM | POA: Diagnosis not present

## 2016-11-11 DIAGNOSIS — I208 Other forms of angina pectoris: Secondary | ICD-10-CM | POA: Diagnosis not present

## 2016-11-11 DIAGNOSIS — Z955 Presence of coronary angioplasty implant and graft: Secondary | ICD-10-CM | POA: Diagnosis not present

## 2016-11-11 DIAGNOSIS — E11621 Type 2 diabetes mellitus with foot ulcer: Secondary | ICD-10-CM | POA: Diagnosis not present

## 2016-11-14 DIAGNOSIS — L84 Corns and callosities: Secondary | ICD-10-CM | POA: Diagnosis not present

## 2016-11-14 DIAGNOSIS — E11621 Type 2 diabetes mellitus with foot ulcer: Secondary | ICD-10-CM | POA: Diagnosis not present

## 2016-11-14 DIAGNOSIS — L97512 Non-pressure chronic ulcer of other part of right foot with fat layer exposed: Secondary | ICD-10-CM | POA: Diagnosis not present

## 2016-11-14 DIAGNOSIS — L97412 Non-pressure chronic ulcer of right heel and midfoot with fat layer exposed: Secondary | ICD-10-CM | POA: Diagnosis not present

## 2016-11-21 DIAGNOSIS — L97512 Non-pressure chronic ulcer of other part of right foot with fat layer exposed: Secondary | ICD-10-CM | POA: Diagnosis not present

## 2016-11-21 DIAGNOSIS — L97412 Non-pressure chronic ulcer of right heel and midfoot with fat layer exposed: Secondary | ICD-10-CM | POA: Diagnosis not present

## 2016-11-21 DIAGNOSIS — E11621 Type 2 diabetes mellitus with foot ulcer: Secondary | ICD-10-CM | POA: Diagnosis not present

## 2016-11-28 DIAGNOSIS — L97512 Non-pressure chronic ulcer of other part of right foot with fat layer exposed: Secondary | ICD-10-CM | POA: Diagnosis not present

## 2016-11-28 DIAGNOSIS — L97412 Non-pressure chronic ulcer of right heel and midfoot with fat layer exposed: Secondary | ICD-10-CM | POA: Diagnosis not present

## 2016-11-28 DIAGNOSIS — E11621 Type 2 diabetes mellitus with foot ulcer: Secondary | ICD-10-CM | POA: Diagnosis not present

## 2016-12-06 DIAGNOSIS — L97512 Non-pressure chronic ulcer of other part of right foot with fat layer exposed: Secondary | ICD-10-CM | POA: Diagnosis not present

## 2016-12-06 DIAGNOSIS — L97412 Non-pressure chronic ulcer of right heel and midfoot with fat layer exposed: Secondary | ICD-10-CM | POA: Diagnosis not present

## 2016-12-06 DIAGNOSIS — E11621 Type 2 diabetes mellitus with foot ulcer: Secondary | ICD-10-CM | POA: Diagnosis not present

## 2016-12-06 DIAGNOSIS — L97419 Non-pressure chronic ulcer of right heel and midfoot with unspecified severity: Secondary | ICD-10-CM | POA: Diagnosis not present

## 2016-12-12 DIAGNOSIS — I509 Heart failure, unspecified: Secondary | ICD-10-CM | POA: Diagnosis not present

## 2016-12-13 DIAGNOSIS — L97412 Non-pressure chronic ulcer of right heel and midfoot with fat layer exposed: Secondary | ICD-10-CM | POA: Diagnosis not present

## 2016-12-13 DIAGNOSIS — L089 Local infection of the skin and subcutaneous tissue, unspecified: Secondary | ICD-10-CM | POA: Diagnosis not present

## 2016-12-13 DIAGNOSIS — L97512 Non-pressure chronic ulcer of other part of right foot with fat layer exposed: Secondary | ICD-10-CM | POA: Diagnosis not present

## 2016-12-13 DIAGNOSIS — E11621 Type 2 diabetes mellitus with foot ulcer: Secondary | ICD-10-CM | POA: Diagnosis not present

## 2016-12-16 DIAGNOSIS — I10 Essential (primary) hypertension: Secondary | ICD-10-CM | POA: Diagnosis not present

## 2016-12-16 DIAGNOSIS — Z8709 Personal history of other diseases of the respiratory system: Secondary | ICD-10-CM | POA: Diagnosis not present

## 2016-12-16 DIAGNOSIS — Z87891 Personal history of nicotine dependence: Secondary | ICD-10-CM | POA: Diagnosis not present

## 2016-12-16 DIAGNOSIS — E11621 Type 2 diabetes mellitus with foot ulcer: Secondary | ICD-10-CM | POA: Diagnosis not present

## 2016-12-17 DIAGNOSIS — I10 Essential (primary) hypertension: Secondary | ICD-10-CM | POA: Diagnosis not present

## 2016-12-18 DIAGNOSIS — L089 Local infection of the skin and subcutaneous tissue, unspecified: Secondary | ICD-10-CM | POA: Diagnosis not present

## 2016-12-18 DIAGNOSIS — L97412 Non-pressure chronic ulcer of right heel and midfoot with fat layer exposed: Secondary | ICD-10-CM | POA: Diagnosis not present

## 2016-12-18 DIAGNOSIS — E11621 Type 2 diabetes mellitus with foot ulcer: Secondary | ICD-10-CM | POA: Diagnosis not present

## 2016-12-20 DIAGNOSIS — L97412 Non-pressure chronic ulcer of right heel and midfoot with fat layer exposed: Secondary | ICD-10-CM | POA: Diagnosis not present

## 2016-12-20 DIAGNOSIS — E11621 Type 2 diabetes mellitus with foot ulcer: Secondary | ICD-10-CM | POA: Diagnosis not present

## 2016-12-20 DIAGNOSIS — L97512 Non-pressure chronic ulcer of other part of right foot with fat layer exposed: Secondary | ICD-10-CM | POA: Diagnosis not present

## 2016-12-23 DIAGNOSIS — L97412 Non-pressure chronic ulcer of right heel and midfoot with fat layer exposed: Secondary | ICD-10-CM | POA: Diagnosis not present

## 2016-12-23 DIAGNOSIS — E11621 Type 2 diabetes mellitus with foot ulcer: Secondary | ICD-10-CM | POA: Diagnosis not present

## 2016-12-25 DIAGNOSIS — L97412 Non-pressure chronic ulcer of right heel and midfoot with fat layer exposed: Secondary | ICD-10-CM | POA: Diagnosis not present

## 2016-12-25 DIAGNOSIS — E11621 Type 2 diabetes mellitus with foot ulcer: Secondary | ICD-10-CM | POA: Diagnosis not present

## 2016-12-26 DIAGNOSIS — R9431 Abnormal electrocardiogram [ECG] [EKG]: Secondary | ICD-10-CM | POA: Diagnosis not present

## 2016-12-27 DIAGNOSIS — L97512 Non-pressure chronic ulcer of other part of right foot with fat layer exposed: Secondary | ICD-10-CM | POA: Diagnosis not present

## 2016-12-27 DIAGNOSIS — E11621 Type 2 diabetes mellitus with foot ulcer: Secondary | ICD-10-CM | POA: Diagnosis not present

## 2016-12-27 DIAGNOSIS — L97412 Non-pressure chronic ulcer of right heel and midfoot with fat layer exposed: Secondary | ICD-10-CM | POA: Diagnosis not present

## 2016-12-30 DIAGNOSIS — E11621 Type 2 diabetes mellitus with foot ulcer: Secondary | ICD-10-CM | POA: Diagnosis not present

## 2016-12-30 DIAGNOSIS — L97412 Non-pressure chronic ulcer of right heel and midfoot with fat layer exposed: Secondary | ICD-10-CM | POA: Diagnosis not present

## 2017-01-01 DIAGNOSIS — L97412 Non-pressure chronic ulcer of right heel and midfoot with fat layer exposed: Secondary | ICD-10-CM | POA: Diagnosis not present

## 2017-01-01 DIAGNOSIS — E11621 Type 2 diabetes mellitus with foot ulcer: Secondary | ICD-10-CM | POA: Diagnosis not present

## 2017-01-03 DIAGNOSIS — L97512 Non-pressure chronic ulcer of other part of right foot with fat layer exposed: Secondary | ICD-10-CM | POA: Diagnosis not present

## 2017-01-03 DIAGNOSIS — E11621 Type 2 diabetes mellitus with foot ulcer: Secondary | ICD-10-CM | POA: Diagnosis not present

## 2017-01-03 DIAGNOSIS — L97412 Non-pressure chronic ulcer of right heel and midfoot with fat layer exposed: Secondary | ICD-10-CM | POA: Diagnosis not present

## 2017-01-06 DIAGNOSIS — L97412 Non-pressure chronic ulcer of right heel and midfoot with fat layer exposed: Secondary | ICD-10-CM | POA: Diagnosis not present

## 2017-01-06 DIAGNOSIS — E11621 Type 2 diabetes mellitus with foot ulcer: Secondary | ICD-10-CM | POA: Diagnosis not present

## 2017-01-08 DIAGNOSIS — E11621 Type 2 diabetes mellitus with foot ulcer: Secondary | ICD-10-CM | POA: Diagnosis not present

## 2017-01-08 DIAGNOSIS — L97412 Non-pressure chronic ulcer of right heel and midfoot with fat layer exposed: Secondary | ICD-10-CM | POA: Diagnosis not present

## 2017-01-10 DIAGNOSIS — L97412 Non-pressure chronic ulcer of right heel and midfoot with fat layer exposed: Secondary | ICD-10-CM | POA: Diagnosis not present

## 2017-01-10 DIAGNOSIS — L97512 Non-pressure chronic ulcer of other part of right foot with fat layer exposed: Secondary | ICD-10-CM | POA: Diagnosis not present

## 2017-01-10 DIAGNOSIS — E11621 Type 2 diabetes mellitus with foot ulcer: Secondary | ICD-10-CM | POA: Diagnosis not present

## 2017-01-14 DIAGNOSIS — E11621 Type 2 diabetes mellitus with foot ulcer: Secondary | ICD-10-CM | POA: Diagnosis not present

## 2017-01-14 DIAGNOSIS — L97412 Non-pressure chronic ulcer of right heel and midfoot with fat layer exposed: Secondary | ICD-10-CM | POA: Diagnosis not present

## 2017-01-15 DIAGNOSIS — E11621 Type 2 diabetes mellitus with foot ulcer: Secondary | ICD-10-CM | POA: Diagnosis not present

## 2017-01-17 DIAGNOSIS — L97412 Non-pressure chronic ulcer of right heel and midfoot with fat layer exposed: Secondary | ICD-10-CM | POA: Diagnosis not present

## 2017-01-17 DIAGNOSIS — L97512 Non-pressure chronic ulcer of other part of right foot with fat layer exposed: Secondary | ICD-10-CM | POA: Diagnosis not present

## 2017-01-17 DIAGNOSIS — E11621 Type 2 diabetes mellitus with foot ulcer: Secondary | ICD-10-CM | POA: Diagnosis not present

## 2017-01-21 DIAGNOSIS — E11621 Type 2 diabetes mellitus with foot ulcer: Secondary | ICD-10-CM | POA: Diagnosis not present

## 2017-01-21 DIAGNOSIS — L97412 Non-pressure chronic ulcer of right heel and midfoot with fat layer exposed: Secondary | ICD-10-CM | POA: Diagnosis not present

## 2017-01-22 DIAGNOSIS — E785 Hyperlipidemia, unspecified: Secondary | ICD-10-CM | POA: Diagnosis not present

## 2017-01-22 DIAGNOSIS — J449 Chronic obstructive pulmonary disease, unspecified: Secondary | ICD-10-CM | POA: Diagnosis not present

## 2017-01-22 DIAGNOSIS — E11621 Type 2 diabetes mellitus with foot ulcer: Secondary | ICD-10-CM | POA: Diagnosis not present

## 2017-01-22 DIAGNOSIS — F17200 Nicotine dependence, unspecified, uncomplicated: Secondary | ICD-10-CM | POA: Diagnosis not present

## 2017-01-22 DIAGNOSIS — I420 Dilated cardiomyopathy: Secondary | ICD-10-CM | POA: Diagnosis not present

## 2017-01-22 DIAGNOSIS — I10 Essential (primary) hypertension: Secondary | ICD-10-CM | POA: Diagnosis not present

## 2017-01-22 DIAGNOSIS — Z87891 Personal history of nicotine dependence: Secondary | ICD-10-CM | POA: Diagnosis not present

## 2017-01-22 DIAGNOSIS — E119 Type 2 diabetes mellitus without complications: Secondary | ICD-10-CM | POA: Diagnosis not present

## 2017-01-22 DIAGNOSIS — L97519 Non-pressure chronic ulcer of other part of right foot with unspecified severity: Secondary | ICD-10-CM | POA: Diagnosis not present

## 2017-01-22 DIAGNOSIS — Z720 Tobacco use: Secondary | ICD-10-CM | POA: Diagnosis not present

## 2017-01-24 DIAGNOSIS — E11621 Type 2 diabetes mellitus with foot ulcer: Secondary | ICD-10-CM | POA: Diagnosis not present

## 2017-01-24 DIAGNOSIS — L97412 Non-pressure chronic ulcer of right heel and midfoot with fat layer exposed: Secondary | ICD-10-CM | POA: Diagnosis not present

## 2017-01-24 DIAGNOSIS — L97512 Non-pressure chronic ulcer of other part of right foot with fat layer exposed: Secondary | ICD-10-CM | POA: Diagnosis not present

## 2017-01-28 DIAGNOSIS — E11621 Type 2 diabetes mellitus with foot ulcer: Secondary | ICD-10-CM | POA: Diagnosis not present

## 2017-01-28 DIAGNOSIS — L97412 Non-pressure chronic ulcer of right heel and midfoot with fat layer exposed: Secondary | ICD-10-CM | POA: Diagnosis not present

## 2017-01-30 DIAGNOSIS — L97512 Non-pressure chronic ulcer of other part of right foot with fat layer exposed: Secondary | ICD-10-CM | POA: Diagnosis not present

## 2017-01-30 DIAGNOSIS — L97412 Non-pressure chronic ulcer of right heel and midfoot with fat layer exposed: Secondary | ICD-10-CM | POA: Diagnosis not present

## 2017-01-30 DIAGNOSIS — E11621 Type 2 diabetes mellitus with foot ulcer: Secondary | ICD-10-CM | POA: Diagnosis not present

## 2017-02-04 DIAGNOSIS — E119 Type 2 diabetes mellitus without complications: Secondary | ICD-10-CM | POA: Diagnosis not present

## 2017-02-04 DIAGNOSIS — Z872 Personal history of diseases of the skin and subcutaneous tissue: Secondary | ICD-10-CM | POA: Diagnosis not present

## 2017-02-04 DIAGNOSIS — Z09 Encounter for follow-up examination after completed treatment for conditions other than malignant neoplasm: Secondary | ICD-10-CM | POA: Diagnosis not present

## 2017-02-04 DIAGNOSIS — E11621 Type 2 diabetes mellitus with foot ulcer: Secondary | ICD-10-CM | POA: Diagnosis not present

## 2017-02-04 DIAGNOSIS — L97519 Non-pressure chronic ulcer of other part of right foot with unspecified severity: Secondary | ICD-10-CM | POA: Diagnosis not present

## 2017-02-04 DIAGNOSIS — Z8631 Personal history of diabetic foot ulcer: Secondary | ICD-10-CM | POA: Diagnosis not present

## 2017-02-17 DIAGNOSIS — J441 Chronic obstructive pulmonary disease with (acute) exacerbation: Secondary | ICD-10-CM | POA: Diagnosis not present

## 2017-02-17 DIAGNOSIS — L97519 Non-pressure chronic ulcer of other part of right foot with unspecified severity: Secondary | ICD-10-CM | POA: Diagnosis not present

## 2017-02-17 DIAGNOSIS — E11621 Type 2 diabetes mellitus with foot ulcer: Secondary | ICD-10-CM | POA: Diagnosis not present

## 2017-02-24 DIAGNOSIS — M21961 Unspecified acquired deformity of right lower leg: Secondary | ICD-10-CM | POA: Diagnosis not present

## 2017-02-24 DIAGNOSIS — M2042 Other hammer toe(s) (acquired), left foot: Secondary | ICD-10-CM | POA: Diagnosis not present

## 2017-02-24 DIAGNOSIS — M21962 Unspecified acquired deformity of left lower leg: Secondary | ICD-10-CM | POA: Diagnosis not present

## 2017-02-24 DIAGNOSIS — M2041 Other hammer toe(s) (acquired), right foot: Secondary | ICD-10-CM | POA: Diagnosis not present

## 2017-02-24 DIAGNOSIS — E1142 Type 2 diabetes mellitus with diabetic polyneuropathy: Secondary | ICD-10-CM | POA: Diagnosis not present

## 2017-02-25 DIAGNOSIS — M2041 Other hammer toe(s) (acquired), right foot: Secondary | ICD-10-CM

## 2017-02-25 DIAGNOSIS — M2042 Other hammer toe(s) (acquired), left foot: Secondary | ICD-10-CM

## 2017-02-25 DIAGNOSIS — E1142 Type 2 diabetes mellitus with diabetic polyneuropathy: Secondary | ICD-10-CM | POA: Insufficient documentation

## 2017-02-25 HISTORY — DX: Type 2 diabetes mellitus with diabetic polyneuropathy: E11.42

## 2017-02-25 HISTORY — DX: Other hammer toe(s) (acquired), right foot: M20.41

## 2017-04-07 DIAGNOSIS — E11621 Type 2 diabetes mellitus with foot ulcer: Secondary | ICD-10-CM | POA: Diagnosis not present

## 2017-05-23 DIAGNOSIS — Z7982 Long term (current) use of aspirin: Secondary | ICD-10-CM | POA: Diagnosis not present

## 2017-05-23 DIAGNOSIS — N492 Inflammatory disorders of scrotum: Secondary | ICD-10-CM | POA: Diagnosis not present

## 2017-05-23 DIAGNOSIS — Z79899 Other long term (current) drug therapy: Secondary | ICD-10-CM | POA: Diagnosis not present

## 2017-05-23 DIAGNOSIS — E1165 Type 2 diabetes mellitus with hyperglycemia: Secondary | ICD-10-CM | POA: Diagnosis not present

## 2017-05-23 DIAGNOSIS — Z8673 Personal history of transient ischemic attack (TIA), and cerebral infarction without residual deficits: Secondary | ICD-10-CM | POA: Diagnosis not present

## 2017-05-23 DIAGNOSIS — M1990 Unspecified osteoarthritis, unspecified site: Secondary | ICD-10-CM | POA: Diagnosis present

## 2017-05-23 DIAGNOSIS — I2510 Atherosclerotic heart disease of native coronary artery without angina pectoris: Secondary | ICD-10-CM | POA: Diagnosis not present

## 2017-05-23 DIAGNOSIS — J449 Chronic obstructive pulmonary disease, unspecified: Secondary | ICD-10-CM | POA: Diagnosis not present

## 2017-05-23 DIAGNOSIS — I252 Old myocardial infarction: Secondary | ICD-10-CM | POA: Diagnosis not present

## 2017-05-23 DIAGNOSIS — E669 Obesity, unspecified: Secondary | ICD-10-CM | POA: Diagnosis present

## 2017-05-23 DIAGNOSIS — Z23 Encounter for immunization: Secondary | ICD-10-CM | POA: Diagnosis not present

## 2017-05-23 DIAGNOSIS — E7800 Pure hypercholesterolemia, unspecified: Secondary | ICD-10-CM | POA: Diagnosis present

## 2017-05-23 DIAGNOSIS — Z955 Presence of coronary angioplasty implant and graft: Secondary | ICD-10-CM | POA: Diagnosis not present

## 2017-05-23 DIAGNOSIS — F17210 Nicotine dependence, cigarettes, uncomplicated: Secondary | ICD-10-CM | POA: Diagnosis not present

## 2017-05-23 DIAGNOSIS — L03315 Cellulitis of perineum: Secondary | ICD-10-CM | POA: Diagnosis not present

## 2017-05-23 DIAGNOSIS — L02215 Cutaneous abscess of perineum: Secondary | ICD-10-CM | POA: Diagnosis not present

## 2017-05-23 DIAGNOSIS — Z6831 Body mass index (BMI) 31.0-31.9, adult: Secondary | ICD-10-CM | POA: Diagnosis not present

## 2017-05-23 DIAGNOSIS — I110 Hypertensive heart disease with heart failure: Secondary | ICD-10-CM | POA: Diagnosis present

## 2017-05-23 DIAGNOSIS — I5042 Chronic combined systolic (congestive) and diastolic (congestive) heart failure: Secondary | ICD-10-CM | POA: Diagnosis not present

## 2017-05-23 DIAGNOSIS — E872 Acidosis: Secondary | ICD-10-CM | POA: Diagnosis not present

## 2017-05-23 DIAGNOSIS — Z880 Allergy status to penicillin: Secondary | ICD-10-CM | POA: Diagnosis not present

## 2017-05-23 DIAGNOSIS — R102 Pelvic and perineal pain: Secondary | ICD-10-CM | POA: Diagnosis not present

## 2017-05-23 DIAGNOSIS — K651 Peritoneal abscess: Secondary | ICD-10-CM | POA: Diagnosis not present

## 2017-05-23 DIAGNOSIS — I429 Cardiomyopathy, unspecified: Secondary | ICD-10-CM | POA: Diagnosis present

## 2017-05-28 DIAGNOSIS — L02215 Cutaneous abscess of perineum: Secondary | ICD-10-CM | POA: Diagnosis not present

## 2017-05-31 DIAGNOSIS — L03315 Cellulitis of perineum: Secondary | ICD-10-CM | POA: Diagnosis not present

## 2017-06-02 DIAGNOSIS — J449 Chronic obstructive pulmonary disease, unspecified: Secondary | ICD-10-CM | POA: Diagnosis not present

## 2017-06-02 DIAGNOSIS — Z7984 Long term (current) use of oral hypoglycemic drugs: Secondary | ICD-10-CM | POA: Diagnosis not present

## 2017-06-02 DIAGNOSIS — Z8673 Personal history of transient ischemic attack (TIA), and cerebral infarction without residual deficits: Secondary | ICD-10-CM | POA: Diagnosis not present

## 2017-06-02 DIAGNOSIS — M1990 Unspecified osteoarthritis, unspecified site: Secondary | ICD-10-CM | POA: Diagnosis not present

## 2017-06-02 DIAGNOSIS — E1140 Type 2 diabetes mellitus with diabetic neuropathy, unspecified: Secondary | ICD-10-CM | POA: Diagnosis not present

## 2017-06-02 DIAGNOSIS — G4730 Sleep apnea, unspecified: Secondary | ICD-10-CM | POA: Diagnosis not present

## 2017-06-02 DIAGNOSIS — I252 Old myocardial infarction: Secondary | ICD-10-CM | POA: Diagnosis not present

## 2017-06-02 DIAGNOSIS — E11621 Type 2 diabetes mellitus with foot ulcer: Secondary | ICD-10-CM | POA: Diagnosis not present

## 2017-06-02 DIAGNOSIS — I509 Heart failure, unspecified: Secondary | ICD-10-CM | POA: Diagnosis not present

## 2017-06-02 DIAGNOSIS — F17200 Nicotine dependence, unspecified, uncomplicated: Secondary | ICD-10-CM | POA: Diagnosis not present

## 2017-06-02 DIAGNOSIS — L97512 Non-pressure chronic ulcer of other part of right foot with fat layer exposed: Secondary | ICD-10-CM | POA: Diagnosis not present

## 2017-06-02 DIAGNOSIS — I110 Hypertensive heart disease with heart failure: Secondary | ICD-10-CM | POA: Diagnosis not present

## 2017-06-02 DIAGNOSIS — L97412 Non-pressure chronic ulcer of right heel and midfoot with fat layer exposed: Secondary | ICD-10-CM | POA: Diagnosis not present

## 2017-06-04 DIAGNOSIS — E11621 Type 2 diabetes mellitus with foot ulcer: Secondary | ICD-10-CM | POA: Diagnosis not present

## 2017-06-04 DIAGNOSIS — L97412 Non-pressure chronic ulcer of right heel and midfoot with fat layer exposed: Secondary | ICD-10-CM | POA: Diagnosis not present

## 2017-06-04 DIAGNOSIS — E1140 Type 2 diabetes mellitus with diabetic neuropathy, unspecified: Secondary | ICD-10-CM | POA: Diagnosis not present

## 2017-06-06 DIAGNOSIS — Z09 Encounter for follow-up examination after completed treatment for conditions other than malignant neoplasm: Secondary | ICD-10-CM | POA: Diagnosis not present

## 2017-06-06 DIAGNOSIS — E1140 Type 2 diabetes mellitus with diabetic neuropathy, unspecified: Secondary | ICD-10-CM | POA: Diagnosis not present

## 2017-06-06 DIAGNOSIS — E11621 Type 2 diabetes mellitus with foot ulcer: Secondary | ICD-10-CM | POA: Diagnosis not present

## 2017-06-06 DIAGNOSIS — L97519 Non-pressure chronic ulcer of other part of right foot with unspecified severity: Secondary | ICD-10-CM | POA: Diagnosis not present

## 2017-06-06 DIAGNOSIS — Z8631 Personal history of diabetic foot ulcer: Secondary | ICD-10-CM | POA: Diagnosis not present

## 2017-06-14 DIAGNOSIS — L03315 Cellulitis of perineum: Secondary | ICD-10-CM | POA: Diagnosis not present

## 2017-06-15 DIAGNOSIS — L03315 Cellulitis of perineum: Secondary | ICD-10-CM | POA: Diagnosis not present

## 2017-06-24 DIAGNOSIS — L02215 Cutaneous abscess of perineum: Secondary | ICD-10-CM | POA: Diagnosis not present

## 2017-07-18 DIAGNOSIS — Z6833 Body mass index (BMI) 33.0-33.9, adult: Secondary | ICD-10-CM | POA: Diagnosis not present

## 2017-07-18 DIAGNOSIS — L02215 Cutaneous abscess of perineum: Secondary | ICD-10-CM | POA: Diagnosis not present

## 2018-01-14 DIAGNOSIS — Z1211 Encounter for screening for malignant neoplasm of colon: Secondary | ICD-10-CM | POA: Diagnosis not present

## 2018-01-14 DIAGNOSIS — Z6834 Body mass index (BMI) 34.0-34.9, adult: Secondary | ICD-10-CM | POA: Diagnosis not present

## 2018-01-14 DIAGNOSIS — Z1339 Encounter for screening examination for other mental health and behavioral disorders: Secondary | ICD-10-CM | POA: Diagnosis not present

## 2018-01-14 DIAGNOSIS — E785 Hyperlipidemia, unspecified: Secondary | ICD-10-CM | POA: Diagnosis not present

## 2018-01-14 DIAGNOSIS — Z125 Encounter for screening for malignant neoplasm of prostate: Secondary | ICD-10-CM | POA: Diagnosis not present

## 2018-01-14 DIAGNOSIS — Z0000 Encounter for general adult medical examination without abnormal findings: Secondary | ICD-10-CM | POA: Diagnosis not present

## 2018-01-14 DIAGNOSIS — Z1331 Encounter for screening for depression: Secondary | ICD-10-CM | POA: Diagnosis not present

## 2018-01-14 DIAGNOSIS — Z9181 History of falling: Secondary | ICD-10-CM | POA: Diagnosis not present

## 2018-01-14 DIAGNOSIS — Z136 Encounter for screening for cardiovascular disorders: Secondary | ICD-10-CM | POA: Diagnosis not present

## 2018-01-20 DIAGNOSIS — E119 Type 2 diabetes mellitus without complications: Secondary | ICD-10-CM | POA: Diagnosis not present

## 2018-01-20 DIAGNOSIS — E7800 Pure hypercholesterolemia, unspecified: Secondary | ICD-10-CM | POA: Diagnosis not present

## 2018-01-20 DIAGNOSIS — L309 Dermatitis, unspecified: Secondary | ICD-10-CM | POA: Diagnosis not present

## 2018-01-20 DIAGNOSIS — S91301A Unspecified open wound, right foot, initial encounter: Secondary | ICD-10-CM | POA: Diagnosis not present

## 2018-01-20 DIAGNOSIS — Z79899 Other long term (current) drug therapy: Secondary | ICD-10-CM | POA: Diagnosis not present

## 2018-01-20 DIAGNOSIS — Z125 Encounter for screening for malignant neoplasm of prostate: Secondary | ICD-10-CM | POA: Diagnosis not present

## 2018-01-20 DIAGNOSIS — I252 Old myocardial infarction: Secondary | ICD-10-CM | POA: Diagnosis not present

## 2018-01-26 ENCOUNTER — Other Ambulatory Visit: Payer: Self-pay

## 2018-01-26 DIAGNOSIS — L97512 Non-pressure chronic ulcer of other part of right foot with fat layer exposed: Secondary | ICD-10-CM | POA: Diagnosis not present

## 2018-01-26 DIAGNOSIS — I252 Old myocardial infarction: Secondary | ICD-10-CM | POA: Diagnosis not present

## 2018-01-26 DIAGNOSIS — Z8673 Personal history of transient ischemic attack (TIA), and cerebral infarction without residual deficits: Secondary | ICD-10-CM | POA: Diagnosis not present

## 2018-01-26 DIAGNOSIS — M1990 Unspecified osteoarthritis, unspecified site: Secondary | ICD-10-CM | POA: Diagnosis not present

## 2018-01-26 DIAGNOSIS — E119 Type 2 diabetes mellitus without complications: Secondary | ICD-10-CM

## 2018-01-26 DIAGNOSIS — Z7984 Long term (current) use of oral hypoglycemic drugs: Secondary | ICD-10-CM | POA: Diagnosis not present

## 2018-01-26 DIAGNOSIS — M7989 Other specified soft tissue disorders: Secondary | ICD-10-CM

## 2018-01-26 DIAGNOSIS — E1140 Type 2 diabetes mellitus with diabetic neuropathy, unspecified: Secondary | ICD-10-CM | POA: Diagnosis not present

## 2018-01-26 DIAGNOSIS — I5030 Unspecified diastolic (congestive) heart failure: Secondary | ICD-10-CM | POA: Diagnosis not present

## 2018-01-26 DIAGNOSIS — E11621 Type 2 diabetes mellitus with foot ulcer: Secondary | ICD-10-CM | POA: Diagnosis not present

## 2018-01-26 DIAGNOSIS — Z8739 Personal history of other diseases of the musculoskeletal system and connective tissue: Secondary | ICD-10-CM | POA: Diagnosis not present

## 2018-01-26 DIAGNOSIS — L97412 Non-pressure chronic ulcer of right heel and midfoot with fat layer exposed: Secondary | ICD-10-CM | POA: Diagnosis not present

## 2018-01-26 DIAGNOSIS — J449 Chronic obstructive pulmonary disease, unspecified: Secondary | ICD-10-CM | POA: Diagnosis not present

## 2018-01-26 DIAGNOSIS — G4730 Sleep apnea, unspecified: Secondary | ICD-10-CM | POA: Diagnosis not present

## 2018-01-26 DIAGNOSIS — I110 Hypertensive heart disease with heart failure: Secondary | ICD-10-CM | POA: Diagnosis not present

## 2018-01-26 DIAGNOSIS — F17210 Nicotine dependence, cigarettes, uncomplicated: Secondary | ICD-10-CM | POA: Diagnosis not present

## 2018-01-26 HISTORY — DX: Other specified soft tissue disorders: M79.89

## 2018-01-26 HISTORY — DX: Type 2 diabetes mellitus without complications: E11.9

## 2018-01-28 DIAGNOSIS — S91301A Unspecified open wound, right foot, initial encounter: Secondary | ICD-10-CM | POA: Diagnosis not present

## 2018-01-28 DIAGNOSIS — E11621 Type 2 diabetes mellitus with foot ulcer: Secondary | ICD-10-CM | POA: Diagnosis not present

## 2018-01-28 DIAGNOSIS — L97519 Non-pressure chronic ulcer of other part of right foot with unspecified severity: Secondary | ICD-10-CM | POA: Diagnosis not present

## 2018-01-29 DIAGNOSIS — L97519 Non-pressure chronic ulcer of other part of right foot with unspecified severity: Secondary | ICD-10-CM | POA: Diagnosis not present

## 2018-01-29 DIAGNOSIS — E11621 Type 2 diabetes mellitus with foot ulcer: Secondary | ICD-10-CM | POA: Diagnosis not present

## 2018-02-02 DIAGNOSIS — L84 Corns and callosities: Secondary | ICD-10-CM | POA: Diagnosis not present

## 2018-02-02 DIAGNOSIS — E11621 Type 2 diabetes mellitus with foot ulcer: Secondary | ICD-10-CM | POA: Diagnosis not present

## 2018-02-02 DIAGNOSIS — L97412 Non-pressure chronic ulcer of right heel and midfoot with fat layer exposed: Secondary | ICD-10-CM | POA: Diagnosis not present

## 2018-02-02 DIAGNOSIS — L97519 Non-pressure chronic ulcer of other part of right foot with unspecified severity: Secondary | ICD-10-CM | POA: Diagnosis not present

## 2018-02-02 DIAGNOSIS — I5030 Unspecified diastolic (congestive) heart failure: Secondary | ICD-10-CM | POA: Diagnosis not present

## 2018-02-02 DIAGNOSIS — I129 Hypertensive chronic kidney disease with stage 1 through stage 4 chronic kidney disease, or unspecified chronic kidney disease: Secondary | ICD-10-CM | POA: Diagnosis not present

## 2018-02-02 DIAGNOSIS — L97512 Non-pressure chronic ulcer of other part of right foot with fat layer exposed: Secondary | ICD-10-CM | POA: Diagnosis not present

## 2018-02-09 DIAGNOSIS — I5030 Unspecified diastolic (congestive) heart failure: Secondary | ICD-10-CM | POA: Diagnosis not present

## 2018-02-09 DIAGNOSIS — E11621 Type 2 diabetes mellitus with foot ulcer: Secondary | ICD-10-CM | POA: Diagnosis not present

## 2018-02-09 DIAGNOSIS — L97412 Non-pressure chronic ulcer of right heel and midfoot with fat layer exposed: Secondary | ICD-10-CM | POA: Diagnosis not present

## 2018-02-09 DIAGNOSIS — L97512 Non-pressure chronic ulcer of other part of right foot with fat layer exposed: Secondary | ICD-10-CM | POA: Diagnosis not present

## 2018-02-09 DIAGNOSIS — I110 Hypertensive heart disease with heart failure: Secondary | ICD-10-CM | POA: Diagnosis not present

## 2018-02-16 DIAGNOSIS — G8191 Hemiplegia, unspecified affecting right dominant side: Secondary | ICD-10-CM | POA: Diagnosis not present

## 2018-02-16 DIAGNOSIS — Z8673 Personal history of transient ischemic attack (TIA), and cerebral infarction without residual deficits: Secondary | ICD-10-CM | POA: Diagnosis not present

## 2018-02-16 DIAGNOSIS — Z7982 Long term (current) use of aspirin: Secondary | ICD-10-CM | POA: Diagnosis not present

## 2018-02-16 DIAGNOSIS — I5042 Chronic combined systolic (congestive) and diastolic (congestive) heart failure: Secondary | ICD-10-CM | POA: Diagnosis not present

## 2018-02-16 DIAGNOSIS — I639 Cerebral infarction, unspecified: Secondary | ICD-10-CM | POA: Diagnosis not present

## 2018-02-16 DIAGNOSIS — R29818 Other symptoms and signs involving the nervous system: Secondary | ICD-10-CM | POA: Diagnosis not present

## 2018-02-16 DIAGNOSIS — E7800 Pure hypercholesterolemia, unspecified: Secondary | ICD-10-CM | POA: Diagnosis not present

## 2018-02-16 DIAGNOSIS — Z09 Encounter for follow-up examination after completed treatment for conditions other than malignant neoplasm: Secondary | ICD-10-CM | POA: Diagnosis not present

## 2018-02-16 DIAGNOSIS — I252 Old myocardial infarction: Secondary | ICD-10-CM | POA: Diagnosis not present

## 2018-02-16 DIAGNOSIS — Z79899 Other long term (current) drug therapy: Secondary | ICD-10-CM | POA: Diagnosis not present

## 2018-02-16 DIAGNOSIS — Z6834 Body mass index (BMI) 34.0-34.9, adult: Secondary | ICD-10-CM | POA: Diagnosis not present

## 2018-02-16 DIAGNOSIS — E11621 Type 2 diabetes mellitus with foot ulcer: Secondary | ICD-10-CM | POA: Diagnosis not present

## 2018-02-16 DIAGNOSIS — Z872 Personal history of diseases of the skin and subcutaneous tissue: Secondary | ICD-10-CM | POA: Diagnosis not present

## 2018-02-16 DIAGNOSIS — Z955 Presence of coronary angioplasty implant and graft: Secondary | ICD-10-CM | POA: Diagnosis not present

## 2018-02-16 DIAGNOSIS — J449 Chronic obstructive pulmonary disease, unspecified: Secondary | ICD-10-CM | POA: Diagnosis not present

## 2018-02-16 DIAGNOSIS — L97519 Non-pressure chronic ulcer of other part of right foot with unspecified severity: Secondary | ICD-10-CM | POA: Diagnosis not present

## 2018-02-16 DIAGNOSIS — Z880 Allergy status to penicillin: Secondary | ICD-10-CM | POA: Diagnosis not present

## 2018-02-16 DIAGNOSIS — I110 Hypertensive heart disease with heart failure: Secondary | ICD-10-CM | POA: Diagnosis not present

## 2018-02-16 DIAGNOSIS — I2510 Atherosclerotic heart disease of native coronary artery without angina pectoris: Secondary | ICD-10-CM | POA: Diagnosis not present

## 2018-02-16 DIAGNOSIS — R29702 NIHSS score 2: Secondary | ICD-10-CM | POA: Diagnosis not present

## 2018-02-16 DIAGNOSIS — E1165 Type 2 diabetes mellitus with hyperglycemia: Secondary | ICD-10-CM | POA: Diagnosis not present

## 2018-02-16 DIAGNOSIS — M1990 Unspecified osteoarthritis, unspecified site: Secondary | ICD-10-CM | POA: Diagnosis not present

## 2018-02-16 DIAGNOSIS — I671 Cerebral aneurysm, nonruptured: Secondary | ICD-10-CM | POA: Diagnosis not present

## 2018-02-16 DIAGNOSIS — R29701 NIHSS score 1: Secondary | ICD-10-CM | POA: Diagnosis not present

## 2018-02-16 DIAGNOSIS — F17210 Nicotine dependence, cigarettes, uncomplicated: Secondary | ICD-10-CM | POA: Diagnosis not present

## 2018-02-16 DIAGNOSIS — E669 Obesity, unspecified: Secondary | ICD-10-CM | POA: Diagnosis not present

## 2018-02-16 DIAGNOSIS — Z8631 Personal history of diabetic foot ulcer: Secondary | ICD-10-CM | POA: Diagnosis not present

## 2018-02-17 DIAGNOSIS — R200 Anesthesia of skin: Secondary | ICD-10-CM | POA: Diagnosis not present

## 2018-02-17 DIAGNOSIS — R29701 NIHSS score 1: Secondary | ICD-10-CM | POA: Diagnosis present

## 2018-02-17 DIAGNOSIS — I671 Cerebral aneurysm, nonruptured: Secondary | ICD-10-CM | POA: Diagnosis present

## 2018-02-17 DIAGNOSIS — Z6834 Body mass index (BMI) 34.0-34.9, adult: Secondary | ICD-10-CM | POA: Diagnosis not present

## 2018-02-17 DIAGNOSIS — I252 Old myocardial infarction: Secondary | ICD-10-CM | POA: Diagnosis not present

## 2018-02-17 DIAGNOSIS — F17210 Nicotine dependence, cigarettes, uncomplicated: Secondary | ICD-10-CM | POA: Diagnosis present

## 2018-02-17 DIAGNOSIS — I639 Cerebral infarction, unspecified: Secondary | ICD-10-CM | POA: Diagnosis present

## 2018-02-17 DIAGNOSIS — G8191 Hemiplegia, unspecified affecting right dominant side: Secondary | ICD-10-CM | POA: Diagnosis present

## 2018-02-17 DIAGNOSIS — E119 Type 2 diabetes mellitus without complications: Secondary | ICD-10-CM

## 2018-02-17 DIAGNOSIS — Z880 Allergy status to penicillin: Secondary | ICD-10-CM | POA: Diagnosis not present

## 2018-02-17 DIAGNOSIS — Z79899 Other long term (current) drug therapy: Secondary | ICD-10-CM | POA: Diagnosis not present

## 2018-02-17 DIAGNOSIS — I110 Hypertensive heart disease with heart failure: Secondary | ICD-10-CM | POA: Diagnosis present

## 2018-02-17 DIAGNOSIS — Z7982 Long term (current) use of aspirin: Secondary | ICD-10-CM | POA: Diagnosis not present

## 2018-02-17 DIAGNOSIS — R29818 Other symptoms and signs involving the nervous system: Secondary | ICD-10-CM | POA: Diagnosis not present

## 2018-02-17 DIAGNOSIS — M1990 Unspecified osteoarthritis, unspecified site: Secondary | ICD-10-CM | POA: Diagnosis present

## 2018-02-17 DIAGNOSIS — E1165 Type 2 diabetes mellitus with hyperglycemia: Secondary | ICD-10-CM | POA: Diagnosis not present

## 2018-02-17 DIAGNOSIS — I6523 Occlusion and stenosis of bilateral carotid arteries: Secondary | ICD-10-CM | POA: Diagnosis not present

## 2018-02-17 DIAGNOSIS — E7800 Pure hypercholesterolemia, unspecified: Secondary | ICD-10-CM | POA: Diagnosis present

## 2018-02-17 DIAGNOSIS — Z8673 Personal history of transient ischemic attack (TIA), and cerebral infarction without residual deficits: Secondary | ICD-10-CM | POA: Diagnosis not present

## 2018-02-17 DIAGNOSIS — I2510 Atherosclerotic heart disease of native coronary artery without angina pectoris: Secondary | ICD-10-CM | POA: Diagnosis present

## 2018-02-17 DIAGNOSIS — E785 Hyperlipidemia, unspecified: Secondary | ICD-10-CM

## 2018-02-17 DIAGNOSIS — R29702 NIHSS score 2: Secondary | ICD-10-CM | POA: Diagnosis present

## 2018-02-17 DIAGNOSIS — I5042 Chronic combined systolic (congestive) and diastolic (congestive) heart failure: Secondary | ICD-10-CM | POA: Diagnosis not present

## 2018-02-17 DIAGNOSIS — E669 Obesity, unspecified: Secondary | ICD-10-CM | POA: Diagnosis present

## 2018-02-17 DIAGNOSIS — Z955 Presence of coronary angioplasty implant and graft: Secondary | ICD-10-CM | POA: Diagnosis not present

## 2018-02-17 DIAGNOSIS — J449 Chronic obstructive pulmonary disease, unspecified: Secondary | ICD-10-CM | POA: Diagnosis present

## 2018-02-17 NOTE — Progress Notes (Signed)
Cardiology Office Note:    Date:  02/18/2018   ID:  Craig Rollins, DOB [birth year], MRN [id number]  PCP:  [name], NP  Cardiologist:  Shirlee More, MD   Referring MD: [name], NP  ASSESSMENT:    1. CAD in native artery   2. Nonrheumatic aortic valve stenosis   3. Familial hyperlipidemia   4. Essential hypertension   5. Cerebrovascular accident (CVA), unspecified mechanism ([place])    PLAN:    In order of problems listed above:  1. Stable continue current treatment including dual antiplatelet therapy and intensify lipid-lowering therapy at this time is having no angina in his current treatment.  I do not think he needs an ischemia evaluation 2. New finding on echocardiogram he had a minimal gradient at cath I suspect its overestimated likely bicuspid and will plan repeat echocardiogram in 1 year.  Check LP(a) as often associated with rapidly progressive atherosclerosis in a young age and more rapid progression of aortic stenosis 3. Clinically he is familiar hyperlipidemia with an LDL cholesterol greater than 130 and high intensity statin.  I added Zetia 10 mg/day 1 month check a lipid profile and LP(a) and if not at an LDL of 55 or less will require PCSK9 therapy. 4. Stable blood pressure target home blood pressure runs in the range of 1 15-1 20 and continue his lisinopril 5. Recent stroke managed by his PCP and in hospital physicians including neurologist at [facility]  Next appointment 6 weeks   Medication Adjustments/Labs and Tests Ordered: Current medicines are reviewed at length with the patient today.  Concerns regarding medicines are outlined above.  Orders Placed This Encounter  Procedures  . Lipid Profile  . Lipoprotein A (LPA)   Meds ordered this encounter  Medications  . ezetimibe (ZETIA) 10 MG tablet    Sig: Take 1 tablet (10 mg total) by mouth daily.    Dispense:  30 tablet    Refill:  1     Chief Complaint  Patient presents with  .  Coronary Artery Disease    I had seen him in 2018 as inpatient at [facility]    History of Present Illness:    Craig Rollins is a 62 y.o. male who is being seen today for the evaluation of CAD, PCI of LAD and RCA, cardiomyopathy EF 20-25 %, mild to moderate AR,  hypertension, T2 DM and hyperliipidemia at the request of [name], NP.  Left heart cath 09/26/16: Cardiac Arteries and Lesion Findings  LMCA: Mild luminal irregularities < 10%  LAD: 95% Mid LAD  70-80- sequential Mid LAD lesion  Lesion on Prox LAD: Proximal subsection.95% stenosis 18 mm length reduced  to 0%. Pre procedure TIMI III flow was noted. Post Procedure TIMI III flow  was present. Good run off was present. The lesion was diagnosed as Moderate  Risk (B).  Devices used  - Abbott 0.014" x 190cm Balance Heavyweight J-Tip PTCI Guidewire with  HYDROCOAT. Number of passes: 1.  - 2.5x15 Euphora. 2 inflation(s) to a max pressure of: 12 atm.  - 3.0X22 Resolute Onyx. 1 inflation(s) to a max pressure of: 12 atm.  LCx: Mild luminal irregularities  RCA: Mid RCA 80 %  Proximal RCA 70&  Distal RCA 40-50%  Lesion on Mid RCA: Mid subsection.85% stenosis 11 mm length reduced to 0%.  Pre procedure TIMI III flow was noted. Post Procedure TIMI III flow was  present. Good run off was present. The lesion was diagnosed as Low  Risk  Interventional Summary  Successful PCI to the LAD and RCA  He arrives to my office today and informs [place] that he was just discharged from [facility].  He sustained a stroke.  Echocardiogram performed 02/17/2018 shows EF of 50 to 55% with anterolateral hypokinesia normal left atrial pressure moderate aortic stenosis peak and mean 34 and 20 mmHg valve area 1.2 cm.  Testing performed at the hospital showed a normal CBC normal renal function potassium 4.3 troponin was checked and found to be normal he did not have a BNP level performed his EKG showed sinus rhythm  septal infarction age-indeterminate in 1 of his discharge diagnoses was congestive heart failure but he had no signs or symptoms in the hospital there is no discussion at discharge he is not on a diuretic.  Cardiac perspective he is done well he has had no angina edema shortness of breath chest pain palpitation or syncope.  He was at [facility] with a recent stroke.  He is having some difficulty with fine motor function with the right hand and otherwise not having severe symptoms.  He continues to smoke he is waiting a prescription from his PCP for smoking cessation and is committed.  I strongly encouraged him to stop smoking.  He is compliant with his medications  Past Medical History:  Diagnosis Date  . Brain aneurysm   . COPD (chronic obstructive pulmonary disease) ([place])   . Diabetes ([place])   . High cholesterol   . Myocardial infarction ([place])   . Stroke ([place])   . TIA (transient ischemic attack)     Past Surgical History:  Procedure Laterality Date  . CORONARY ANGIOPLASTY WITH STENT PLACEMENT    . KNEE SURGERY      Current Medications: Current Meds  Medication Sig  . aspirin EC 81 MG tablet Take 81 mg by mouth daily.  Marland Kitchen atorvastatin (LIPITOR) 80 MG tablet TAKE 1 TABLET BY MOUTH EVERY EVENING.  Marland Kitchen clopidogrel (PLAVIX) 75 MG tablet TAKE 1 TABLET BY MOUTH DAILY.  Marland Kitchen lisinopril (PRINIVIL,ZESTRIL) 5 MG tablet Take 5 mg by mouth daily.  Marland Kitchen loratadine-pseudoephedrine (CLARITIN-D 12-HOUR) 5-120 MG tablet Take 1 tablet by mouth 2 (two) times daily as needed for allergies.  . metFORMIN (GLUCOPHAGE) 1000 MG tablet TAKE ONE TABLET BY MOUTH TWICE DAILY  . nitroGLYCERIN (NITROSTAT) 0.4 MG SL tablet PLACE 1 TABLET (0.4 MG TOTAL) UNDER THE TONGUE EVERY FIVE (5) MINUTES AS NEEDED FOR CHEST PAIN.  Marland Kitchen triamcinolone cream (KENALOG) 0.1 % Apply 1 application topically 2 (two) times daily.     Allergies:   Penicillins   Social History   Socioeconomic History  . Marital status: Single    Spouse  name: Not on file  . Number of children: Not on file  . Years of education: Not on file  . Highest education level: Not on file  Occupational History  . Not on file  Social Needs  . Financial resource strain: Not on file  . Food insecurity:    Worry: Not on file    Inability: Not on file  . Transportation needs:    Medical: Not on file    Non-medical: Not on file  Tobacco Use  . Smoking status: Current Every Day Smoker    Packs/day: 1.00  . Smokeless tobacco: Never Used  Substance and Sexual Activity  . Alcohol use: Not Currently  . Drug use: Never  . Sexual activity: Not on file  Lifestyle  . Physical activity:    Days  per week: Not on file    Minutes per session: Not on file  . Stress: Not on file  Relationships  . Social connections:    Talks on phone: Not on file    Gets together: Not on file    Attends religious service: Not on file    Active member of club or organization: Not on file    Attends meetings of clubs or organizations: Not on file    Relationship status: Not on file  Other Topics Concern  . Not on file  Social History Narrative  . Not on file     Family History: The patient's family history includes Breast cancer in his paternal grandmother; Diabetes in his father; Heart attack in his father and paternal grandfather; Heart disease in his father; Hyperlipidemia in his father; Hypertension in his father; Liver cancer in his maternal grandfather.  ROS:   ROS Please see the history of present illness.     All other systems reviewed and are negative.  EKGs/Labs/Other Studies Reviewed:    The following studies were reviewed today:   EKG:  EKG 02/17/18 [place] old [facility]  Recent Labs: No results found for requested labs within last 8760 hours.  Recent Lipid Panel No results found for: CHOL, TRIG, HDL, CHOLHDL, VLDL, LDLCALC, LDLDIRECT  Physical Exam:    VS:  BP (!) 96/56 (BP Location: Right Arm, Patient Position: Sitting, Cuff Size: Large)   Pulse  (!) 104   Ht '6\' 4"'$  (1.93 m)   Wt 274 lb 9.6 oz (124.6 kg)   SpO2 96%   BMI 33.43 kg/m     Wt Readings from Last 3 Encounters:  02/18/18 274 lb 9.6 oz (124.6 kg)     GEN:  Well nourished, well developed in no acute distress HEENT: Normal NECK: No JVD; No carotid bruits LYMPHATICS: No lymphadenopathy CARDIAC: 2/6 harsh Sem aortic area to right clavicleRRR, no murmurs, rubs, gallops RESPIRATORY:  Clear to auscultation without rales, wheezing or rhonchi  ABDOMEN: Soft, non-tender, non-distended MUSCULOSKELETAL:  No edema; No deformity  SKIN: Warm and dry NEUROLOGIC:  Alert and oriented x 3 PSYCHIATRIC:  Normal affect     Signed, Shirlee More, MD  02/18/2018 4:51 PM    [facility]

## 2018-02-18 ENCOUNTER — Encounter: Payer: Self-pay | Admitting: Cardiology

## 2018-02-18 ENCOUNTER — Ambulatory Visit (INDEPENDENT_AMBULATORY_CARE_PROVIDER_SITE_OTHER): Payer: Medicare Other | Admitting: Cardiology

## 2018-02-18 VITALS — BP 96/56 | HR 104 | Ht 76.0 in | Wt 274.6 lb

## 2018-02-18 DIAGNOSIS — I639 Cerebral infarction, unspecified: Secondary | ICD-10-CM

## 2018-02-18 DIAGNOSIS — E7849 Other hyperlipidemia: Secondary | ICD-10-CM | POA: Diagnosis not present

## 2018-02-18 DIAGNOSIS — I350 Nonrheumatic aortic (valve) stenosis: Secondary | ICD-10-CM

## 2018-02-18 DIAGNOSIS — I2510 Atherosclerotic heart disease of native coronary artery without angina pectoris: Secondary | ICD-10-CM

## 2018-02-18 DIAGNOSIS — I10 Essential (primary) hypertension: Secondary | ICD-10-CM

## 2018-02-18 HISTORY — DX: Nonrheumatic aortic (valve) stenosis: I35.0

## 2018-02-18 HISTORY — DX: Atherosclerotic heart disease of native coronary artery without angina pectoris: I25.10

## 2018-02-18 HISTORY — DX: Essential (primary) hypertension: I10

## 2018-02-18 MED ORDER — EZETIMIBE 10 MG PO TABS: 10.0000 mg | ORAL_TABLET | Freq: Every day | ORAL | 1 refills | Status: DC

## 2018-02-18 NOTE — Patient Instructions (Addendum)
Medication Instructions:  Your physician has recommended you make the following change in your medication:   START: Zetia 10mg  one tablet daily        Labwork: You will return to office in 1 month for lab work:  Lipid  Testing/Procedures: NONE  Follow-Up: Your physician recommends that you schedule a follow-up appointment in: 6 weeks  You will receive a call from [facility] to set up consult for Cardiac Rehab   Any Other Special Instructions Will Be Listed Below (If Applicable).     If you need a refill on your cardiac medications before your next appointment, please call your pharmacy.       Coping with Quitting Smoking Quitting smoking is a physical and mental challenge. You will face cravings, withdrawal symptoms, and temptation. Before quitting, work with your health care provider to make a plan that can help you cope. Preparation can help you quit and keep you from giving in. How can I cope with cravings? Cravings usually last for 5-10 minutes. If you get through it, the craving will pass. Consider taking the following actions to help you cope with cravings:  Keep your mouth busy: ? Chew sugar-free gum. ? Suck on hard candies or a straw. ? Brush your teeth.  Keep your hands and body busy: ? Immediately change to a different activity when you feel a craving. ? Squeeze or play with a ball. ? Do an activity or a hobby, like making bead jewelry, practicing needlepoint, or working with wood. ? Mix up your normal routine. ? Take a short exercise break. Go for a quick walk or run up and down stairs. ? Spend time in public places where smoking is not allowed.  Focus on doing something kind or helpful for someone else.  Call a friend or family member to talk during a craving.  Join a support group.  Call a quit line, such as 1-800-QUIT-NOW.  Talk with your health care provider about medicines that might help you cope with cravings and make quitting easier for  you.  How can I deal with withdrawal symptoms? Your body may experience negative effects as it tries to get used to not having nicotine in the system. These effects are called withdrawal symptoms. They may include:  Feeling hungrier than normal.  Trouble concentrating.  Irritability.  Trouble sleeping.  Feeling depressed.  Restlessness and agitation.  Craving a cigarette.  To manage withdrawal symptoms:  Avoid places, people, and activities that trigger your cravings.  Remember why you want to quit.  Get plenty of sleep.  Avoid coffee and other caffeinated drinks. These may worsen some of your symptoms.  How can I handle social situations? Social situations can be difficult when you are quitting smoking, especially in the first few weeks. To manage this, you can:  Avoid parties, bars, and other social situations where people might be smoking.  Avoid alcohol.  Leave right away if you have the urge to smoke.  Explain to your family and friends that you are quitting smoking. Ask for understanding and support.  Plan activities with friends or family where smoking is not an option.  What are some ways I can cope with stress? Wanting to smoke may cause stress, and stress can make you want to smoke. Find ways to manage your stress. Relaxation techniques can help. For example:  Breathe slowly and deeply, in through your nose and out through your mouth.  Listen to soothing, relaxing music.  Talk with a family  member or friend about your stress.  Light a candle.  Soak in a bath or take a shower.  Think about a peaceful place.  What are some ways I can prevent weight gain? Be aware that many people gain weight after they quit smoking. However, not everyone does. To keep from gaining weight, have a plan in place before you quit and stick to the plan after you quit. Your plan should include:  Having healthy snacks. When you have a craving, it may help to: ? Eat plain  popcorn, crunchy carrots, celery, or other cut vegetables. ? Chew sugar-free gum.  Changing how you eat: ? Eat small portion sizes at meals. ? Eat 4-6 small meals throughout the day instead of 1-2 large meals a day. ? Be mindful when you eat. Do not watch television or do other things that might distract you as you eat.  Exercising regularly: ? Make time to exercise each day. If you do not have time for a long workout, do short bouts of exercise for 5-10 minutes several times a day. ? Do some form of strengthening exercise, like weight lifting, and some form of aerobic exercise, like running or swimming.  Drinking plenty of water or other low-calorie or no-calorie drinks. Drink 6-8 glasses of water daily, or as much as instructed by your health care provider.  Summary  Quitting smoking is a physical and mental challenge. You will face cravings, withdrawal symptoms, and temptation to smoke again. Preparation can help you as you go through these challenges.  You can cope with cravings by keeping your mouth busy (such as by chewing gum), keeping your body and hands busy, and making calls to family, friends, or a helpline for people who want to quit smoking.  You can cope with withdrawal symptoms by avoiding places where people smoke, avoiding drinks with caffeine, and getting plenty of rest.  Ask your health care provider about the different ways to prevent weight gain, avoid stress, and handle social situations. This information is not intended to replace advice given to you by your health care provider. Make sure you discuss any questions you have with your health care provider. Document Released: 05/03/2016 Document Revised: 05/03/2016 Document Reviewed: 05/03/2016 Elsevier Interactive Patient Education  [employer].

## 2018-02-25 DIAGNOSIS — I2510 Atherosclerotic heart disease of native coronary artery without angina pectoris: Secondary | ICD-10-CM | POA: Diagnosis not present

## 2018-02-25 DIAGNOSIS — I639 Cerebral infarction, unspecified: Secondary | ICD-10-CM | POA: Diagnosis not present

## 2018-02-25 DIAGNOSIS — L02211 Cutaneous abscess of abdominal wall: Secondary | ICD-10-CM | POA: Diagnosis not present

## 2018-02-25 DIAGNOSIS — Z8673 Personal history of transient ischemic attack (TIA), and cerebral infarction without residual deficits: Secondary | ICD-10-CM | POA: Diagnosis not present

## 2018-02-25 DIAGNOSIS — Z09 Encounter for follow-up examination after completed treatment for conditions other than malignant neoplasm: Secondary | ICD-10-CM | POA: Diagnosis not present

## 2018-02-25 DIAGNOSIS — I10 Essential (primary) hypertension: Secondary | ICD-10-CM | POA: Diagnosis not present

## 2018-02-25 DIAGNOSIS — F17200 Nicotine dependence, unspecified, uncomplicated: Secondary | ICD-10-CM | POA: Diagnosis not present

## 2018-02-25 DIAGNOSIS — Z1339 Encounter for screening examination for other mental health and behavioral disorders: Secondary | ICD-10-CM | POA: Diagnosis not present

## 2018-03-04 DIAGNOSIS — Z79899 Other long term (current) drug therapy: Secondary | ICD-10-CM | POA: Diagnosis not present

## 2018-03-04 DIAGNOSIS — Z1211 Encounter for screening for malignant neoplasm of colon: Secondary | ICD-10-CM | POA: Diagnosis not present

## 2018-03-04 DIAGNOSIS — Z23 Encounter for immunization: Secondary | ICD-10-CM | POA: Diagnosis not present

## 2018-03-04 DIAGNOSIS — I639 Cerebral infarction, unspecified: Secondary | ICD-10-CM | POA: Diagnosis not present

## 2018-03-06 DIAGNOSIS — Z91011 Allergy to milk products: Secondary | ICD-10-CM | POA: Diagnosis not present

## 2018-03-06 DIAGNOSIS — T781XXA Other adverse food reactions, not elsewhere classified, initial encounter: Secondary | ICD-10-CM | POA: Diagnosis not present

## 2018-03-24 ENCOUNTER — Ambulatory Visit (INDEPENDENT_AMBULATORY_CARE_PROVIDER_SITE_OTHER): Payer: Medicare Other | Admitting: Neurology

## 2018-03-24 ENCOUNTER — Encounter: Payer: Self-pay | Admitting: Neurology

## 2018-03-24 VITALS — BP 123/76 | HR 82 | Ht 76.0 in | Wt 275.2 lb

## 2018-03-24 DIAGNOSIS — E0842 Diabetes mellitus due to underlying condition with diabetic polyneuropathy: Secondary | ICD-10-CM | POA: Diagnosis not present

## 2018-03-24 DIAGNOSIS — I6381 Other cerebral infarction due to occlusion or stenosis of small artery: Secondary | ICD-10-CM

## 2018-03-24 MED ORDER — TOPIRAMATE 50 MG PO TABS: 50.0000 mg | ORAL_TABLET | Freq: Every day | ORAL | 3 refills | Status: AC

## 2018-03-24 NOTE — Patient Instructions (Signed)
I had a long d/w patient about his recent lacunar stroke, diabetic neuropathtrisk for recurrent stroke/TIAs, personally independently reviewed imaging studies and stroke evaluation results and answered questions.Continue aspirin 81 mg daily and clopidogrel 75 mg daily  for secondary stroke prevention given h/o drug coated cardiac stents and maintain strict control of hypertension with blood pressure goal below 130/90, diabetes with hemoglobin A1c goal below 6.5% and lipids with LDL cholesterol goal below 70 mg/dL. I also advised the patient to eat a healthy diet with plenty of whole grains, cereals, fruits and vegetables, exercise regularly and maintain ideal body weight. Start Topamax 50 mg at night to help with post stroke paresthesias and diabetic neuropathy . Recommend outpatient physical and occupational therapy.Followup in the future with my nurse practitioner Janett Billow  In 3 months or call earlier if needed   Stroke Prevention Some medical conditions and behaviors are associated with a higher chance of having a stroke. You can help prevent a stroke by making nutrition, lifestyle, and other changes, including managing any medical conditions you may have. What nutrition changes can be made?  Eat healthy foods. You can do this by: ? Choosing foods high in fiber, such as fresh fruits and vegetables and whole grains. ? Eating at least 5 or more servings of fruits and vegetables a day. Try to fill half of your plate at each meal with fruits and vegetables. ? Choosing lean protein foods, such as lean cuts of meat, poultry without skin, fish, tofu, beans, and nuts. ? Eating low-fat dairy products. ? Avoiding foods that are high in salt (sodium). This can help lower blood pressure. ? Avoiding foods that have saturated fat, trans fat, and cholesterol. This can help prevent high cholesterol. ? Avoiding processed and premade foods.  Follow your health care provider's specific guidelines for losing weight,  controlling high blood pressure (hypertension), lowering high cholesterol, and managing diabetes. These may include: ? Reducing your daily calorie intake. ? Limiting your daily sodium intake to 1,500 milligrams (mg). ? Using only healthy fats for cooking, such as olive oil, canola oil, or sunflower oil. ? Counting your daily carbohydrate intake. What lifestyle changes can be made?  Maintain a healthy weight. Talk to your health care provider about your ideal weight.  Get at least 30 minutes of moderate physical activity at least 5 days a week. Moderate activity includes brisk walking, biking, and swimming.  Do not use any products that contain nicotine or tobacco, such as cigarettes and e-cigarettes. If you need help quitting, ask your health care provider. It may also be helpful to avoid exposure to secondhand smoke.  Limit alcohol intake to no more than 1 drink a day for nonpregnant women and 2 drinks a day for men. One drink equals 12 oz of beer, 5 oz of wine, or 1 oz of hard liquor.  Stop any illegal drug use.  Avoid taking birth control pills. Talk to your health care provider about the risks of taking birth control pills if: ? You are over 8 years old. ? You smoke. ? You get migraines. ? You have ever had a blood clot. What other changes can be made?  Manage your cholesterol levels. ? Eating a healthy diet is important for preventing high cholesterol. If cholesterol cannot be managed through diet alone, you may also need to take medicines. ? Take any prescribed medicines to control your cholesterol as told by your health care provider.  Manage your diabetes. ? Eating a healthy diet and exercising regularly  are important parts of managing your blood sugar. If your blood sugar cannot be managed through diet and exercise, you may need to take medicines. ? Take any prescribed medicines to control your diabetes as told by your health care provider.  Control your hypertension. ? To  reduce your risk of stroke, try to keep your blood pressure below 130/80. ? Eating a healthy diet and exercising regularly are an important part of controlling your blood pressure. If your blood pressure cannot be managed through diet and exercise, you may need to take medicines. ? Take any prescribed medicines to control hypertension as told by your health care provider. ? Ask your health care provider if you should monitor your blood pressure at home. ? Have your blood pressure checked every year, even if your blood pressure is normal. Blood pressure increases with age and some medical conditions.  Get evaluated for sleep disorders (sleep apnea). Talk to your health care provider about getting a sleep evaluation if you snore a lot or have excessive sleepiness.  Take over-the-counter and prescription medicines only as told by your health care provider. Aspirin or blood thinners (antiplatelets or anticoagulants) may be recommended to reduce your risk of forming blood clots that can lead to stroke.  Make sure that any other medical conditions you have, such as atrial fibrillation or atherosclerosis, are managed. What are the warning signs of a stroke? The warning signs of a stroke can be easily remembered as BEFAST.  B is for balance. Signs include: ? Dizziness. ? Loss of balance or coordination. ? Sudden trouble walking.  E is for eyes. Signs include: ? A sudden change in vision. ? Trouble seeing.  F is for face. Signs include: ? Sudden weakness or numbness of the face. ? The face or eyelid drooping to one side.  A is for arms. Signs include: ? Sudden weakness or numbness of the arm, usually on one side of the body.  S is for speech. Signs include: ? Trouble speaking (aphasia). ? Trouble understanding.  T is for time. ? These symptoms may represent a serious problem that is an emergency. Do not wait to see if the symptoms will go away. Get medical help right away. Call your local  emergency services (911 in the [place] Do not drive yourself to the hospital.  Other signs of stroke may include: ? A sudden, severe headache with no known cause. ? Nausea or vomiting. ? Seizure.  Where to find more information: For more information, visit:  American Stroke Association: www.strokeassociation.org  National Stroke Association: www.stroke.org  Summary  You can prevent a stroke by eating healthy, exercising, not smoking, limiting alcohol intake, and managing any medical conditions you may have.  Do not use any products that contain nicotine or tobacco, such as cigarettes and e-cigarettes. If you need help quitting, ask your health care provider. It may also be helpful to avoid exposure to secondhand smoke.  Remember BEFAST for warning signs of stroke. Get help right away if you or a loved one has any of these signs. This information is not intended to replace advice given to you by your health care provider. Make sure you discuss any questions you have with your health care provider. Document Released: 06/13/2004 Document Revised: 06/11/2016 Document Reviewed: 06/11/2016 Elsevier Interactive Patient Education  [employer].

## 2018-03-24 NOTE — Progress Notes (Signed)
[facility] [address] [place]. [place] [address] [phone]       OFFICE CONSULT NOTE  Mr. Craig Rollins Date of Birth:  May 23, 1955 Medical Record Number:  [id number]   Referring MD: [name], NP  Reason for Referral:   stroke  HPI: Mr. Craig Rollins is a 62 year old Caucasian male seen today for initial office consultation visit.  History is obtained from the patient and review of referral notes.  I personally reviewed imaging films in PACS.  Patient was admitted to [facility] on 02/17/2018 when he woke up with sudden onset of right-sided face and body weakness and numbness.  He had some dysarthria and mild right-sided weakness.  He was admitted to the hospital a few days.  I do not have the actual discharge summary and hospital records reviewed but review of imaging studies in PACS shows that he had a small left lateral thalamic lacunar infarct.  CT angiogram of brain and neck both did not reveal any significant large vessel intracranial or extracranial stenosis.  Transthoracic echocardiogram apparently was unremarkable.  Patient was placed on aspirin and Plavix for stroke prevention as he had a history of drug-coated cardiac stents as well.  He was also advised to continue his blood pressure and diabetes medications.  He states that he was discharged home and has not received any outpatient or home therapies.  He still has some numbness in his right lateral 2 fingers no facial numbness has improved.  He is tolerating aspirin Plavix well with only minor bruising and no bleeding.  His fasting sugars have ranged in the 1 30-1 40 range.  His blood pressure is well controlled and today it is 123/76.  He does have a chronic diabetic peripheral neuropathy and residual paresthesias in his feet particularly at night is quite restless.  He has not tried any specific medications for neuropathic pain.  He denies any prior history of strokes TIAs seizures or significant  neurological problems.  ROS:   14 system review of systems is positive for leg swelling, shortness of breath, numbness, slurred speech, joint pain and swelling, aching muscles, allergies, runny nose, decreased energy and all other systems negative PMH:  Past Medical History:  Diagnosis Date  . Brain aneurysm   . COPD (chronic obstructive pulmonary disease) ([place])   . Diabetes ([place])   . High cholesterol   . Myocardial infarction ([place])   . Stroke ([place])   . TIA (transient ischemic attack)     Social History:  Social History   Socioeconomic History  . Marital status: Single    Spouse name: Not on file  . Number of children: Not on file  . Years of education: Not on file  . Highest education level: Not on file  Occupational History  . Not on file  Social Needs  . Financial resource strain: Not on file  . Food insecurity:    Worry: Not on file    Inability: Not on file  . Transportation needs:    Medical: Not on file    Non-medical: Not on file  Tobacco Use  . Smoking status: Current Every Day Smoker    Packs/day: 1.00  . Smokeless tobacco: Never Used  Substance and Sexual Activity  . Alcohol use: Not Currently  . Drug use: Never  . Sexual activity: Not on file  Lifestyle  . Physical activity:    Days per week: Not on file    Minutes per session: Not on file  . Stress:  Not on file  Relationships  . Social connections:    Talks on phone: Not on file    Gets together: Not on file    Attends religious service: Not on file    Active member of club or organization: Not on file    Attends meetings of clubs or organizations: Not on file    Relationship status: Not on file  . Intimate partner violence:    Fear of current or ex partner: Not on file    Emotionally abused: Not on file    Physically abused: Not on file    Forced sexual activity: Not on file  Other Topics Concern  . Not on file  Social History Narrative  . Not on file    Medications:   Current  Outpatient Medications on File Prior to Visit  Medication Sig Dispense Refill  . aspirin EC 81 MG tablet Take 81 mg by mouth daily.    Marland Kitchen atorvastatin (LIPITOR) 80 MG tablet TAKE 1 TABLET BY MOUTH EVERY EVENING.    Marland Kitchen clopidogrel (PLAVIX) 75 MG tablet TAKE 1 TABLET BY MOUTH DAILY.    Marland Kitchen ezetimibe (ZETIA) 10 MG tablet Take 1 tablet (10 mg total) by mouth daily. 30 tablet 1  . lisinopril (PRINIVIL,ZESTRIL) 5 MG tablet Take 5 mg by mouth daily.    Marland Kitchen loratadine-pseudoephedrine (CLARITIN-D 12-HOUR) 5-120 MG tablet Take 1 tablet by mouth 2 (two) times daily as needed for allergies.    . metFORMIN (GLUCOPHAGE) 1000 MG tablet TAKE ONE TABLET BY MOUTH TWICE DAILY    . nitroGLYCERIN (NITROSTAT) 0.4 MG SL tablet PLACE 1 TABLET (0.4 MG TOTAL) UNDER THE TONGUE EVERY FIVE (5) MINUTES AS NEEDED FOR CHEST PAIN.    Marland Kitchen triamcinolone cream (KENALOG) 0.1 % Apply 1 application topically 2 (two) times daily.     No current facility-administered medications on file prior to visit.     Allergies:   Allergies  Allergen Reactions  . Penicillins Nausea And Vomiting    Physical Exam General:obese middle aged Caucasian amle, seated, in no evident distress Head: head normocephalic and atraumatic.   Neck: supple with no carotid or supraclavicular bruits Cardiovascular: regular rate and rhythm, no murmurs Musculoskeletal: no deformity Skin:  no rash/petichiae. Right foot diabetic ulcer Vascular:  Normal pulses all extremities  Neurologic Exam Mental Status: Awake and fully alert. Oriented to place and time. Recent and remote memory intact. Attention span, concentration and fund of knowledge appropriate. Mood and affect appropriate.  Cranial Nerves: Fundoscopic exam reveals sharp disc margins. Pupils equal, briskly reactive to light. Extraocular movements full without nystagmus. Visual fields full to confrontation. Hearing intact. Facial sensation intact. Face, tongue, palate moves normally and symmetrically.  Motor:  Normal bulk and tone. Normal strength in all tested extremity muscles.mild ankle dorsiflexor weakness right more than left. Sensory.: diminished touch , pinprick , position and vibratory sensation from ankle down.  Coordination: Rapid alternating movements normal in all extremities. Finger-to-nose and heel-to-shin performed accurately bilaterally. Gait and Station: Arises from chair with mild difficulty. Stance is wide basedl. Gait demonstrates broad base and favors right foot due to pain. .Unable to heel, toe and tandem walk without difficulty.  Reflexes: 1+ depressed and symmetric. Toes downgoing.   NIHSS  1 Modified Rankin 2  ASSESSMENT: 62 year old male with recent right subcortical lacunar infarct secondary to small vessel disease as well as chronic extremity paresthesias from diabetic peripheral neuropathy.  Vascular risk factors of diabetes, hypertension, hyperlipidemia and cerebrovascular disease.     PLAN:  I had a long d/w patient about his recent lacunar stroke, diabetic neuropathy,trisk for recurrent stroke/TIAs, personally independently reviewed imaging studies and stroke evaluation results and answered questions.Continue aspirin 81 mg daily and clopidogrel 75 mg daily  for secondary stroke prevention given h/o drug coated cardiac stents and maintain strict control of hypertension with blood pressure goal below 130/90, diabetes with hemoglobin A1c goal below 6.5% and lipids with LDL cholesterol goal below 70 mg/dL. I also advised the patient to eat a healthy diet with plenty of whole grains, cereals, fruits and vegetables, exercise regularly and maintain ideal body weight. Start Topamax 50 mg at night to help with post stroke paresthesias and diabetic neuropathy . Recommend outpatient physical and occupational therapy.. Greater than 50% time during this 45-minute consultation visit was spent in counseling and coordination of care about his lacunar stroke and diabetic neuropathy and  answering questions followup in the future with my nurse practitioner Janett Billow  In 3 months or call earlier if needed Antony Contras, MD  [facility] [address] [place], [place] [address]  Phone [phone] Fax [phone] Note: This document was prepared with digital dictation and possible smart phrase technology. Any transcriptional errors that result from this process are unintentional.

## 2018-03-31 NOTE — Progress Notes (Signed)
Cardiology Office Note:    Date:  04/01/2018   ID:  [name], DOB 1955/06/30, MRN [id number]  PCP:  [name], NP  Cardiologist:  Shirlee More, MD    Referring MD: [name], NP    ASSESSMENT:    1. Nonrheumatic aortic valve stenosis   2. CAD in native artery   3. Essential hypertension    PLAN:    In order of problems listed above:  1. He has a bicuspid aortic valve congenital moderate stenosis with his ongoing cigarette smoking and worried about the rate of progression we will recheck his echocardiogram in April to 50-month interval and if he becomes severe and symptomatic he benefit from intervention in the field is rapidly progressing and bicuspid valves are potentially able to be done with TAVR.  For now is asymptomatic no restrictions continue endocarditis prophylaxis and check his lipids including LP(a) which is a risk factor for rapid progression could bicuspid aortic valves. 2. Stable CAD New York Heart Association class I having no anginal discomfort continue current medical treatment including dual antiplatelet and lipid-lowering therapy with high intensity statin and Zetia. 3. Stable continue his ACE inhibitor 4. Continues to smoke strongly encouraged him and counseled regarding smoking cessation 5.    Next appointment: 6 months after his echocardiogram   Medication Adjustments/Labs and Tests Ordered: Current medicines are reviewed at length with the patient today.  Concerns regarding medicines are outlined above.  No orders of the defined types were placed in this encounter.  No orders of the defined types were placed in this encounter.   Chief Complaint  Patient presents with  . Follow-up  . Coronary Artery Disease  . Aortic Stenosis    History of Present Illness:    Craig Rollins is a 62 y.o. male with a hx of CAD, PCI of LAD and RCA, 09/26/16  cardiomyopathy EF 20-25 %, mild to moderate AR,  hypertension, T2 DM and hyperliipidemia   last seen 02/18/18. He recently sustained a stroke.  Echocardiogram performed 02/17/2018 shows EF of 50 to 55% with anterolateral hypokinesia normal left atrial pressure moderate aortic stenosis peak and mean 34 and 20 mmHg valve area 1.2 cm.  Testing performed at the hospital showed a normal CBC normal renal function potassium 4.3 troponin was checked and found to be normal he did not have a BNP level performed his EKG showed sinus rhythm septal infarction age-indeterminate in 1 of his discharge diagnoses was congestive heart failure but he had no signs or symptoms in the hospital there is no discussion at discharge he is not on a diuretic.   Compliance with diet, lifestyle and medications: yes  He continues to slowly and steadily improve engaged in physical therapy no complaints of chest pain dyspnea palpitation or syncope but still having trouble with ambulation and transfers.  He does not follow and will initiate endocarditis prophylaxis.  Follow-up lipids will be performed to follow-up LP(a) and a CMP as he has not had labs since hospital discharge.  He has had no chest pain shortness of breath or syncope cardinal symptoms of aortic stenosis Past Medical History:  Diagnosis Date  . Brain aneurysm   . COPD (chronic obstructive pulmonary disease) ([place])   . Diabetes ([place])   . High cholesterol   . Myocardial infarction ([place])   . Stroke ([place])   . TIA (transient ischemic attack)     Past Surgical History:  Procedure Laterality Date  . CORONARY ANGIOPLASTY WITH STENT PLACEMENT    .  KNEE SURGERY      Current Medications: Current Meds  Medication Sig  . aspirin EC 81 MG tablet Take 81 mg by mouth daily.  Marland Kitchen atorvastatin (LIPITOR) 80 MG tablet TAKE 1 TABLET BY MOUTH EVERY EVENING.  Marland Kitchen clopidogrel (PLAVIX) 75 MG tablet TAKE 1 TABLET BY MOUTH DAILY.  Marland Kitchen ezetimibe (ZETIA) 10 MG tablet Take 1 tablet (10 mg total) by mouth daily.  Marland Kitchen lisinopril (PRINIVIL,ZESTRIL) 5 MG tablet Take 5 mg by mouth daily.    . metFORMIN (GLUCOPHAGE) 1000 MG tablet TAKE ONE TABLET BY MOUTH TWICE DAILY  . nitroGLYCERIN (NITROSTAT) 0.4 MG SL tablet PLACE 1 TABLET (0.4 MG TOTAL) UNDER THE TONGUE EVERY FIVE (5) MINUTES AS NEEDED FOR CHEST PAIN.  Marland Kitchen topiramate (TOPAMAX) 50 MG tablet Take 1 tablet (50 mg total) by mouth daily.  Marland Kitchen triamcinolone cream (KENALOG) 0.1 % Apply 1 application topically 2 (two) times daily.     Allergies:   Penicillins and Milk-related compounds   Social History   Socioeconomic History  . Marital status: Single    Spouse name: Not on file  . Number of children: Not on file  . Years of education: Not on file  . Highest education level: Not on file  Occupational History  . Not on file  Social Needs  . Financial resource strain: Not on file  . Food insecurity:    Worry: Not on file    Inability: Not on file  . Transportation needs:    Medical: Not on file    Non-medical: Not on file  Tobacco Use  . Smoking status: Current Every Day Smoker    Packs/day: 1.00  . Smokeless tobacco: Never Used  Substance and Sexual Activity  . Alcohol use: Not Currently  . Drug use: Never  . Sexual activity: Not on file  Lifestyle  . Physical activity:    Days per week: Not on file    Minutes per session: Not on file  . Stress: Not on file  Relationships  . Social connections:    Talks on phone: Not on file    Gets together: Not on file    Attends religious service: Not on file    Active member of club or organization: Not on file    Attends meetings of clubs or organizations: Not on file    Relationship status: Not on file  Other Topics Concern  . Not on file  Social History Narrative  . Not on file     Family History: The patient's family history includes Breast cancer in his paternal grandmother; Diabetes in his father; Heart attack in his father and paternal grandfather; Heart disease in his father; Hyperlipidemia in his father; Hypertension in his father; Liver cancer in his maternal  grandfather. ROS:   Please see the history of present illness.    All other systems reviewed and are negative.  EKGs/Labs/Other Studies Reviewed:    The following studies were reviewed today:   Recent Labs: No results found for requested labs within last 8760 hours.  Recent Lipid Panelhol 187 HDL 31 LDL 132 01/20/18 No results found for: CHOL, TRIG, HDL, CHOLHDL, VLDL, LDLCALC, LDLDIRECT  Physical Exam:    VS:  BP 120/74 (BP Location: Left Arm, Patient Position: Sitting, Cuff Size: Large)   Pulse 92   Ht 6\' 4"  (1.93 m)   Wt 273 lb 9.6 oz (124.1 kg)   SpO2 97%   BMI 33.30 kg/m     Wt Readings from Last 3 Encounters:  04/01/18 273 lb 9.6 oz (124.1 kg)  03/24/18 275 lb 3.2 oz (124.8 kg)  02/18/18 274 lb 9.6 oz (124.6 kg)     GEN:  Well nourished, well developed in no acute distress HEENT: Normal NECK: No JVD; No carotid bruits LYMPHATICS: No lymphadenopathy CARDIAC: 3/6 harsh late peaking SEM aortic area to carotids single s2 1/6 AR RRR, no rubs, gallops RESPIRATORY:  Clear to auscultation without rales, wheezing or rhonchi  ABDOMEN: Soft, non-tender, non-distended MUSCULOSKELETAL:  No edema; No deformity  SKIN: Warm and dry NEUROLOGIC:  Alert and oriented x 3 PSYCHIATRIC:  Normal affect    Signed, Shirlee More, MD  04/01/2018 3:52 PM    [facility]

## 2018-04-01 ENCOUNTER — Encounter: Payer: Self-pay | Admitting: Cardiology

## 2018-04-01 ENCOUNTER — Ambulatory Visit (INDEPENDENT_AMBULATORY_CARE_PROVIDER_SITE_OTHER): Payer: Medicare Other | Admitting: Cardiology

## 2018-04-01 VITALS — BP 120/74 | HR 92 | Ht 76.0 in | Wt 273.6 lb

## 2018-04-01 DIAGNOSIS — I639 Cerebral infarction, unspecified: Secondary | ICD-10-CM

## 2018-04-01 DIAGNOSIS — I2510 Atherosclerotic heart disease of native coronary artery without angina pectoris: Secondary | ICD-10-CM

## 2018-04-01 DIAGNOSIS — I10 Essential (primary) hypertension: Secondary | ICD-10-CM | POA: Diagnosis not present

## 2018-04-01 DIAGNOSIS — I350 Nonrheumatic aortic (valve) stenosis: Secondary | ICD-10-CM | POA: Diagnosis not present

## 2018-04-01 MED ORDER — CLINDAMYCIN HCL 300 MG PO CAPS: ORAL_CAPSULE | ORAL | 1 refills | Status: DC

## 2018-04-01 NOTE — Patient Instructions (Addendum)
Medication Instructions:  Your physician has recommended you make the following change in your medication:   START: Clindamycin 300mg  capsules:  Take 2 capsules (600mg ) 45 minutes prior to any dental procedure.  If you need a refill on your cardiac medications before your next appointment, please call your pharmacy.   Lab work: You will have lab work today:  CMP, Lipids, Lipoprotein A If you have labs (blood work) drawn today and your tests are completely normal, you will receive your results only by: Marland Kitchen MyChart Message (if you have MyChart) OR . A paper copy in the mail If you have any lab test that is abnormal or we need to change your treatment, we will call you to review the results.  Testing/Procedures: Your physician has requested that you have an echocardiogram. Echocardiography is a painless test that uses sound waves to create images of your heart. It provides your doctor with information about the size and shape of your heart and how well your heart's chambers and valves are working. This procedure takes approximately one hour. There are no restrictions for this procedure.    Follow-Up: At [facility], you and your health needs are our priority.  As part of our continuing mission to provide you with exceptional heart care, we have created designated Provider Care Teams.  These Care Teams include your primary Cardiologist (physician) and Advanced Practice Providers (APPs -  Physician Assistants and Nurse Practitioners) who all work together to provide you with the care you need, when you need it. You will need a follow up appointment in 5 months.  Please call our office 2 months in advance to schedule this appointment.      Aortic Valve Stenosis Aortic valve stenosis is a narrowing of the aortic valve. The aortic valve opens and closes to regulate blood flow between the lower left chamber of the heart (left ventricle) and the blood vessel that leads away from the heart (aorta).  When the aortic valve becomes narrow, it makes it difficult for the heart to pump blood into the aorta, which causes the heart to work harder. The extra work can weaken the heart over time. Aortic valve stenosis can range from mild to severe. If untreated, it can become more severe over time and can lead to heart failure. What are the causes? This condition may be caused by:  Buildup of calcium around and on the valve. This can occur with aging. This is the most common cause of aortic valve stenosis.  Birth defect.  Rheumatic fever.  Radiation to the chest.  What increases the risk? You may be more likely to develop this condition if:  You are over the age of 6.  You were born with an abnormal bicuspid valve.  What are the signs or symptoms? You may have no symptoms until your condition becomes severe. It may take 10-20 years for mild or moderate aortic valve stenosis to become severe. Symptoms may include:  Shortness of breath. This may get worse during physical activity.  Feeling unusually weak and tired (fatigue).  Extreme discomfort in the chest, neck, or arm (angina).  A heartbeat that is irregular or faster than normal (palpitations).  Dizziness or fainting. This may happen when you get physically tired or after you take certain heart medicines, such as nitroglycerin.  How is this diagnosed? This condition may be diagnosed with:  A physical exam.  Echocardiogram. This is a type of imaging test that uses sound waves (ultrasound) to make an image  of your heart. There are two types that may be used: ? Transthoracic echocardiogram (TTE). This type of echocardiogram is noninvasive, and it is usually done first. ? Transesophageal echocardiogram (TEE). This type of echocardiogram is done by passing a flexible tube down your esophagus. The heart and the esophagus are close to each other, so your health care provider can take very clear, detailed pictures of the heart using this  type of test.  Cardiac catheterization. In this procedure, a thin, flexible tube (catheter) is passed through a large vein in your neck, groin, or arm. This procedure provides information about arteries, structures, blood pressure, and oxygen levels in your heart.  Electrocardiogram (ECG). This records the electrical impulses of your heart and assesses heart function.  Stress tests. These are tests that evaluate the blood supply to your heart and your heart's response to exercise.  Blood tests.  You may work with a health care provider who specializes in the heart (cardiologist). How is this treated? Treatment depends on how severe your condition is and what your symptoms are. You will need to have your heart checked regularly to make sure that your condition is not getting worse or causing serious problems. If your condition is mild, no treatment may be needed. Treatment may include:  Medicines that help keep your heart rate regular.  Medicines that thin your blood (anticoagulants) to prevent the formation of blood clots.  Antibiotic medicines to help prevent infection.  Surgery to replace your aortic valve. This is the most common treatment for aortic valve stenosis. Several types of surgeries are available. The surgery may be done through a large incision over your heart (open heart surgery), or it may be done using a minimally invasive technique (transcatheter aortic valve replacement, or TAVR).  Follow these instructions at home: Lifestyle   Limit alcohol intake to no more than 1 drink per day for nonpregnant women and 2 drinks per day for men. One drink equals 12 oz of beer, 5 oz of wine, or 1 oz of hard liquor.  Do not use any tobacco products, such as cigarettes, chewing tobacco, or e-cigarettes. If you need help quitting, ask your health care provider.  Work with your health care provider to manage your blood pressure and cholesterol.  Maintain a healthy weight. Eating and  drinking  Follow instructions from your health care provider about eating or drinking restrictions. ? Limit how much caffeine you drink. Caffeine can affect your heart's rate and rhythm.  Drink enough fluid to keep your urine clear or pale yellow.  Eat a heart-healthy diet. This should include plenty of fresh fruits and vegetables. If you eat meat, it should be lean cuts. Avoid foods that are: ? High in salt, saturated fat, or sugar. ? Canned or highly processed. ? Fried. Activity  Return to your normal activities as told by your health care provider. Ask your health care provider what activities are safe for you.  Exercise regularly, as told by your health care provider. Ask your health care provider what types of exercise are safe for you.  If your aortic valve stenosis is mild, you may need to avoid only very intense physical activity. The more severe your aortic valve stenosis is, the more activities you may need to avoid. General instructions  Take over-the-counter and prescription medicines only as told by your health care provider.  If you are a woman and you plan to become pregnant, talk with your health care provider before you become pregnant.  Tell all health care providers who care for you that you have aortic valve stenosis.  Keep all follow-up visits as told by your health care provider. This is important. Contact a health care provider if:  You have a fever. Get help right away if:  You develop chest pain or tightness.  You develop shortness of breath or difficulty breathing.  You feel light-headed.  You feel like you might faint.  Your heartbeat is irregular or faster than normal. These symptoms may represent a serious problem that is an emergency. Do not wait to see if the symptoms will go away. Get medical help right away. Call your local emergency services (911 in the [place] Do not drive yourself to the hospital. This information is not intended to replace  advice given to you by your health care provider. Make sure you discuss any questions you have with your health care provider. Document Released: 02/02/2003 Document Revised: 10/12/2015 Document Reviewed: 04/09/2015 Elsevier Interactive Patient Education  2017 [employer].

## 2018-04-01 NOTE — Addendum Note (Signed)
Addended by: Stevan Born on: 04/01/2018 05:06 PM   Modules accepted: Orders

## 2018-04-03 LAB — LIPOPROTEIN A (LPA): Lipoprotein (a): <8.4 nmol/L (ref <75.0)

## 2018-04-03 LAB — COMPREHENSIVE METABOLIC PANEL
ALT: 16 IU/L (ref 0–44)
AST: 15 IU/L (ref 0–40)
Albumin/Globulin Ratio: 1.4 (ref 1.2–2.2)
Albumin: 3.9 g/dL (ref 3.6–4.8)
Alkaline Phosphatase: 81 IU/L (ref 39–117)
BUN/Creatinine Ratio: 11 (ref 10–24)
BUN: 16 mg/dL (ref 8–27)
Bilirubin Total: 0.3 mg/dL (ref 0.0–1.2)
CO2: 25 mmol/L (ref 20–29)
Calcium: 10.3 mg/dL — ABNORMAL HIGH (ref 8.6–10.2)
Chloride: 105 mmol/L (ref 96–106)
Creatinine, Ser: 1.49 mg/dL — ABNORMAL HIGH (ref 0.76–1.27)
GFR calc Af Amer: 58 mL/min/{1.73_m2} — ABNORMAL LOW (ref >59)
GFR calc non Af Amer: 50 mL/min/{1.73_m2} — ABNORMAL LOW (ref >59)
Globulin, Total: 2.8 g/dL (ref 1.5–4.5)
Glucose: 139 mg/dL — ABNORMAL HIGH (ref 65–99)
Potassium: 4.9 mmol/L (ref 3.5–5.2)
Sodium: 142 mmol/L (ref 134–144)
Total Protein: 6.7 g/dL (ref 6.0–8.5)

## 2018-04-03 LAB — LIPID PANEL
Chol/HDL Ratio: 3.3 ratio (ref 0.0–5.0)
Cholesterol, Total: 107 mg/dL (ref 100–199)
HDL: 32 mg/dL — ABNORMAL LOW (ref >39)
LDL Calculated: 52 mg/dL (ref 0–99)
Triglycerides: 116 mg/dL (ref 0–149)
VLDL Cholesterol Cal: 23 mg/dL (ref 5–40)

## 2018-04-23 ENCOUNTER — Other Ambulatory Visit: Payer: Self-pay | Admitting: *Deleted

## 2018-04-23 MED ORDER — EZETIMIBE 10 MG PO TABS: 10.0000 mg | ORAL_TABLET | Freq: Every day | ORAL | 4 refills | Status: DC

## 2018-04-23 NOTE — Telephone Encounter (Signed)
Refill for zetia 10 mg sent to [facility] ([place]

## 2018-04-27 ENCOUNTER — Telehealth: Payer: Self-pay

## 2018-04-27 MED ORDER — EZETIMIBE 10 MG PO TABS: 10.0000 mg | ORAL_TABLET | Freq: Every day | ORAL | 5 refills | Status: DC

## 2018-04-27 NOTE — Telephone Encounter (Signed)
Rx sent to pharmacy as requested.

## 2018-05-06 DIAGNOSIS — E119 Type 2 diabetes mellitus without complications: Secondary | ICD-10-CM | POA: Diagnosis not present

## 2018-05-06 DIAGNOSIS — E785 Hyperlipidemia, unspecified: Secondary | ICD-10-CM | POA: Diagnosis not present

## 2018-05-06 DIAGNOSIS — R21 Rash and other nonspecific skin eruption: Secondary | ICD-10-CM | POA: Diagnosis not present

## 2018-05-06 DIAGNOSIS — R972 Elevated prostate specific antigen [PSA]: Secondary | ICD-10-CM | POA: Diagnosis not present

## 2018-05-25 DIAGNOSIS — N401 Enlarged prostate with lower urinary tract symptoms: Secondary | ICD-10-CM | POA: Diagnosis not present

## 2018-05-25 DIAGNOSIS — R972 Elevated prostate specific antigen [PSA]: Secondary | ICD-10-CM | POA: Diagnosis not present

## 2018-05-26 DIAGNOSIS — R21 Rash and other nonspecific skin eruption: Secondary | ICD-10-CM | POA: Diagnosis not present

## 2018-05-27 DIAGNOSIS — L299 Pruritus, unspecified: Secondary | ICD-10-CM | POA: Diagnosis not present

## 2018-05-27 DIAGNOSIS — L209 Atopic dermatitis, unspecified: Secondary | ICD-10-CM | POA: Diagnosis not present

## 2018-06-15 DIAGNOSIS — L209 Atopic dermatitis, unspecified: Secondary | ICD-10-CM | POA: Diagnosis not present

## 2018-06-24 DIAGNOSIS — R972 Elevated prostate specific antigen [PSA]: Secondary | ICD-10-CM | POA: Diagnosis not present

## 2018-06-24 DIAGNOSIS — R81 Glycosuria: Secondary | ICD-10-CM | POA: Diagnosis not present

## 2018-06-24 DIAGNOSIS — N401 Enlarged prostate with lower urinary tract symptoms: Secondary | ICD-10-CM | POA: Diagnosis not present

## 2018-06-25 ENCOUNTER — Ambulatory Visit: Payer: Medicare Other | Admitting: Adult Health

## 2018-06-29 DIAGNOSIS — M1990 Unspecified osteoarthritis, unspecified site: Secondary | ICD-10-CM | POA: Diagnosis not present

## 2018-06-29 DIAGNOSIS — E11621 Type 2 diabetes mellitus with foot ulcer: Secondary | ICD-10-CM | POA: Diagnosis not present

## 2018-06-29 DIAGNOSIS — I252 Old myocardial infarction: Secondary | ICD-10-CM | POA: Diagnosis not present

## 2018-06-29 DIAGNOSIS — F17200 Nicotine dependence, unspecified, uncomplicated: Secondary | ICD-10-CM | POA: Diagnosis not present

## 2018-06-29 DIAGNOSIS — I5030 Unspecified diastolic (congestive) heart failure: Secondary | ICD-10-CM | POA: Diagnosis not present

## 2018-06-29 DIAGNOSIS — L97512 Non-pressure chronic ulcer of other part of right foot with fat layer exposed: Secondary | ICD-10-CM | POA: Diagnosis not present

## 2018-06-29 DIAGNOSIS — L84 Corns and callosities: Secondary | ICD-10-CM | POA: Diagnosis not present

## 2018-06-29 DIAGNOSIS — E1140 Type 2 diabetes mellitus with diabetic neuropathy, unspecified: Secondary | ICD-10-CM | POA: Diagnosis not present

## 2018-06-29 DIAGNOSIS — I96 Gangrene, not elsewhere classified: Secondary | ICD-10-CM | POA: Diagnosis not present

## 2018-06-29 DIAGNOSIS — J449 Chronic obstructive pulmonary disease, unspecified: Secondary | ICD-10-CM | POA: Diagnosis not present

## 2018-06-29 DIAGNOSIS — G4730 Sleep apnea, unspecified: Secondary | ICD-10-CM | POA: Diagnosis not present

## 2018-06-29 DIAGNOSIS — I110 Hypertensive heart disease with heart failure: Secondary | ICD-10-CM | POA: Diagnosis not present

## 2018-06-29 DIAGNOSIS — E1152 Type 2 diabetes mellitus with diabetic peripheral angiopathy with gangrene: Secondary | ICD-10-CM | POA: Diagnosis not present

## 2018-06-29 DIAGNOSIS — L97412 Non-pressure chronic ulcer of right heel and midfoot with fat layer exposed: Secondary | ICD-10-CM | POA: Diagnosis not present

## 2018-06-29 DIAGNOSIS — Z8673 Personal history of transient ischemic attack (TIA), and cerebral infarction without residual deficits: Secondary | ICD-10-CM | POA: Diagnosis not present

## 2018-06-29 DIAGNOSIS — Z7984 Long term (current) use of oral hypoglycemic drugs: Secondary | ICD-10-CM | POA: Diagnosis not present

## 2018-06-29 DIAGNOSIS — F17210 Nicotine dependence, cigarettes, uncomplicated: Secondary | ICD-10-CM | POA: Diagnosis not present

## 2018-06-29 DIAGNOSIS — Z8739 Personal history of other diseases of the musculoskeletal system and connective tissue: Secondary | ICD-10-CM | POA: Diagnosis not present

## 2018-07-06 DIAGNOSIS — E11621 Type 2 diabetes mellitus with foot ulcer: Secondary | ICD-10-CM | POA: Diagnosis not present

## 2018-07-06 DIAGNOSIS — L97512 Non-pressure chronic ulcer of other part of right foot with fat layer exposed: Secondary | ICD-10-CM | POA: Diagnosis not present

## 2018-07-06 DIAGNOSIS — L97412 Non-pressure chronic ulcer of right heel and midfoot with fat layer exposed: Secondary | ICD-10-CM | POA: Diagnosis not present

## 2018-07-06 DIAGNOSIS — I5030 Unspecified diastolic (congestive) heart failure: Secondary | ICD-10-CM | POA: Diagnosis not present

## 2018-07-06 DIAGNOSIS — E1152 Type 2 diabetes mellitus with diabetic peripheral angiopathy with gangrene: Secondary | ICD-10-CM | POA: Diagnosis not present

## 2018-07-06 DIAGNOSIS — I96 Gangrene, not elsewhere classified: Secondary | ICD-10-CM | POA: Diagnosis not present

## 2018-07-07 DIAGNOSIS — I252 Old myocardial infarction: Secondary | ICD-10-CM | POA: Diagnosis not present

## 2018-07-07 DIAGNOSIS — J019 Acute sinusitis, unspecified: Secondary | ICD-10-CM | POA: Diagnosis not present

## 2018-07-09 DIAGNOSIS — E119 Type 2 diabetes mellitus without complications: Secondary | ICD-10-CM | POA: Diagnosis not present

## 2018-07-09 DIAGNOSIS — I252 Old myocardial infarction: Secondary | ICD-10-CM | POA: Diagnosis not present

## 2018-07-09 DIAGNOSIS — Z7984 Long term (current) use of oral hypoglycemic drugs: Secondary | ICD-10-CM | POA: Diagnosis not present

## 2018-07-09 DIAGNOSIS — R0602 Shortness of breath: Secondary | ICD-10-CM | POA: Diagnosis not present

## 2018-07-09 DIAGNOSIS — F17210 Nicotine dependence, cigarettes, uncomplicated: Secondary | ICD-10-CM | POA: Diagnosis not present

## 2018-07-09 DIAGNOSIS — I10 Essential (primary) hypertension: Secondary | ICD-10-CM | POA: Diagnosis not present

## 2018-07-09 DIAGNOSIS — J189 Pneumonia, unspecified organism: Secondary | ICD-10-CM | POA: Diagnosis not present

## 2018-07-09 DIAGNOSIS — Z79899 Other long term (current) drug therapy: Secondary | ICD-10-CM | POA: Diagnosis not present

## 2018-07-09 DIAGNOSIS — Z7982 Long term (current) use of aspirin: Secondary | ICD-10-CM | POA: Diagnosis not present

## 2018-07-09 DIAGNOSIS — J449 Chronic obstructive pulmonary disease, unspecified: Secondary | ICD-10-CM | POA: Diagnosis not present

## 2018-07-09 DIAGNOSIS — J440 Chronic obstructive pulmonary disease with acute lower respiratory infection: Secondary | ICD-10-CM | POA: Diagnosis not present

## 2018-07-09 DIAGNOSIS — Z8673 Personal history of transient ischemic attack (TIA), and cerebral infarction without residual deficits: Secondary | ICD-10-CM | POA: Diagnosis not present

## 2018-07-13 DIAGNOSIS — L97512 Non-pressure chronic ulcer of other part of right foot with fat layer exposed: Secondary | ICD-10-CM | POA: Diagnosis not present

## 2018-07-13 DIAGNOSIS — E11621 Type 2 diabetes mellitus with foot ulcer: Secondary | ICD-10-CM | POA: Diagnosis not present

## 2018-07-13 DIAGNOSIS — E1152 Type 2 diabetes mellitus with diabetic peripheral angiopathy with gangrene: Secondary | ICD-10-CM | POA: Diagnosis not present

## 2018-07-13 DIAGNOSIS — L97412 Non-pressure chronic ulcer of right heel and midfoot with fat layer exposed: Secondary | ICD-10-CM | POA: Diagnosis not present

## 2018-07-13 DIAGNOSIS — I96 Gangrene, not elsewhere classified: Secondary | ICD-10-CM | POA: Diagnosis not present

## 2018-07-13 DIAGNOSIS — I5030 Unspecified diastolic (congestive) heart failure: Secondary | ICD-10-CM | POA: Diagnosis not present

## 2018-07-14 DIAGNOSIS — J189 Pneumonia, unspecified organism: Secondary | ICD-10-CM | POA: Diagnosis not present

## 2018-07-15 ENCOUNTER — Other Ambulatory Visit: Payer: Self-pay | Admitting: Neurology

## 2018-07-20 DIAGNOSIS — E1152 Type 2 diabetes mellitus with diabetic peripheral angiopathy with gangrene: Secondary | ICD-10-CM | POA: Diagnosis not present

## 2018-07-20 DIAGNOSIS — E11621 Type 2 diabetes mellitus with foot ulcer: Secondary | ICD-10-CM | POA: Diagnosis not present

## 2018-07-20 DIAGNOSIS — L97512 Non-pressure chronic ulcer of other part of right foot with fat layer exposed: Secondary | ICD-10-CM | POA: Diagnosis not present

## 2018-07-20 DIAGNOSIS — L97412 Non-pressure chronic ulcer of right heel and midfoot with fat layer exposed: Secondary | ICD-10-CM | POA: Diagnosis not present

## 2018-07-20 DIAGNOSIS — I96 Gangrene, not elsewhere classified: Secondary | ICD-10-CM | POA: Diagnosis not present

## 2018-07-20 DIAGNOSIS — I5030 Unspecified diastolic (congestive) heart failure: Secondary | ICD-10-CM | POA: Diagnosis not present

## 2018-07-22 NOTE — Telephone Encounter (Signed)
Refill denied- pt needs appt

## 2018-07-30 DIAGNOSIS — E1152 Type 2 diabetes mellitus with diabetic peripheral angiopathy with gangrene: Secondary | ICD-10-CM | POA: Diagnosis not present

## 2018-07-30 DIAGNOSIS — L84 Corns and callosities: Secondary | ICD-10-CM | POA: Diagnosis not present

## 2018-07-30 DIAGNOSIS — I96 Gangrene, not elsewhere classified: Secondary | ICD-10-CM | POA: Diagnosis not present

## 2018-07-30 DIAGNOSIS — E11621 Type 2 diabetes mellitus with foot ulcer: Secondary | ICD-10-CM | POA: Diagnosis not present

## 2018-07-30 DIAGNOSIS — I5030 Unspecified diastolic (congestive) heart failure: Secondary | ICD-10-CM | POA: Diagnosis not present

## 2018-07-30 DIAGNOSIS — L97512 Non-pressure chronic ulcer of other part of right foot with fat layer exposed: Secondary | ICD-10-CM | POA: Diagnosis not present

## 2018-07-30 DIAGNOSIS — L97412 Non-pressure chronic ulcer of right heel and midfoot with fat layer exposed: Secondary | ICD-10-CM | POA: Diagnosis not present

## 2018-08-04 DIAGNOSIS — A419 Sepsis, unspecified organism: Secondary | ICD-10-CM | POA: Diagnosis not present

## 2018-08-04 DIAGNOSIS — F17210 Nicotine dependence, cigarettes, uncomplicated: Secondary | ICD-10-CM | POA: Diagnosis present

## 2018-08-04 DIAGNOSIS — M86071 Acute hematogenous osteomyelitis, right ankle and foot: Secondary | ICD-10-CM | POA: Diagnosis not present

## 2018-08-04 DIAGNOSIS — L97512 Non-pressure chronic ulcer of other part of right foot with fat layer exposed: Secondary | ICD-10-CM | POA: Diagnosis not present

## 2018-08-04 DIAGNOSIS — I509 Heart failure, unspecified: Secondary | ICD-10-CM | POA: Diagnosis present

## 2018-08-04 DIAGNOSIS — E11628 Type 2 diabetes mellitus with other skin complications: Secondary | ICD-10-CM | POA: Diagnosis not present

## 2018-08-04 DIAGNOSIS — L089 Local infection of the skin and subcutaneous tissue, unspecified: Secondary | ICD-10-CM | POA: Diagnosis not present

## 2018-08-04 DIAGNOSIS — M86171 Other acute osteomyelitis, right ankle and foot: Secondary | ICD-10-CM | POA: Diagnosis not present

## 2018-08-04 DIAGNOSIS — I69931 Monoplegia of upper limb following unspecified cerebrovascular disease affecting right dominant side: Secondary | ICD-10-CM | POA: Diagnosis not present

## 2018-08-04 DIAGNOSIS — M1990 Unspecified osteoarthritis, unspecified site: Secondary | ICD-10-CM | POA: Diagnosis present

## 2018-08-04 DIAGNOSIS — Z79899 Other long term (current) drug therapy: Secondary | ICD-10-CM | POA: Diagnosis not present

## 2018-08-04 DIAGNOSIS — R220 Localized swelling, mass and lump, head: Secondary | ICD-10-CM | POA: Diagnosis not present

## 2018-08-04 DIAGNOSIS — I252 Old myocardial infarction: Secondary | ICD-10-CM | POA: Diagnosis not present

## 2018-08-04 DIAGNOSIS — I110 Hypertensive heart disease with heart failure: Secondary | ICD-10-CM | POA: Diagnosis present

## 2018-08-04 DIAGNOSIS — M86671 Other chronic osteomyelitis, right ankle and foot: Secondary | ICD-10-CM | POA: Diagnosis not present

## 2018-08-04 DIAGNOSIS — E1169 Type 2 diabetes mellitus with other specified complication: Secondary | ICD-10-CM | POA: Diagnosis not present

## 2018-08-04 DIAGNOSIS — Z7982 Long term (current) use of aspirin: Secondary | ICD-10-CM | POA: Diagnosis not present

## 2018-08-04 DIAGNOSIS — L02611 Cutaneous abscess of right foot: Secondary | ICD-10-CM | POA: Diagnosis not present

## 2018-08-04 DIAGNOSIS — L03115 Cellulitis of right lower limb: Secondary | ICD-10-CM | POA: Diagnosis not present

## 2018-08-04 DIAGNOSIS — M869 Osteomyelitis, unspecified: Secondary | ICD-10-CM | POA: Diagnosis not present

## 2018-08-04 DIAGNOSIS — Z955 Presence of coronary angioplasty implant and graft: Secondary | ICD-10-CM | POA: Diagnosis not present

## 2018-08-04 DIAGNOSIS — R000 Tachycardia, unspecified: Secondary | ICD-10-CM | POA: Diagnosis not present

## 2018-08-04 DIAGNOSIS — Z880 Allergy status to penicillin: Secondary | ICD-10-CM | POA: Diagnosis not present

## 2018-08-04 DIAGNOSIS — J449 Chronic obstructive pulmonary disease, unspecified: Secondary | ICD-10-CM | POA: Diagnosis not present

## 2018-08-04 DIAGNOSIS — L97519 Non-pressure chronic ulcer of other part of right foot with unspecified severity: Secondary | ICD-10-CM | POA: Diagnosis not present

## 2018-08-04 DIAGNOSIS — E785 Hyperlipidemia, unspecified: Secondary | ICD-10-CM | POA: Diagnosis not present

## 2018-08-04 DIAGNOSIS — I2510 Atherosclerotic heart disease of native coronary artery without angina pectoris: Secondary | ICD-10-CM | POA: Diagnosis not present

## 2018-08-04 DIAGNOSIS — E1165 Type 2 diabetes mellitus with hyperglycemia: Secondary | ICD-10-CM | POA: Diagnosis not present

## 2018-08-04 DIAGNOSIS — E11621 Type 2 diabetes mellitus with foot ulcer: Secondary | ICD-10-CM | POA: Diagnosis not present

## 2018-08-05 DIAGNOSIS — E11621 Type 2 diabetes mellitus with foot ulcer: Secondary | ICD-10-CM

## 2018-08-05 DIAGNOSIS — M869 Osteomyelitis, unspecified: Secondary | ICD-10-CM

## 2018-08-06 DIAGNOSIS — M869 Osteomyelitis, unspecified: Secondary | ICD-10-CM

## 2018-08-06 DIAGNOSIS — E11621 Type 2 diabetes mellitus with foot ulcer: Secondary | ICD-10-CM

## 2018-08-07 ENCOUNTER — Encounter: Payer: Self-pay | Admitting: Sports Medicine

## 2018-08-10 DIAGNOSIS — Z7902 Long term (current) use of antithrombotics/antiplatelets: Secondary | ICD-10-CM | POA: Diagnosis not present

## 2018-08-10 DIAGNOSIS — I110 Hypertensive heart disease with heart failure: Secondary | ICD-10-CM | POA: Diagnosis not present

## 2018-08-10 DIAGNOSIS — I69331 Monoplegia of upper limb following cerebral infarction affecting right dominant side: Secondary | ICD-10-CM | POA: Diagnosis not present

## 2018-08-10 DIAGNOSIS — Z7984 Long term (current) use of oral hypoglycemic drugs: Secondary | ICD-10-CM | POA: Diagnosis not present

## 2018-08-10 DIAGNOSIS — I509 Heart failure, unspecified: Secondary | ICD-10-CM | POA: Diagnosis not present

## 2018-08-10 DIAGNOSIS — J449 Chronic obstructive pulmonary disease, unspecified: Secondary | ICD-10-CM | POA: Diagnosis not present

## 2018-08-10 DIAGNOSIS — E1165 Type 2 diabetes mellitus with hyperglycemia: Secondary | ICD-10-CM | POA: Diagnosis not present

## 2018-08-10 DIAGNOSIS — F17210 Nicotine dependence, cigarettes, uncomplicated: Secondary | ICD-10-CM | POA: Diagnosis not present

## 2018-08-10 DIAGNOSIS — I2510 Atherosclerotic heart disease of native coronary artery without angina pectoris: Secondary | ICD-10-CM | POA: Diagnosis not present

## 2018-08-10 DIAGNOSIS — I252 Old myocardial infarction: Secondary | ICD-10-CM | POA: Diagnosis not present

## 2018-08-10 DIAGNOSIS — Z89421 Acquired absence of other right toe(s): Secondary | ICD-10-CM | POA: Diagnosis not present

## 2018-08-10 DIAGNOSIS — Z4781 Encounter for orthopedic aftercare following surgical amputation: Secondary | ICD-10-CM | POA: Diagnosis not present

## 2018-08-10 DIAGNOSIS — E785 Hyperlipidemia, unspecified: Secondary | ICD-10-CM | POA: Diagnosis not present

## 2018-08-10 DIAGNOSIS — M1991 Primary osteoarthritis, unspecified site: Secondary | ICD-10-CM | POA: Diagnosis not present

## 2018-08-12 DIAGNOSIS — E1165 Type 2 diabetes mellitus with hyperglycemia: Secondary | ICD-10-CM | POA: Diagnosis not present

## 2018-08-12 DIAGNOSIS — I2510 Atherosclerotic heart disease of native coronary artery without angina pectoris: Secondary | ICD-10-CM | POA: Diagnosis not present

## 2018-08-12 DIAGNOSIS — Z4781 Encounter for orthopedic aftercare following surgical amputation: Secondary | ICD-10-CM | POA: Diagnosis not present

## 2018-08-12 DIAGNOSIS — I509 Heart failure, unspecified: Secondary | ICD-10-CM | POA: Diagnosis not present

## 2018-08-12 DIAGNOSIS — I110 Hypertensive heart disease with heart failure: Secondary | ICD-10-CM | POA: Diagnosis not present

## 2018-08-12 DIAGNOSIS — I69331 Monoplegia of upper limb following cerebral infarction affecting right dominant side: Secondary | ICD-10-CM | POA: Diagnosis not present

## 2018-08-13 ENCOUNTER — Telehealth: Payer: Self-pay | Admitting: *Deleted

## 2018-08-13 ENCOUNTER — Encounter: Payer: Self-pay | Admitting: Sports Medicine

## 2018-08-13 ENCOUNTER — Ambulatory Visit (INDEPENDENT_AMBULATORY_CARE_PROVIDER_SITE_OTHER): Payer: Medicare Other | Admitting: Sports Medicine

## 2018-08-13 ENCOUNTER — Ambulatory Visit (INDEPENDENT_AMBULATORY_CARE_PROVIDER_SITE_OTHER): Payer: Medicare Other

## 2018-08-13 ENCOUNTER — Other Ambulatory Visit: Payer: Self-pay | Admitting: Sports Medicine

## 2018-08-13 ENCOUNTER — Other Ambulatory Visit: Payer: Self-pay

## 2018-08-13 VITALS — BP 132/75 | HR 79 | Temp 97.8°F | Resp 16

## 2018-08-13 DIAGNOSIS — T148XXA Other injury of unspecified body region, initial encounter: Secondary | ICD-10-CM | POA: Insufficient documentation

## 2018-08-13 DIAGNOSIS — R0789 Other chest pain: Secondary | ICD-10-CM

## 2018-08-13 DIAGNOSIS — Z9889 Other specified postprocedural states: Secondary | ICD-10-CM

## 2018-08-13 DIAGNOSIS — E1142 Type 2 diabetes mellitus with diabetic polyneuropathy: Secondary | ICD-10-CM

## 2018-08-13 DIAGNOSIS — Z89421 Acquired absence of other right toe(s): Secondary | ICD-10-CM

## 2018-08-13 DIAGNOSIS — I509 Heart failure, unspecified: Secondary | ICD-10-CM | POA: Insufficient documentation

## 2018-08-13 DIAGNOSIS — J449 Chronic obstructive pulmonary disease, unspecified: Secondary | ICD-10-CM | POA: Insufficient documentation

## 2018-08-13 DIAGNOSIS — E11621 Type 2 diabetes mellitus with foot ulcer: Secondary | ICD-10-CM

## 2018-08-13 DIAGNOSIS — L97512 Non-pressure chronic ulcer of other part of right foot with fat layer exposed: Secondary | ICD-10-CM

## 2018-08-13 DIAGNOSIS — R0602 Shortness of breath: Secondary | ICD-10-CM

## 2018-08-13 HISTORY — DX: Shortness of breath: R06.02

## 2018-08-13 HISTORY — DX: Other chest pain: R07.89

## 2018-08-13 NOTE — Telephone Encounter (Signed)
-----   Message from [name], [place] sent at 08/13/2018  9:36 AM EDT ----- Regarding: Home Nursing [facility] [place] Apply betadine to dorsal suture line and 1/4 in plain packing to plantar wound covered with dry dressing and ace wrap 2x per week -Dr. Cannon Kettle

## 2018-08-13 NOTE — Telephone Encounter (Signed)
Faxed required form, clinicals and demographics to [facility]. 

## 2018-08-13 NOTE — Progress Notes (Signed)
Subjective: Craig Rollins is a 63 y.o. male patient seen today in office for POV #1 (DOS 08/06/2018), S/P right fourth toe amputation with plantar wound debridement.  Patient denies pain at surgical site, denies calf pain, denies headache, chest pain, shortness of breath, nausea, vomiting, fever, or chills. Patient states that he is doing well and is only taking his pain medicine that he has at home for any pain in the evening that is a dull achy type 5 out of 10. No other issues noted.   Fasting blood sugar this morning was 148 last A1c 7 last saw NP, [name] 3 months ago and will see again on tomorrow  Patient Active Problem List   Diagnosis Date Noted  . Chest discomfort 08/13/2018  . CHF (congestive heart failure) ([place]) 08/13/2018  . COPD (chronic obstructive pulmonary disease) ([place]) 08/13/2018  . Open wound 08/13/2018  . Shortness of breath 08/13/2018  . CAD in native artery 02/18/2018  . Aortic stenosis 02/18/2018  . Essential hypertension 02/18/2018  . Foot swelling 01/26/2018  . Diabetes mellitus, type 2 ([place]) 01/26/2018  . Diabetic peripheral neuropathy ([place]) 02/25/2017  . Hammer toes of both feet 02/25/2017  . Risk for falls 09/23/2016  . NSTEMI (non-ST elevated myocardial infarction) ([place]) 08/30/2016  . Wound eschar of foot 08/21/2016  . Dilated cardiomyopathy ([place]) 07/16/2016  . Dyslipidemia (high LDL; low HDL) 07/16/2016  . Noncompliance 07/16/2016  . Smoking 07/16/2016  . Stroke ([place]) 12/19/2015    Current Outpatient Medications on File Prior to Visit  Medication Sig Dispense Refill  . aspirin EC 81 MG tablet Take 81 mg by mouth daily.    Marland Kitchen atorvastatin (LIPITOR) 80 MG tablet TAKE 1 TABLET BY MOUTH EVERY EVENING.    . clindamycin (CLEOCIN) 300 MG capsule Take 2 capsules (600mg ) 45 minutes prior to dental procedures 10 capsule 1  . clopidogrel (PLAVIX) 75 MG tablet TAKE 1 TABLET BY MOUTH DAILY.    Marland Kitchen lisinopril (PRINIVIL,ZESTRIL) 5 MG tablet Take 5 mg by mouth  daily.    . metFORMIN (GLUCOPHAGE) 1000 MG tablet TAKE ONE TABLET BY MOUTH TWICE DAILY    . nitroGLYCERIN (NITROSTAT) 0.4 MG SL tablet PLACE 1 TABLET (0.4 MG TOTAL) UNDER THE TONGUE EVERY FIVE (5) MINUTES AS NEEDED FOR CHEST PAIN.    Marland Kitchen topiramate (TOPAMAX) 50 MG tablet Take 1 tablet (50 mg total) by mouth daily. 30 tablet 3  . triamcinolone cream (KENALOG) 0.1 % Apply 1 application topically 2 (two) times daily.    Marland Kitchen ezetimibe (ZETIA) 10 MG tablet Take 1 tablet (10 mg total) by mouth daily. 30 tablet 5   No current facility-administered medications on file prior to visit.     Allergies  Allergen Reactions  . Penicillins Nausea And Vomiting  . Milk-Related Compounds Hives and Itching    Objective: There were no vitals filed for this visit.  General: No acute distress, AAOx3  Right foot: Sutures intact with no gapping or dehiscence at surgical site dorsally however plantar ulceration down to fatty layer measures 0.5 x 0.5 x 0.3 cm with a granular base, mild swelling to right foot, no erythema, no warmth, no drainage, no signs of infection noted, Capillary fill time <5 seconds in all remaining digits, gross sensation present via light touch to right foot. No pain or crepitation with range of motion right foot.  No pain with calf compression.   Post Op Xray, Right foot: s/p 4th toe and distal metatarsal amputation. Soft tissue swelling within normal  limits for post op status.   Assessment and Plan:  Problem List Items Addressed This Visit      Nervous and Auditory   Diabetic peripheral neuropathy ([place])    Other Visit Diagnoses    Post-operative state    -  Primary   Relevant Orders   DG Foot Complete Right   S/P amputation of lesser toe, right (HCC)       Diabetic ulcer of toe of right foot associated with type 2 diabetes mellitus, with fat layer exposed ([place])           -Patient seen and evaluated -xrays reviewed  -Applied betadine and packing with dry sterile dressing to  surgical site right foot secured with ACE wrap and stockinet  -Nursing to continue with dressings consistent of the same 2x per week -Advised patient to make sure to keep dressings clean, dry, and intact to right foot -Advised patient to continue with post-op shoe on right foot   -Advised patient to limit activity to necessity  -Advised patient to ice and elevate as necessary  -Will plan for possible suture removal at next office visit. In the meantime, patient to call office if any issues or problems arise.   [name], DPM

## 2018-08-14 DIAGNOSIS — N401 Enlarged prostate with lower urinary tract symptoms: Secondary | ICD-10-CM | POA: Diagnosis not present

## 2018-08-14 DIAGNOSIS — Z09 Encounter for follow-up examination after completed treatment for conditions other than malignant neoplasm: Secondary | ICD-10-CM | POA: Diagnosis not present

## 2018-08-14 DIAGNOSIS — R972 Elevated prostate specific antigen [PSA]: Secondary | ICD-10-CM | POA: Diagnosis not present

## 2018-08-14 DIAGNOSIS — S98131A Complete traumatic amputation of one right lesser toe, initial encounter: Secondary | ICD-10-CM | POA: Diagnosis not present

## 2018-08-15 DIAGNOSIS — I509 Heart failure, unspecified: Secondary | ICD-10-CM | POA: Diagnosis not present

## 2018-08-15 DIAGNOSIS — I110 Hypertensive heart disease with heart failure: Secondary | ICD-10-CM | POA: Diagnosis not present

## 2018-08-15 DIAGNOSIS — Z4781 Encounter for orthopedic aftercare following surgical amputation: Secondary | ICD-10-CM | POA: Diagnosis not present

## 2018-08-15 DIAGNOSIS — I69331 Monoplegia of upper limb following cerebral infarction affecting right dominant side: Secondary | ICD-10-CM | POA: Diagnosis not present

## 2018-08-15 DIAGNOSIS — E1165 Type 2 diabetes mellitus with hyperglycemia: Secondary | ICD-10-CM | POA: Diagnosis not present

## 2018-08-15 DIAGNOSIS — I2510 Atherosclerotic heart disease of native coronary artery without angina pectoris: Secondary | ICD-10-CM | POA: Diagnosis not present

## 2018-08-17 DIAGNOSIS — Z4781 Encounter for orthopedic aftercare following surgical amputation: Secondary | ICD-10-CM | POA: Diagnosis not present

## 2018-08-17 DIAGNOSIS — I509 Heart failure, unspecified: Secondary | ICD-10-CM | POA: Diagnosis not present

## 2018-08-17 DIAGNOSIS — I110 Hypertensive heart disease with heart failure: Secondary | ICD-10-CM | POA: Diagnosis not present

## 2018-08-17 DIAGNOSIS — I2510 Atherosclerotic heart disease of native coronary artery without angina pectoris: Secondary | ICD-10-CM | POA: Diagnosis not present

## 2018-08-17 DIAGNOSIS — I69331 Monoplegia of upper limb following cerebral infarction affecting right dominant side: Secondary | ICD-10-CM | POA: Diagnosis not present

## 2018-08-17 DIAGNOSIS — E1165 Type 2 diabetes mellitus with hyperglycemia: Secondary | ICD-10-CM | POA: Diagnosis not present

## 2018-08-18 ENCOUNTER — Telehealth: Payer: Self-pay | Admitting: *Deleted

## 2018-08-18 ENCOUNTER — Telehealth: Payer: Self-pay | Admitting: Cardiology

## 2018-08-18 NOTE — Telephone Encounter (Signed)
Cancelled echo

## 2018-08-18 NOTE — Telephone Encounter (Signed)
[id number]

## 2018-08-19 DIAGNOSIS — E1165 Type 2 diabetes mellitus with hyperglycemia: Secondary | ICD-10-CM | POA: Diagnosis not present

## 2018-08-19 DIAGNOSIS — I110 Hypertensive heart disease with heart failure: Secondary | ICD-10-CM | POA: Diagnosis not present

## 2018-08-19 DIAGNOSIS — I69331 Monoplegia of upper limb following cerebral infarction affecting right dominant side: Secondary | ICD-10-CM | POA: Diagnosis not present

## 2018-08-19 DIAGNOSIS — I2510 Atherosclerotic heart disease of native coronary artery without angina pectoris: Secondary | ICD-10-CM | POA: Diagnosis not present

## 2018-08-19 DIAGNOSIS — Z4781 Encounter for orthopedic aftercare following surgical amputation: Secondary | ICD-10-CM | POA: Diagnosis not present

## 2018-08-19 DIAGNOSIS — I509 Heart failure, unspecified: Secondary | ICD-10-CM | POA: Diagnosis not present

## 2018-08-20 ENCOUNTER — Ambulatory Visit (INDEPENDENT_AMBULATORY_CARE_PROVIDER_SITE_OTHER): Payer: Medicare Other | Admitting: Sports Medicine

## 2018-08-20 ENCOUNTER — Telehealth: Payer: Self-pay | Admitting: *Deleted

## 2018-08-20 ENCOUNTER — Encounter: Payer: Self-pay | Admitting: Sports Medicine

## 2018-08-20 ENCOUNTER — Other Ambulatory Visit: Payer: Self-pay

## 2018-08-20 VITALS — BP 121/73 | HR 83 | Temp 97.8°F | Resp 16

## 2018-08-20 DIAGNOSIS — L97512 Non-pressure chronic ulcer of other part of right foot with fat layer exposed: Secondary | ICD-10-CM

## 2018-08-20 DIAGNOSIS — Z9889 Other specified postprocedural states: Secondary | ICD-10-CM

## 2018-08-20 DIAGNOSIS — Z89421 Acquired absence of other right toe(s): Secondary | ICD-10-CM

## 2018-08-20 DIAGNOSIS — E1142 Type 2 diabetes mellitus with diabetic polyneuropathy: Secondary | ICD-10-CM

## 2018-08-20 DIAGNOSIS — E11621 Type 2 diabetes mellitus with foot ulcer: Secondary | ICD-10-CM

## 2018-08-20 NOTE — Telephone Encounter (Signed)
-----   Message from [place], [place] sent at 08/20/2018 10:26 AM EDT ----- Regarding: Home wound care Right foot betadine to suture line and packing to plantar wound 3x per week. Patient was seen on 4/2 and can see patient again on 4/4 in home to resume the 3x per week schedule. -Dr. Cannon Kettle

## 2018-08-20 NOTE — Progress Notes (Signed)
Subjective: Craig Rollins is a 63 y.o. male patient seen today in office for POV #2 (DOS 08/06/2018), S/P right fourth toe amputation with plantar wound debridement.  Patient denies pain at surgical site, denies calf pain, denies headache, chest pain, shortness of breath, nausea, vomiting, fever, or chills. Patient states pain is about the same more toward the evening 5-6/10 dull in nature . No other issues noted.   Fasting blood sugar this morning was 151, last A1c 7 last saw NP, [name] last week.   Patient Active Problem List   Diagnosis Date Noted  . Chest discomfort 08/13/2018  . CHF (congestive heart failure) ([place]) 08/13/2018  . COPD (chronic obstructive pulmonary disease) ([place]) 08/13/2018  . Open wound 08/13/2018  . Shortness of breath 08/13/2018  . CAD in native artery 02/18/2018  . Aortic stenosis 02/18/2018  . Essential hypertension 02/18/2018  . Foot swelling 01/26/2018  . Diabetes mellitus, type 2 ([place]) 01/26/2018  . Diabetic peripheral neuropathy ([place]) 02/25/2017  . Hammer toes of both feet 02/25/2017  . Risk for falls 09/23/2016  . NSTEMI (non-ST elevated myocardial infarction) ([place]) 08/30/2016  . Wound eschar of foot 08/21/2016  . Dilated cardiomyopathy ([place]) 07/16/2016  . Dyslipidemia (high LDL; low HDL) 07/16/2016  . Noncompliance 07/16/2016  . Smoking 07/16/2016  . Stroke ([place]) 12/19/2015    Current Outpatient Medications on File Prior to Visit  Medication Sig Dispense Refill  . aspirin EC 81 MG tablet Take 81 mg by mouth daily.    Marland Kitchen atorvastatin (LIPITOR) 40 MG tablet     . atorvastatin (LIPITOR) 80 MG tablet TAKE 1 TABLET BY MOUTH EVERY EVENING.    Marland Kitchen azithromycin (ZITHROMAX) 250 MG tablet     . carvedilol (COREG) 3.125 MG tablet Take 6.25 mg by mouth every 12 (twelve) hours.    . cefdinir (OMNICEF) 300 MG capsule     . clindamycin (CLEOCIN) 150 MG capsule     . clindamycin (CLEOCIN) 300 MG capsule Take 2 capsules (600mg ) 45 minutes prior to dental  procedures 10 capsule 1  . clopidogrel (PLAVIX) 75 MG tablet TAKE 1 TABLET BY MOUTH DAILY.    Marland Kitchen COMBIVENT RESPIMAT 20-100 MCG/ACT AERS respimat     . doxycycline (MONODOX) 100 MG capsule TAKE 1 CAPSULE WITH FOOD TWICE DAILY UNTIL ALL GONE.    Marland Kitchen ezetimibe (ZETIA) 10 MG tablet Take 1 tablet (10 mg total) by mouth daily. 30 tablet 5  . glipiZIDE (GLUCOTROL) 5 MG tablet     . lisinopril (PRINIVIL,ZESTRIL) 5 MG tablet Take 5 mg by mouth daily.    . metFORMIN (GLUCOPHAGE) 1000 MG tablet TAKE ONE TABLET BY MOUTH TWICE DAILY    . nitroGLYCERIN (NITROSTAT) 0.4 MG SL tablet PLACE 1 TABLET (0.4 MG TOTAL) UNDER THE TONGUE EVERY FIVE (5) MINUTES AS NEEDED FOR CHEST PAIN.    Marland Kitchen oxyCODONE (OXY IR/ROXICODONE) 5 MG immediate release tablet     . predniSONE (DELTASONE) 20 MG tablet     . tamsulosin (FLOMAX) 0.4 MG CAPS capsule TAKE 1 CAPSULE DAILY, 30 MINUTES AFTER SUPPER.    Marland Kitchen topiramate (TOPAMAX) 50 MG tablet Take 1 tablet (50 mg total) by mouth daily. 30 tablet 3  . triamcinolone cream (KENALOG) 0.1 % Apply 1 application topically 2 (two) times daily.     No current facility-administered medications on file prior to visit.     Allergies  Allergen Reactions  . Penicillins Nausea And Vomiting  . Milk-Related Compounds Hives and Itching    Objective: There  were no vitals filed for this visit.  General: No acute distress, AAOx3  Right foot: Sutures intact with no gapping or dehiscence at surgical site dorsally however plantar ulceration down to fatty layer measures 0.3 x 0.3 x 0.3 cm with a granular base, improving in size, mild swelling to right foot, no erythema, no warmth, no drainage, no signs of infection noted, Capillary fill time <5 seconds in all remaining digits, gross sensation present via light touch to right foot. No pain or crepitation with range of motion right foot.  No pain with calf compression.   Assessment and Plan:  Problem List Items Addressed This Visit      Nervous and Auditory    Diabetic peripheral neuropathy ([place])    Other Visit Diagnoses    Post-operative state    -  Primary   S/P amputation of lesser toe, right (HCC)       Diabetic ulcer of toe of right foot associated with type 2 diabetes mellitus, with fat layer exposed ([place])           -Patient seen and evaluatedewed  -Applied betadine and packing with dry sterile dressing to surgical site right foot secured with ACE wrap and stockinet  -Nursing to continue with dressings consistent of the same 2 to 3x per week -Advised patient to make sure to keep dressings clean, dry, and intact to right foot -Advised patient to continue with post-op shoe on right foot   -Advised patient to limit activity to necessity  -Advised patient to ice and elevate as necessary to assist with pain and edema control -Will plan for suture removal and will complete diabetic shoe paperwork at next office visit. In the meantime, patient to call office if any issues or problems arise.   [name], DPM

## 2018-08-20 NOTE — Telephone Encounter (Signed)
Faxed Dr. Leeanne Rio 08/20/2018 10:26am orders to [facility].

## 2018-08-22 DIAGNOSIS — I2510 Atherosclerotic heart disease of native coronary artery without angina pectoris: Secondary | ICD-10-CM | POA: Diagnosis not present

## 2018-08-22 DIAGNOSIS — Z4781 Encounter for orthopedic aftercare following surgical amputation: Secondary | ICD-10-CM | POA: Diagnosis not present

## 2018-08-22 DIAGNOSIS — I69331 Monoplegia of upper limb following cerebral infarction affecting right dominant side: Secondary | ICD-10-CM | POA: Diagnosis not present

## 2018-08-22 DIAGNOSIS — I509 Heart failure, unspecified: Secondary | ICD-10-CM | POA: Diagnosis not present

## 2018-08-22 DIAGNOSIS — E1165 Type 2 diabetes mellitus with hyperglycemia: Secondary | ICD-10-CM | POA: Diagnosis not present

## 2018-08-22 DIAGNOSIS — I110 Hypertensive heart disease with heart failure: Secondary | ICD-10-CM | POA: Diagnosis not present

## 2018-08-24 DIAGNOSIS — I509 Heart failure, unspecified: Secondary | ICD-10-CM | POA: Diagnosis not present

## 2018-08-24 DIAGNOSIS — I69331 Monoplegia of upper limb following cerebral infarction affecting right dominant side: Secondary | ICD-10-CM | POA: Diagnosis not present

## 2018-08-24 DIAGNOSIS — E1165 Type 2 diabetes mellitus with hyperglycemia: Secondary | ICD-10-CM | POA: Diagnosis not present

## 2018-08-24 DIAGNOSIS — I2510 Atherosclerotic heart disease of native coronary artery without angina pectoris: Secondary | ICD-10-CM | POA: Diagnosis not present

## 2018-08-24 DIAGNOSIS — I110 Hypertensive heart disease with heart failure: Secondary | ICD-10-CM | POA: Diagnosis not present

## 2018-08-24 DIAGNOSIS — Z4781 Encounter for orthopedic aftercare following surgical amputation: Secondary | ICD-10-CM | POA: Diagnosis not present

## 2018-08-26 DIAGNOSIS — I69331 Monoplegia of upper limb following cerebral infarction affecting right dominant side: Secondary | ICD-10-CM | POA: Diagnosis not present

## 2018-08-26 DIAGNOSIS — I110 Hypertensive heart disease with heart failure: Secondary | ICD-10-CM | POA: Diagnosis not present

## 2018-08-26 DIAGNOSIS — I2510 Atherosclerotic heart disease of native coronary artery without angina pectoris: Secondary | ICD-10-CM | POA: Diagnosis not present

## 2018-08-26 DIAGNOSIS — E1165 Type 2 diabetes mellitus with hyperglycemia: Secondary | ICD-10-CM | POA: Diagnosis not present

## 2018-08-26 DIAGNOSIS — Z4781 Encounter for orthopedic aftercare following surgical amputation: Secondary | ICD-10-CM | POA: Diagnosis not present

## 2018-08-26 DIAGNOSIS — I509 Heart failure, unspecified: Secondary | ICD-10-CM | POA: Diagnosis not present

## 2018-08-28 DIAGNOSIS — I110 Hypertensive heart disease with heart failure: Secondary | ICD-10-CM | POA: Diagnosis not present

## 2018-08-28 DIAGNOSIS — I69331 Monoplegia of upper limb following cerebral infarction affecting right dominant side: Secondary | ICD-10-CM | POA: Diagnosis not present

## 2018-08-28 DIAGNOSIS — I509 Heart failure, unspecified: Secondary | ICD-10-CM | POA: Diagnosis not present

## 2018-08-28 DIAGNOSIS — I2510 Atherosclerotic heart disease of native coronary artery without angina pectoris: Secondary | ICD-10-CM | POA: Diagnosis not present

## 2018-08-28 DIAGNOSIS — Z4781 Encounter for orthopedic aftercare following surgical amputation: Secondary | ICD-10-CM | POA: Diagnosis not present

## 2018-08-28 DIAGNOSIS — E1165 Type 2 diabetes mellitus with hyperglycemia: Secondary | ICD-10-CM | POA: Diagnosis not present

## 2018-08-30 DIAGNOSIS — I509 Heart failure, unspecified: Secondary | ICD-10-CM | POA: Diagnosis not present

## 2018-08-30 DIAGNOSIS — I110 Hypertensive heart disease with heart failure: Secondary | ICD-10-CM | POA: Diagnosis not present

## 2018-08-30 DIAGNOSIS — I2510 Atherosclerotic heart disease of native coronary artery without angina pectoris: Secondary | ICD-10-CM | POA: Diagnosis not present

## 2018-08-30 DIAGNOSIS — E1165 Type 2 diabetes mellitus with hyperglycemia: Secondary | ICD-10-CM | POA: Diagnosis not present

## 2018-08-30 DIAGNOSIS — Z4781 Encounter for orthopedic aftercare following surgical amputation: Secondary | ICD-10-CM | POA: Diagnosis not present

## 2018-08-30 DIAGNOSIS — I69331 Monoplegia of upper limb following cerebral infarction affecting right dominant side: Secondary | ICD-10-CM | POA: Diagnosis not present

## 2018-08-31 ENCOUNTER — Other Ambulatory Visit: Payer: Medicare Other

## 2018-09-01 ENCOUNTER — Telehealth: Payer: Self-pay | Admitting: *Deleted

## 2018-09-01 DIAGNOSIS — Z4781 Encounter for orthopedic aftercare following surgical amputation: Secondary | ICD-10-CM | POA: Diagnosis not present

## 2018-09-01 DIAGNOSIS — E1165 Type 2 diabetes mellitus with hyperglycemia: Secondary | ICD-10-CM | POA: Diagnosis not present

## 2018-09-01 DIAGNOSIS — I110 Hypertensive heart disease with heart failure: Secondary | ICD-10-CM | POA: Diagnosis not present

## 2018-09-01 DIAGNOSIS — I69331 Monoplegia of upper limb following cerebral infarction affecting right dominant side: Secondary | ICD-10-CM | POA: Diagnosis not present

## 2018-09-01 DIAGNOSIS — I2510 Atherosclerotic heart disease of native coronary artery without angina pectoris: Secondary | ICD-10-CM | POA: Diagnosis not present

## 2018-09-01 DIAGNOSIS — I509 Heart failure, unspecified: Secondary | ICD-10-CM | POA: Diagnosis not present

## 2018-09-01 NOTE — Telephone Encounter (Signed)
Craig Rollins home health pulled out packing and a creamy yellowish drainage came out has not seen before.  Pressed as much drainage out as patient could tolerate and redressed no odor or redness noted.

## 2018-09-01 NOTE — Telephone Encounter (Signed)
Ok I will re-assess him at next visit to let nurse know if we need to make any changes to the wound care -Dr. Chauncey Cruel

## 2018-09-03 ENCOUNTER — Ambulatory Visit (INDEPENDENT_AMBULATORY_CARE_PROVIDER_SITE_OTHER): Payer: Medicare Other | Admitting: Sports Medicine

## 2018-09-03 ENCOUNTER — Encounter: Payer: Medicare Other | Admitting: Sports Medicine

## 2018-09-03 ENCOUNTER — Other Ambulatory Visit: Payer: Self-pay

## 2018-09-03 ENCOUNTER — Telehealth: Payer: Self-pay | Admitting: *Deleted

## 2018-09-03 ENCOUNTER — Encounter: Payer: Self-pay | Admitting: Sports Medicine

## 2018-09-03 VITALS — BP 143/85 | HR 69 | Temp 96.7°F | Resp 16

## 2018-09-03 DIAGNOSIS — Z9889 Other specified postprocedural states: Secondary | ICD-10-CM | POA: Diagnosis not present

## 2018-09-03 DIAGNOSIS — L02619 Cutaneous abscess of unspecified foot: Secondary | ICD-10-CM | POA: Diagnosis not present

## 2018-09-03 DIAGNOSIS — L03119 Cellulitis of unspecified part of limb: Secondary | ICD-10-CM

## 2018-09-03 DIAGNOSIS — E08621 Diabetes mellitus due to underlying condition with foot ulcer: Secondary | ICD-10-CM

## 2018-09-03 DIAGNOSIS — Z89421 Acquired absence of other right toe(s): Secondary | ICD-10-CM

## 2018-09-03 DIAGNOSIS — L97512 Non-pressure chronic ulcer of other part of right foot with fat layer exposed: Secondary | ICD-10-CM

## 2018-09-03 MED ORDER — SULFAMETHOXAZOLE-TRIMETHOPRIM 400-80 MG PO TABS: 1.0000 | ORAL_TABLET | Freq: Two times a day (BID) | ORAL | 0 refills | Status: DC

## 2018-09-03 NOTE — Telephone Encounter (Signed)
Called and spoke with Sharyn Lull and gave her Dr Leeanne Rio updated wound care orders

## 2018-09-03 NOTE — Progress Notes (Signed)
Subjective: Craig Rollins is a 63 y.o. male patient seen today in office for POV #3 (DOS 08/06/2018), S/P right fourth toe amputation with plantar wound debridement.  Patient denies pain at surgical site however does note that his nurse on Tuesday noticed some drainage from the wound and states that he gets a pain every once in a while 4 out of 10 states that he has been elevating icing and using surgical shoe and taking his Percocet as needed patient reports his blood sugar this morning was 197 last A1c of 7 and last saw his primary care doctor last month.  Patient denies nausea vomiting fever chills or any other constitutional symptoms at this time.  No other issues noted.   Patient Active Problem List   Diagnosis Date Noted  . Chest discomfort 08/13/2018  . CHF (congestive heart failure) ([place]) 08/13/2018  . COPD (chronic obstructive pulmonary disease) ([place]) 08/13/2018  . Open wound 08/13/2018  . Shortness of breath 08/13/2018  . CAD in native artery 02/18/2018  . Aortic stenosis 02/18/2018  . Essential hypertension 02/18/2018  . Foot swelling 01/26/2018  . Diabetes mellitus, type 2 ([place]) 01/26/2018  . Diabetic peripheral neuropathy ([place]) 02/25/2017  . Hammer toes of both feet 02/25/2017  . Risk for falls 09/23/2016  . NSTEMI (non-ST elevated myocardial infarction) ([place]) 08/30/2016  . Wound eschar of foot 08/21/2016  . Dilated cardiomyopathy ([place]) 07/16/2016  . Dyslipidemia (high LDL; low HDL) 07/16/2016  . Noncompliance 07/16/2016  . Smoking 07/16/2016  . Stroke ([place]) 12/19/2015    Current Outpatient Medications on File Prior to Visit  Medication Sig Dispense Refill  . aspirin EC 81 MG tablet Take 81 mg by mouth daily.    Marland Kitchen atorvastatin (LIPITOR) 40 MG tablet     . atorvastatin (LIPITOR) 80 MG tablet TAKE 1 TABLET BY MOUTH EVERY EVENING.    Marland Kitchen azithromycin (ZITHROMAX) 250 MG tablet     . carvedilol (COREG) 3.125 MG tablet Take 6.25 mg by mouth every 12 (twelve) hours.    .  cefdinir (OMNICEF) 300 MG capsule     . clindamycin (CLEOCIN) 150 MG capsule     . clindamycin (CLEOCIN) 300 MG capsule Take 2 capsules (600mg ) 45 minutes prior to dental procedures 10 capsule 1  . clopidogrel (PLAVIX) 75 MG tablet TAKE 1 TABLET BY MOUTH DAILY.    Marland Kitchen COMBIVENT RESPIMAT 20-100 MCG/ACT AERS respimat     . doxycycline (MONODOX) 100 MG capsule TAKE 1 CAPSULE WITH FOOD TWICE DAILY UNTIL ALL GONE.    Marland Kitchen glipiZIDE (GLUCOTROL) 5 MG tablet     . lisinopril (PRINIVIL,ZESTRIL) 5 MG tablet Take 5 mg by mouth daily.    . metFORMIN (GLUCOPHAGE) 1000 MG tablet TAKE ONE TABLET BY MOUTH TWICE DAILY    . nitroGLYCERIN (NITROSTAT) 0.4 MG SL tablet PLACE 1 TABLET (0.4 MG TOTAL) UNDER THE TONGUE EVERY FIVE (5) MINUTES AS NEEDED FOR CHEST PAIN.    Marland Kitchen oxyCODONE (OXY IR/ROXICODONE) 5 MG immediate release tablet     . predniSONE (DELTASONE) 20 MG tablet     . tamsulosin (FLOMAX) 0.4 MG CAPS capsule TAKE 1 CAPSULE DAILY, 30 MINUTES AFTER SUPPER.    Marland Kitchen topiramate (TOPAMAX) 50 MG tablet Take 1 tablet (50 mg total) by mouth daily. 30 tablet 3  . triamcinolone cream (KENALOG) 0.1 % Apply 1 application topically 2 (two) times daily.    Marland Kitchen ezetimibe (ZETIA) 10 MG tablet Take 1 tablet (10 mg total) by mouth daily. 30 tablet 5  No current facility-administered medications on file prior to visit.     Allergies  Allergen Reactions  . Penicillins Nausea And Vomiting  . Milk-Related Compounds Hives and Itching    Objective: There were no vitals filed for this visit.  General: No acute distress, AAOx3  Right foot: Sutures intact with no gapping or dehiscence at surgical site dorsally however plantar ulceration down to fatty layer measures 0.3 x 0.3 x 1 cm that probes to dorsal incision line with a granular base and mucus drainage,mild swelling to right foot, no erythema, no warmth, no malodor, no other signs of infection noted, Capillary fill time <5 seconds in all remaining digits, gross sensation present via  light touch to right foot. No pain or crepitation with range of motion right foot.  No pain with calf compression.   Assessment and Plan:  Problem List Items Addressed This Visit    None    Visit Diagnoses    Cellulitis and abscess of foot, except toes    -  Primary   Relevant Orders   WOUND CULTURE   Diabetic ulcer of other part of right foot associated with diabetes mellitus due to underlying condition, with fat layer exposed ([place])       Relevant Orders   WOUND CULTURE   S/P amputation of lesser toe, right ([place])       Relevant Orders   WOUND CULTURE   Post-operative state       Relevant Orders   WOUND CULTURE       -Patient seen and evaluated -Mucoid drainage was cultured and sent to microbiology and restarted patient on Bactrim antibiotic meanwhile -Applied betadine soaked gauze packing with dry sterile dressing to surgical site right foot secured with ACE wrap and stockinet  -Nursing to continue with dressings consistent of the same 2 to 3x per week -Advised patient to make sure to keep dressings clean, dry, and intact to right foot -Advised patient to continue with post-op shoe on right foot   -Advised patient to limit activity to necessity  -Advised patient to ice and elevate as necessary to assist with pain and edema control -Will plan for suture removal and will complete diabetic shoe paperwork at next office visit. In the meantime, patient to call office if any issues or problems arise.  Do not remove sutures at this visit due to drainage will reassess next week.  [name], DPM

## 2018-09-03 NOTE — Telephone Encounter (Signed)
-----   Message from [name], [place] sent at 09/03/2018 11:13 AM EDT ----- Regarding: Home nursing wound care Please flush plantar wound with saline or cleansing spray, apply betadine to a sterile guaze and pack into wound bed using a q tip on the plantar surface and on the dorsal surface over the sutures apply a small antibiotic cream/neosporin cover with dry dressing 3x per week. Patient will be re-assessed on next week Thanks Dr Chauncey Cruel

## 2018-09-05 DIAGNOSIS — Z4781 Encounter for orthopedic aftercare following surgical amputation: Secondary | ICD-10-CM | POA: Diagnosis not present

## 2018-09-05 DIAGNOSIS — I110 Hypertensive heart disease with heart failure: Secondary | ICD-10-CM | POA: Diagnosis not present

## 2018-09-05 DIAGNOSIS — I69331 Monoplegia of upper limb following cerebral infarction affecting right dominant side: Secondary | ICD-10-CM | POA: Diagnosis not present

## 2018-09-05 DIAGNOSIS — I509 Heart failure, unspecified: Secondary | ICD-10-CM | POA: Diagnosis not present

## 2018-09-05 DIAGNOSIS — I2510 Atherosclerotic heart disease of native coronary artery without angina pectoris: Secondary | ICD-10-CM | POA: Diagnosis not present

## 2018-09-05 DIAGNOSIS — E1165 Type 2 diabetes mellitus with hyperglycemia: Secondary | ICD-10-CM | POA: Diagnosis not present

## 2018-09-06 LAB — WOUND CULTURE

## 2018-09-07 DIAGNOSIS — E1165 Type 2 diabetes mellitus with hyperglycemia: Secondary | ICD-10-CM | POA: Diagnosis not present

## 2018-09-07 DIAGNOSIS — I110 Hypertensive heart disease with heart failure: Secondary | ICD-10-CM | POA: Diagnosis not present

## 2018-09-07 DIAGNOSIS — I69331 Monoplegia of upper limb following cerebral infarction affecting right dominant side: Secondary | ICD-10-CM | POA: Diagnosis not present

## 2018-09-07 DIAGNOSIS — I509 Heart failure, unspecified: Secondary | ICD-10-CM | POA: Diagnosis not present

## 2018-09-07 DIAGNOSIS — Z4781 Encounter for orthopedic aftercare following surgical amputation: Secondary | ICD-10-CM | POA: Diagnosis not present

## 2018-09-07 DIAGNOSIS — I2510 Atherosclerotic heart disease of native coronary artery without angina pectoris: Secondary | ICD-10-CM | POA: Diagnosis not present

## 2018-09-08 ENCOUNTER — Telehealth: Payer: Self-pay | Admitting: *Deleted

## 2018-09-08 MED ORDER — LEVOFLOXACIN 500 MG PO TABS: 500.0000 mg | ORAL_TABLET | Freq: Every day | ORAL | 0 refills | Status: DC

## 2018-09-08 NOTE — Telephone Encounter (Signed)
I informed pt of Dr. Stover's review of results and orders. Pt states understanding. 

## 2018-09-08 NOTE — Telephone Encounter (Signed)
-----   Message from [name], [place] sent at 09/07/2018  6:41 PM EDT ----- Please send Levaquin 500mg  daily for 2 weeks to patient pharmacy and advise him to stop Bactrim Thanks Dr. Chauncey Cruel.

## 2018-09-09 DIAGNOSIS — I252 Old myocardial infarction: Secondary | ICD-10-CM | POA: Diagnosis not present

## 2018-09-09 DIAGNOSIS — E1165 Type 2 diabetes mellitus with hyperglycemia: Secondary | ICD-10-CM | POA: Diagnosis not present

## 2018-09-09 DIAGNOSIS — Z4781 Encounter for orthopedic aftercare following surgical amputation: Secondary | ICD-10-CM | POA: Diagnosis not present

## 2018-09-09 DIAGNOSIS — I69331 Monoplegia of upper limb following cerebral infarction affecting right dominant side: Secondary | ICD-10-CM | POA: Diagnosis not present

## 2018-09-09 DIAGNOSIS — F17210 Nicotine dependence, cigarettes, uncomplicated: Secondary | ICD-10-CM | POA: Diagnosis not present

## 2018-09-09 DIAGNOSIS — E785 Hyperlipidemia, unspecified: Secondary | ICD-10-CM | POA: Diagnosis not present

## 2018-09-09 DIAGNOSIS — I2510 Atherosclerotic heart disease of native coronary artery without angina pectoris: Secondary | ICD-10-CM | POA: Diagnosis not present

## 2018-09-09 DIAGNOSIS — I509 Heart failure, unspecified: Secondary | ICD-10-CM | POA: Diagnosis not present

## 2018-09-09 DIAGNOSIS — I110 Hypertensive heart disease with heart failure: Secondary | ICD-10-CM | POA: Diagnosis not present

## 2018-09-09 DIAGNOSIS — M1991 Primary osteoarthritis, unspecified site: Secondary | ICD-10-CM | POA: Diagnosis not present

## 2018-09-09 DIAGNOSIS — Z7984 Long term (current) use of oral hypoglycemic drugs: Secondary | ICD-10-CM | POA: Diagnosis not present

## 2018-09-09 DIAGNOSIS — Z7902 Long term (current) use of antithrombotics/antiplatelets: Secondary | ICD-10-CM | POA: Diagnosis not present

## 2018-09-09 DIAGNOSIS — J449 Chronic obstructive pulmonary disease, unspecified: Secondary | ICD-10-CM | POA: Diagnosis not present

## 2018-09-09 DIAGNOSIS — Z89421 Acquired absence of other right toe(s): Secondary | ICD-10-CM | POA: Diagnosis not present

## 2018-09-10 ENCOUNTER — Ambulatory Visit (INDEPENDENT_AMBULATORY_CARE_PROVIDER_SITE_OTHER): Payer: Medicare Other | Admitting: Sports Medicine

## 2018-09-10 ENCOUNTER — Encounter: Payer: Self-pay | Admitting: Sports Medicine

## 2018-09-10 ENCOUNTER — Other Ambulatory Visit: Payer: Self-pay

## 2018-09-10 VITALS — BP 129/79 | HR 88 | Temp 96.0°F | Resp 16

## 2018-09-10 DIAGNOSIS — L97512 Non-pressure chronic ulcer of other part of right foot with fat layer exposed: Secondary | ICD-10-CM

## 2018-09-10 DIAGNOSIS — L03119 Cellulitis of unspecified part of limb: Secondary | ICD-10-CM

## 2018-09-10 DIAGNOSIS — Z9889 Other specified postprocedural states: Secondary | ICD-10-CM

## 2018-09-10 DIAGNOSIS — E1142 Type 2 diabetes mellitus with diabetic polyneuropathy: Secondary | ICD-10-CM

## 2018-09-10 DIAGNOSIS — E08621 Diabetes mellitus due to underlying condition with foot ulcer: Secondary | ICD-10-CM

## 2018-09-10 DIAGNOSIS — Z89421 Acquired absence of other right toe(s): Secondary | ICD-10-CM

## 2018-09-10 DIAGNOSIS — L02619 Cutaneous abscess of unspecified foot: Secondary | ICD-10-CM

## 2018-09-10 NOTE — Progress Notes (Addendum)
Subjective: Craig Rollins is a 63 y.o. male patient seen today in office for POV #4 (DOS 08/06/2018), S/P right fourth toe amputation with plantar wound debridement.  Patient admits a little pain every now and again 3 out of 10 states that he has been taking his antibiotic with no issue and reports that his wound looks good so far. Yesterday there was no drainage when his nurse changed it.  Patient denies nausea vomiting fever chills or any other constitutional symptoms at this time.  No other issues noted.   Patient Active Problem List   Diagnosis Date Noted  . Chest discomfort 08/13/2018  . CHF (congestive heart failure) ([place]) 08/13/2018  . COPD (chronic obstructive pulmonary disease) ([place]) 08/13/2018  . Open wound 08/13/2018  . Shortness of breath 08/13/2018  . CAD in native artery 02/18/2018  . Aortic stenosis 02/18/2018  . Essential hypertension 02/18/2018  . Foot swelling 01/26/2018  . Diabetes mellitus, type 2 ([place]) 01/26/2018  . Diabetic peripheral neuropathy ([place]) 02/25/2017  . Hammer toes of both feet 02/25/2017  . Risk for falls 09/23/2016  . NSTEMI (non-ST elevated myocardial infarction) ([place]) 08/30/2016  . Wound eschar of foot 08/21/2016  . Dilated cardiomyopathy ([place]) 07/16/2016  . Dyslipidemia (high LDL; low HDL) 07/16/2016  . Noncompliance 07/16/2016  . Smoking 07/16/2016  . Stroke ([place]) 12/19/2015    Current Outpatient Medications on File Prior to Visit  Medication Sig Dispense Refill  . aspirin EC 81 MG tablet Take 81 mg by mouth daily.    Marland Kitchen atorvastatin (LIPITOR) 40 MG tablet     . atorvastatin (LIPITOR) 80 MG tablet TAKE 1 TABLET BY MOUTH EVERY EVENING.    Marland Kitchen azithromycin (ZITHROMAX) 250 MG tablet     . carvedilol (COREG) 3.125 MG tablet Take 6.25 mg by mouth every 12 (twelve) hours.    . cefdinir (OMNICEF) 300 MG capsule     . clindamycin (CLEOCIN) 150 MG capsule     . clindamycin (CLEOCIN) 300 MG capsule Take 2 capsules (600mg ) 45 minutes prior to dental  procedures 10 capsule 1  . clopidogrel (PLAVIX) 75 MG tablet TAKE 1 TABLET BY MOUTH DAILY.    Marland Kitchen COMBIVENT RESPIMAT 20-100 MCG/ACT AERS respimat     . doxycycline (MONODOX) 100 MG capsule TAKE 1 CAPSULE WITH FOOD TWICE DAILY UNTIL ALL GONE.    Marland Kitchen glipiZIDE (GLUCOTROL) 5 MG tablet     . levofloxacin (LEVAQUIN) 500 MG tablet Take 1 tablet (500 mg total) by mouth daily. 14 tablet 0  . lisinopril (PRINIVIL,ZESTRIL) 5 MG tablet Take 5 mg by mouth daily.    . metFORMIN (GLUCOPHAGE) 1000 MG tablet TAKE ONE TABLET BY MOUTH TWICE DAILY    . nitroGLYCERIN (NITROSTAT) 0.4 MG SL tablet PLACE 1 TABLET (0.4 MG TOTAL) UNDER THE TONGUE EVERY FIVE (5) MINUTES AS NEEDED FOR CHEST PAIN.    Marland Kitchen oxyCODONE (OXY IR/ROXICODONE) 5 MG immediate release tablet     . predniSONE (DELTASONE) 20 MG tablet     . sulfamethoxazole-trimethoprim (BACTRIM) 400-80 MG tablet Take 1 tablet by mouth 2 (two) times daily. 28 tablet 0  . tamsulosin (FLOMAX) 0.4 MG CAPS capsule TAKE 1 CAPSULE DAILY, 30 MINUTES AFTER SUPPER.    Marland Kitchen topiramate (TOPAMAX) 50 MG tablet Take 1 tablet (50 mg total) by mouth daily. 30 tablet 3  . triamcinolone cream (KENALOG) 0.1 % Apply 1 application topically 2 (two) times daily.    Marland Kitchen ezetimibe (ZETIA) 10 MG tablet Take 1 tablet (10 mg total) by mouth  daily. 30 tablet 5   No current facility-administered medications on file prior to visit.     Allergies  Allergen Reactions  . Penicillins Nausea And Vomiting  . Milk-Related Compounds Hives and Itching    Objective: There were no vitals filed for this visit.  General: No acute distress, AAOx3  Right foot: Sutures intact with no gapping or dehiscence at surgical site dorsally however plantar ulceration down to fatty layer measures 0.3 x 0.3 x 0.7 cm that probes to dorsal incision line but less than last week with a granular base and no drainage,mild swelling to right foot, no erythema, no warmth, no malodor, no other signs of infection noted, Capillary fill time  <5 seconds in all remaining digits, gross sensation present via light touch to right foot. No pain or crepitation with range of motion right foot.  + planus and hammertoe deformity. S/p R 4th toe amp. No pain with calf compression.   Assessment and Plan:  Problem List Items Addressed This Visit      Nervous and Auditory   Diabetic peripheral neuropathy ([place])    Other Visit Diagnoses    Diabetic ulcer of other part of right foot associated with diabetes mellitus due to underlying condition, with fat layer exposed ([place])    -  Primary   Cellulitis and abscess of foot, except toes       S/P amputation of lesser toe, right ([place])       Post-operative state           -Patient seen and evaluated -Sutures were removed  -Applied betadine soaked gauze packing with dry sterile dressing to surgical site right foot secured with ACE wrap and stockinet  -Nursing to continue with dressings consistent of the same 2 to 3x per week -Continue with Bactrim -Advised patient to make sure to keep dressings clean, dry, and intact to right foot -Advised patient to continue with post-op shoe on right foot   -Advised patient to limit activity to necessity  -Advised patient to ice and elevate as necessary to assist with pain and edema control -Safe step diabetic shoe order form was completed; office to contact primary care for approval / certification;  Office to arrange shoe fitting and dispensing when wound is healed. -Will plan for wound check at next office visit. In the meantime, patient to call office if any issues or problems arise.   [name], DPM

## 2018-09-14 DIAGNOSIS — I110 Hypertensive heart disease with heart failure: Secondary | ICD-10-CM | POA: Diagnosis not present

## 2018-09-14 DIAGNOSIS — I2510 Atherosclerotic heart disease of native coronary artery without angina pectoris: Secondary | ICD-10-CM | POA: Diagnosis not present

## 2018-09-14 DIAGNOSIS — Z4781 Encounter for orthopedic aftercare following surgical amputation: Secondary | ICD-10-CM | POA: Diagnosis not present

## 2018-09-14 DIAGNOSIS — E1165 Type 2 diabetes mellitus with hyperglycemia: Secondary | ICD-10-CM | POA: Diagnosis not present

## 2018-09-14 DIAGNOSIS — I509 Heart failure, unspecified: Secondary | ICD-10-CM | POA: Diagnosis not present

## 2018-09-14 DIAGNOSIS — I69331 Monoplegia of upper limb following cerebral infarction affecting right dominant side: Secondary | ICD-10-CM | POA: Diagnosis not present

## 2018-09-16 DIAGNOSIS — E1165 Type 2 diabetes mellitus with hyperglycemia: Secondary | ICD-10-CM | POA: Diagnosis not present

## 2018-09-16 DIAGNOSIS — I509 Heart failure, unspecified: Secondary | ICD-10-CM | POA: Diagnosis not present

## 2018-09-16 DIAGNOSIS — Z4781 Encounter for orthopedic aftercare following surgical amputation: Secondary | ICD-10-CM | POA: Diagnosis not present

## 2018-09-16 DIAGNOSIS — I69331 Monoplegia of upper limb following cerebral infarction affecting right dominant side: Secondary | ICD-10-CM | POA: Diagnosis not present

## 2018-09-16 DIAGNOSIS — I2510 Atherosclerotic heart disease of native coronary artery without angina pectoris: Secondary | ICD-10-CM | POA: Diagnosis not present

## 2018-09-16 DIAGNOSIS — I110 Hypertensive heart disease with heart failure: Secondary | ICD-10-CM | POA: Diagnosis not present

## 2018-09-18 DIAGNOSIS — I110 Hypertensive heart disease with heart failure: Secondary | ICD-10-CM | POA: Diagnosis not present

## 2018-09-18 DIAGNOSIS — Z4781 Encounter for orthopedic aftercare following surgical amputation: Secondary | ICD-10-CM | POA: Diagnosis not present

## 2018-09-18 DIAGNOSIS — I69331 Monoplegia of upper limb following cerebral infarction affecting right dominant side: Secondary | ICD-10-CM | POA: Diagnosis not present

## 2018-09-18 DIAGNOSIS — I2510 Atherosclerotic heart disease of native coronary artery without angina pectoris: Secondary | ICD-10-CM | POA: Diagnosis not present

## 2018-09-18 DIAGNOSIS — E1165 Type 2 diabetes mellitus with hyperglycemia: Secondary | ICD-10-CM | POA: Diagnosis not present

## 2018-09-18 DIAGNOSIS — I509 Heart failure, unspecified: Secondary | ICD-10-CM | POA: Diagnosis not present

## 2018-09-21 DIAGNOSIS — I2510 Atherosclerotic heart disease of native coronary artery without angina pectoris: Secondary | ICD-10-CM | POA: Diagnosis not present

## 2018-09-21 DIAGNOSIS — I69331 Monoplegia of upper limb following cerebral infarction affecting right dominant side: Secondary | ICD-10-CM | POA: Diagnosis not present

## 2018-09-21 DIAGNOSIS — Z4781 Encounter for orthopedic aftercare following surgical amputation: Secondary | ICD-10-CM | POA: Diagnosis not present

## 2018-09-21 DIAGNOSIS — I509 Heart failure, unspecified: Secondary | ICD-10-CM | POA: Diagnosis not present

## 2018-09-21 DIAGNOSIS — E1165 Type 2 diabetes mellitus with hyperglycemia: Secondary | ICD-10-CM | POA: Diagnosis not present

## 2018-09-21 DIAGNOSIS — I110 Hypertensive heart disease with heart failure: Secondary | ICD-10-CM | POA: Diagnosis not present

## 2018-09-23 DIAGNOSIS — I69331 Monoplegia of upper limb following cerebral infarction affecting right dominant side: Secondary | ICD-10-CM | POA: Diagnosis not present

## 2018-09-23 DIAGNOSIS — N401 Enlarged prostate with lower urinary tract symptoms: Secondary | ICD-10-CM | POA: Diagnosis not present

## 2018-09-23 DIAGNOSIS — R972 Elevated prostate specific antigen [PSA]: Secondary | ICD-10-CM | POA: Diagnosis not present

## 2018-09-23 DIAGNOSIS — I509 Heart failure, unspecified: Secondary | ICD-10-CM | POA: Diagnosis not present

## 2018-09-23 DIAGNOSIS — E1165 Type 2 diabetes mellitus with hyperglycemia: Secondary | ICD-10-CM | POA: Diagnosis not present

## 2018-09-23 DIAGNOSIS — I2510 Atherosclerotic heart disease of native coronary artery without angina pectoris: Secondary | ICD-10-CM | POA: Diagnosis not present

## 2018-09-23 DIAGNOSIS — I110 Hypertensive heart disease with heart failure: Secondary | ICD-10-CM | POA: Diagnosis not present

## 2018-09-23 DIAGNOSIS — Z4781 Encounter for orthopedic aftercare following surgical amputation: Secondary | ICD-10-CM | POA: Diagnosis not present

## 2018-09-24 ENCOUNTER — Other Ambulatory Visit: Payer: Self-pay

## 2018-09-24 ENCOUNTER — Ambulatory Visit (INDEPENDENT_AMBULATORY_CARE_PROVIDER_SITE_OTHER): Payer: Medicare Other | Admitting: Sports Medicine

## 2018-09-24 ENCOUNTER — Telehealth: Payer: Self-pay | Admitting: *Deleted

## 2018-09-24 ENCOUNTER — Encounter: Payer: Self-pay | Admitting: Sports Medicine

## 2018-09-24 VITALS — BP 124/69 | HR 75 | Temp 97.5°F | Resp 16

## 2018-09-24 DIAGNOSIS — L02619 Cutaneous abscess of unspecified foot: Secondary | ICD-10-CM

## 2018-09-24 DIAGNOSIS — E08621 Diabetes mellitus due to underlying condition with foot ulcer: Secondary | ICD-10-CM

## 2018-09-24 DIAGNOSIS — L03119 Cellulitis of unspecified part of limb: Secondary | ICD-10-CM

## 2018-09-24 DIAGNOSIS — L97512 Non-pressure chronic ulcer of other part of right foot with fat layer exposed: Secondary | ICD-10-CM

## 2018-09-24 DIAGNOSIS — Z9889 Other specified postprocedural states: Secondary | ICD-10-CM

## 2018-09-24 DIAGNOSIS — Z89421 Acquired absence of other right toe(s): Secondary | ICD-10-CM

## 2018-09-24 NOTE — Telephone Encounter (Signed)
Faxed orders to [facility]. 

## 2018-09-24 NOTE — Progress Notes (Signed)
Subjective: Craig Rollins is a 63 y.o. male patient seen today in office for POV # 5 (DOS 08/06/2018), S/P right fourth toe amputation with plantar wound debridement.  Patient reports that his right foot is doing good and looking better there is no drainage warmth redness swelling the nurse has been applying Betadine packing to the area and having the foot wrapped with surgical shoe and elevating patient reports that his blood sugar this morning was 189 does not remember last A1c and PCP visit.  Patient denies nausea vomiting fever chills or any other constitutional symptoms at this time.  No other issues noted.   Patient Active Problem List   Diagnosis Date Noted  . Chest discomfort 08/13/2018  . CHF (congestive heart failure) ([place]) 08/13/2018  . COPD (chronic obstructive pulmonary disease) ([place]) 08/13/2018  . Open wound 08/13/2018  . Shortness of breath 08/13/2018  . CAD in native artery 02/18/2018  . Aortic stenosis 02/18/2018  . Essential hypertension 02/18/2018  . Foot swelling 01/26/2018  . Diabetes mellitus, type 2 ([place]) 01/26/2018  . Diabetic peripheral neuropathy ([place]) 02/25/2017  . Hammer toes of both feet 02/25/2017  . Risk for falls 09/23/2016  . NSTEMI (non-ST elevated myocardial infarction) ([place]) 08/30/2016  . Wound eschar of foot 08/21/2016  . Dilated cardiomyopathy ([place]) 07/16/2016  . Dyslipidemia (high LDL; low HDL) 07/16/2016  . Noncompliance 07/16/2016  . Smoking 07/16/2016  . Stroke ([place]) 12/19/2015    Current Outpatient Medications on File Prior to Visit  Medication Sig Dispense Refill  . aspirin EC 81 MG tablet Take 81 mg by mouth daily.    Marland Kitchen atorvastatin (LIPITOR) 40 MG tablet     . atorvastatin (LIPITOR) 80 MG tablet TAKE 1 TABLET BY MOUTH EVERY EVENING.    Marland Kitchen azithromycin (ZITHROMAX) 250 MG tablet     . carvedilol (COREG) 3.125 MG tablet Take 6.25 mg by mouth every 12 (twelve) hours.    . cefdinir (OMNICEF) 300 MG capsule     . clindamycin (CLEOCIN)  150 MG capsule     . clindamycin (CLEOCIN) 300 MG capsule Take 2 capsules (600mg ) 45 minutes prior to dental procedures 10 capsule 1  . clopidogrel (PLAVIX) 75 MG tablet TAKE 1 TABLET BY MOUTH DAILY.    Marland Kitchen COMBIVENT RESPIMAT 20-100 MCG/ACT AERS respimat     . doxycycline (MONODOX) 100 MG capsule TAKE 1 CAPSULE WITH FOOD TWICE DAILY UNTIL ALL GONE.    Marland Kitchen glipiZIDE (GLUCOTROL) 5 MG tablet     . levofloxacin (LEVAQUIN) 500 MG tablet Take 1 tablet (500 mg total) by mouth daily. 14 tablet 0  . lisinopril (PRINIVIL,ZESTRIL) 5 MG tablet Take 5 mg by mouth daily.    . metFORMIN (GLUCOPHAGE) 1000 MG tablet TAKE ONE TABLET BY MOUTH TWICE DAILY    . nitroGLYCERIN (NITROSTAT) 0.4 MG SL tablet PLACE 1 TABLET (0.4 MG TOTAL) UNDER THE TONGUE EVERY FIVE (5) MINUTES AS NEEDED FOR CHEST PAIN.    Marland Kitchen oxyCODONE (OXY IR/ROXICODONE) 5 MG immediate release tablet     . predniSONE (DELTASONE) 20 MG tablet     . sulfamethoxazole-trimethoprim (BACTRIM) 400-80 MG tablet Take 1 tablet by mouth 2 (two) times daily. 28 tablet 0  . tamsulosin (FLOMAX) 0.4 MG CAPS capsule TAKE 1 CAPSULE DAILY, 30 MINUTES AFTER SUPPER.    Marland Kitchen topiramate (TOPAMAX) 50 MG tablet Take 1 tablet (50 mg total) by mouth daily. 30 tablet 3  . triamcinolone cream (KENALOG) 0.1 % Apply 1 application topically 2 (two) times daily.    Marland Kitchen  ezetimibe (ZETIA) 10 MG tablet Take 1 tablet (10 mg total) by mouth daily. 30 tablet 5   No current facility-administered medications on file prior to visit.     Allergies  Allergen Reactions  . Penicillins Nausea And Vomiting  . Milk-Related Compounds Hives and Itching    Objective: There were no vitals filed for this visit.  General: No acute distress, AAOx3  Right foot: Dorsal incision well-healed at the right fourth toe and distal metatarsal amputation site, the plantar right forefoot ulceration appears to be prematurely healed with no obvious opening however there is an indentation in the skin with no active  drainage no malodor no pus no fluctuance no pain to palpation,mild swelling to right foot, no erythema, no warmth, no malodor, no other signs of infection noted, Capillary fill time <5 seconds in all remaining digits, gross sensation present via light touch to right foot. No pain or crepitation with range of motion right foot.  + planus and hammertoe deformity. S/p R 4th toe amp. No pain with calf compression.   Assessment and Plan:  Problem List Items Addressed This Visit    None    Visit Diagnoses    Diabetic ulcer of other part of right foot associated with diabetes mellitus due to underlying condition, with fat layer exposed ([place])    -  Primary   Prematurely healed   Cellulitis and abscess of foot, except toes       S/P amputation of lesser toe, right ([place])       Post-operative state           -Patient seen and evaluated -Applied dry gauze dressing and paper tape to right foot -Nursing to continue with dressings consistent of the same 2 to 3x per week and if remains healed week to can discontinue home nursing -Advised patient as long as there is no drainage and the wound remains healed on his right foot can slowly transition to using a tennis shoe until he can be fitted and measured for his diabetic shoes with offloading insoles; patient is awaiting diabetic shoe measurements -Advised patient to limit activity to necessity  -Advised patient to ice and elevate as necessary to assist with pain and edema control -Will plan for wound check and shoe measurements with [name] at next office visit. In the meantime, patient to call office if any issues or problems arise.   [name], DPM

## 2018-09-24 NOTE — Telephone Encounter (Signed)
-----   Message from [name], [place] sent at 09/24/2018 10:18 AM EDT ----- Regarding: Nursing orders Wound check  Apply dry guaze and papertape to right foot plantar wound. If there is no drainage by d/c home nursing after 1 week. If there is drainage then continue to use betadine. Patient may slowly start to use a tennis shoe as tolerated. Patient will follow up in office in 2 weeks -Dr. Cannon Kettle

## 2018-09-30 DIAGNOSIS — I2510 Atherosclerotic heart disease of native coronary artery without angina pectoris: Secondary | ICD-10-CM | POA: Diagnosis not present

## 2018-09-30 DIAGNOSIS — E1165 Type 2 diabetes mellitus with hyperglycemia: Secondary | ICD-10-CM | POA: Diagnosis not present

## 2018-09-30 DIAGNOSIS — I110 Hypertensive heart disease with heart failure: Secondary | ICD-10-CM | POA: Diagnosis not present

## 2018-09-30 DIAGNOSIS — Z4781 Encounter for orthopedic aftercare following surgical amputation: Secondary | ICD-10-CM | POA: Diagnosis not present

## 2018-09-30 DIAGNOSIS — I509 Heart failure, unspecified: Secondary | ICD-10-CM | POA: Diagnosis not present

## 2018-09-30 DIAGNOSIS — I69331 Monoplegia of upper limb following cerebral infarction affecting right dominant side: Secondary | ICD-10-CM | POA: Diagnosis not present

## 2018-10-01 ENCOUNTER — Other Ambulatory Visit: Payer: Self-pay

## 2018-10-01 ENCOUNTER — Ambulatory Visit (INDEPENDENT_AMBULATORY_CARE_PROVIDER_SITE_OTHER): Payer: Medicare Other

## 2018-10-01 DIAGNOSIS — I639 Cerebral infarction, unspecified: Secondary | ICD-10-CM

## 2018-10-01 DIAGNOSIS — I350 Nonrheumatic aortic (valve) stenosis: Secondary | ICD-10-CM

## 2018-10-01 NOTE — Progress Notes (Signed)
Complete echocardiogram has been performed.  Jimmy Aiyden Lauderback RDCS, RVT 

## 2018-10-07 ENCOUNTER — Telehealth: Payer: Self-pay | Admitting: *Deleted

## 2018-10-07 DIAGNOSIS — I350 Nonrheumatic aortic (valve) stenosis: Secondary | ICD-10-CM

## 2018-10-07 NOTE — Telephone Encounter (Signed)
-----   Message from Richardo Priest, MD sent at 10/05/2018 10:50 AM EDT ----- His aortic stenosis remains moderate, I would like him to have a repeat echocardiogram in 6 months and see me about 1 to 2 weeks afterwards

## 2018-10-07 NOTE — Telephone Encounter (Signed)
Attempted to contact patient to discuss echocardiogram results with no answer, unable to leave a message. Will continue efforts.

## 2018-10-08 ENCOUNTER — Encounter: Payer: Self-pay | Admitting: Sports Medicine

## 2018-10-08 ENCOUNTER — Ambulatory Visit (INDEPENDENT_AMBULATORY_CARE_PROVIDER_SITE_OTHER): Payer: Medicare Other | Admitting: Sports Medicine

## 2018-10-08 ENCOUNTER — Other Ambulatory Visit: Payer: Self-pay

## 2018-10-08 VITALS — BP 146/77 | HR 65 | Temp 97.2°F | Resp 16

## 2018-10-08 DIAGNOSIS — L97512 Non-pressure chronic ulcer of other part of right foot with fat layer exposed: Secondary | ICD-10-CM

## 2018-10-08 DIAGNOSIS — E08621 Diabetes mellitus due to underlying condition with foot ulcer: Secondary | ICD-10-CM

## 2018-10-08 DIAGNOSIS — Z89421 Acquired absence of other right toe(s): Secondary | ICD-10-CM

## 2018-10-08 DIAGNOSIS — L03119 Cellulitis of unspecified part of limb: Secondary | ICD-10-CM

## 2018-10-08 DIAGNOSIS — L02619 Cutaneous abscess of unspecified foot: Secondary | ICD-10-CM

## 2018-10-08 DIAGNOSIS — Z9889 Other specified postprocedural states: Secondary | ICD-10-CM

## 2018-10-08 NOTE — Progress Notes (Signed)
Subjective: Craig Rollins is a 63 y.o. male patient seen today in office for POV # 6 (DOS 08/06/2018), S/P right fourth toe amputation with plantar wound debridement.  Patient reports that his right foot is healed and he is doing good in a tennis shoe. FBS 323 elevated since on Prednisone for Eczema.  Patient denies nausea vomiting fever chills or any other constitutional symptoms at this time.  No other issues noted.   Patient Active Problem List   Diagnosis Date Noted  . Chest discomfort 08/13/2018  . CHF (congestive heart failure) ([place]) 08/13/2018  . COPD (chronic obstructive pulmonary disease) ([place]) 08/13/2018  . Open wound 08/13/2018  . Shortness of breath 08/13/2018  . CAD in native artery 02/18/2018  . Aortic stenosis 02/18/2018  . Essential hypertension 02/18/2018  . Foot swelling 01/26/2018  . Diabetes mellitus, type 2 ([place]) 01/26/2018  . Diabetic peripheral neuropathy ([place]) 02/25/2017  . Hammer toes of both feet 02/25/2017  . Risk for falls 09/23/2016  . NSTEMI (non-ST elevated myocardial infarction) ([place]) 08/30/2016  . Wound eschar of foot 08/21/2016  . Dilated cardiomyopathy ([place]) 07/16/2016  . Dyslipidemia (high LDL; low HDL) 07/16/2016  . Noncompliance 07/16/2016  . Smoking 07/16/2016  . Stroke ([place]) 12/19/2015    Current Outpatient Medications on File Prior to Visit  Medication Sig Dispense Refill  . aspirin EC 81 MG tablet Take 81 mg by mouth daily.    Marland Kitchen atorvastatin (LIPITOR) 40 MG tablet     . atorvastatin (LIPITOR) 80 MG tablet TAKE 1 TABLET BY MOUTH EVERY EVENING.    Marland Kitchen azithromycin (ZITHROMAX) 250 MG tablet     . carvedilol (COREG) 3.125 MG tablet Take 6.25 mg by mouth every 12 (twelve) hours.    . cefdinir (OMNICEF) 300 MG capsule     . clindamycin (CLEOCIN) 150 MG capsule     . clindamycin (CLEOCIN) 300 MG capsule Take 2 capsules (600mg ) 45 minutes prior to dental procedures 10 capsule 1  . clopidogrel (PLAVIX) 75 MG tablet TAKE 1 TABLET BY MOUTH  DAILY.    Marland Kitchen COMBIVENT RESPIMAT 20-100 MCG/ACT AERS respimat     . doxycycline (MONODOX) 100 MG capsule TAKE 1 CAPSULE WITH FOOD TWICE DAILY UNTIL ALL GONE.    Marland Kitchen glipiZIDE (GLUCOTROL) 5 MG tablet     . levofloxacin (LEVAQUIN) 500 MG tablet Take 1 tablet (500 mg total) by mouth daily. 14 tablet 0  . lisinopril (PRINIVIL,ZESTRIL) 5 MG tablet Take 5 mg by mouth daily.    . metFORMIN (GLUCOPHAGE) 1000 MG tablet TAKE ONE TABLET BY MOUTH TWICE DAILY    . nitroGLYCERIN (NITROSTAT) 0.4 MG SL tablet PLACE 1 TABLET (0.4 MG TOTAL) UNDER THE TONGUE EVERY FIVE (5) MINUTES AS NEEDED FOR CHEST PAIN.    Marland Kitchen oxyCODONE (OXY IR/ROXICODONE) 5 MG immediate release tablet     . predniSONE (DELTASONE) 20 MG tablet     . sulfamethoxazole-trimethoprim (BACTRIM) 400-80 MG tablet Take 1 tablet by mouth 2 (two) times daily. 28 tablet 0  . tamsulosin (FLOMAX) 0.4 MG CAPS capsule TAKE 1 CAPSULE DAILY, 30 MINUTES AFTER SUPPER.    Marland Kitchen topiramate (TOPAMAX) 50 MG tablet Take 1 tablet (50 mg total) by mouth daily. 30 tablet 3  . triamcinolone cream (KENALOG) 0.1 % Apply 1 application topically 2 (two) times daily.    Marland Kitchen ezetimibe (ZETIA) 10 MG tablet Take 1 tablet (10 mg total) by mouth daily. 30 tablet 5   No current facility-administered medications on file prior to visit.  Allergies  Allergen Reactions  . Penicillins Nausea And Vomiting  . Milk-Related Compounds Hives and Itching    Objective: There were no vitals filed for this visit.  General: No acute distress, AAOx3  Right foot: Dorsal incision well-healed at the right fourth toe and distal metatarsal amputation site, the plantar right forefoot remains healed, no active drainage, no malodor, no pain to palpation, no swelling to right foot, no erythema, no warmth, no malodor, no other signs of infection noted, Capillary fill time <5 seconds in all remaining digits, gross sensation present via light touch to right foot. No pain or crepitation with range of motion right  foot.  + planus and hammertoe deformity. S/p R 4th toe amp. No pain with calf compression.   Hx of callus at left 1st toe, currently asymptomatic.   Assessment and Plan:  Problem List Items Addressed This Visit    None    Visit Diagnoses    Diabetic ulcer of other part of right foot associated with diabetes mellitus due to underlying condition, with fat layer exposed ([place])    -  Primary   healed   Cellulitis and abscess of foot, except toes       S/P amputation of lesser toe, right ([place])       Post-operative state           -Patient seen and evaluated -Right foot is healed over; Dressings no longer needed -Safe step diabetic shoe order form was completed; office to contact primary care for approval / certification;  Office to arrange shoe fitting and dispensing. -Will plan for diabetic shoe measurements with [name] at next office visit. In the meantime, patient to call office if any issues or problems arise.   [name], DPM

## 2018-10-13 NOTE — Telephone Encounter (Signed)
Informed patient of echocardiogram results and scheduled another echocardiogram in 6 months on 04/13/2019 at 8:15 am in the [place] office per Dr. Bettina Gavia. Patient will follow up to discuss results in the [place] office on 04/20/2019 at 9:00 am. Patient verbalized understanding and is agreeable to plan. No further questions.

## 2018-10-13 NOTE — Addendum Note (Signed)
Addended by: Austin Miles on: 10/13/2018 08:57 AM   Modules accepted: Orders

## 2018-10-14 DIAGNOSIS — L298 Other pruritus: Secondary | ICD-10-CM | POA: Diagnosis not present

## 2018-10-14 DIAGNOSIS — E1165 Type 2 diabetes mellitus with hyperglycemia: Secondary | ICD-10-CM | POA: Diagnosis not present

## 2018-10-16 DIAGNOSIS — T380X5A Adverse effect of glucocorticoids and synthetic analogues, initial encounter: Secondary | ICD-10-CM | POA: Diagnosis not present

## 2018-10-16 DIAGNOSIS — J449 Chronic obstructive pulmonary disease, unspecified: Secondary | ICD-10-CM | POA: Diagnosis not present

## 2018-10-16 DIAGNOSIS — R079 Chest pain, unspecified: Secondary | ICD-10-CM | POA: Diagnosis not present

## 2018-10-16 DIAGNOSIS — E0965 Drug or chemical induced diabetes mellitus with hyperglycemia: Secondary | ICD-10-CM | POA: Diagnosis not present

## 2018-10-16 DIAGNOSIS — R0789 Other chest pain: Secondary | ICD-10-CM | POA: Diagnosis not present

## 2018-10-16 DIAGNOSIS — E7800 Pure hypercholesterolemia, unspecified: Secondary | ICD-10-CM | POA: Diagnosis not present

## 2018-10-16 DIAGNOSIS — R457 State of emotional shock and stress, unspecified: Secondary | ICD-10-CM | POA: Diagnosis not present

## 2018-10-16 DIAGNOSIS — I252 Old myocardial infarction: Secondary | ICD-10-CM | POA: Diagnosis not present

## 2018-10-16 DIAGNOSIS — R05 Cough: Secondary | ICD-10-CM | POA: Diagnosis not present

## 2018-10-16 DIAGNOSIS — F17210 Nicotine dependence, cigarettes, uncomplicated: Secondary | ICD-10-CM | POA: Diagnosis not present

## 2018-10-16 DIAGNOSIS — E1165 Type 2 diabetes mellitus with hyperglycemia: Secondary | ICD-10-CM | POA: Diagnosis not present

## 2018-10-16 DIAGNOSIS — I509 Heart failure, unspecified: Secondary | ICD-10-CM | POA: Diagnosis not present

## 2018-10-16 DIAGNOSIS — I110 Hypertensive heart disease with heart failure: Secondary | ICD-10-CM | POA: Diagnosis not present

## 2018-10-21 ENCOUNTER — Other Ambulatory Visit: Payer: Medicare Other

## 2018-11-03 DIAGNOSIS — E11621 Type 2 diabetes mellitus with foot ulcer: Secondary | ICD-10-CM | POA: Diagnosis not present

## 2018-11-03 DIAGNOSIS — R21 Rash and other nonspecific skin eruption: Secondary | ICD-10-CM | POA: Diagnosis not present

## 2018-11-03 DIAGNOSIS — L97509 Non-pressure chronic ulcer of other part of unspecified foot with unspecified severity: Secondary | ICD-10-CM | POA: Diagnosis not present

## 2018-11-03 DIAGNOSIS — E785 Hyperlipidemia, unspecified: Secondary | ICD-10-CM | POA: Diagnosis not present

## 2018-11-06 DIAGNOSIS — L300 Nummular dermatitis: Secondary | ICD-10-CM | POA: Diagnosis not present

## 2018-11-06 DIAGNOSIS — L578 Other skin changes due to chronic exposure to nonionizing radiation: Secondary | ICD-10-CM | POA: Diagnosis not present

## 2018-11-16 DIAGNOSIS — L300 Nummular dermatitis: Secondary | ICD-10-CM | POA: Diagnosis not present

## 2018-11-18 ENCOUNTER — Other Ambulatory Visit: Payer: Self-pay

## 2018-11-18 ENCOUNTER — Ambulatory Visit (INDEPENDENT_AMBULATORY_CARE_PROVIDER_SITE_OTHER): Payer: Medicare Other

## 2018-11-18 ENCOUNTER — Other Ambulatory Visit: Payer: Self-pay | Admitting: Sports Medicine

## 2018-11-18 ENCOUNTER — Encounter: Payer: Self-pay | Admitting: Sports Medicine

## 2018-11-18 ENCOUNTER — Ambulatory Visit (INDEPENDENT_AMBULATORY_CARE_PROVIDER_SITE_OTHER): Payer: Medicare Other | Admitting: Sports Medicine

## 2018-11-18 VITALS — Temp 97.5°F | Resp 16

## 2018-11-18 DIAGNOSIS — M79671 Pain in right foot: Secondary | ICD-10-CM

## 2018-11-18 DIAGNOSIS — E1142 Type 2 diabetes mellitus with diabetic polyneuropathy: Secondary | ICD-10-CM | POA: Diagnosis not present

## 2018-11-18 DIAGNOSIS — I639 Cerebral infarction, unspecified: Secondary | ICD-10-CM | POA: Diagnosis not present

## 2018-11-18 DIAGNOSIS — Z89421 Acquired absence of other right toe(s): Secondary | ICD-10-CM

## 2018-11-18 DIAGNOSIS — S96912A Strain of unspecified muscle and tendon at ankle and foot level, left foot, initial encounter: Secondary | ICD-10-CM

## 2018-11-18 DIAGNOSIS — M25572 Pain in left ankle and joints of left foot: Secondary | ICD-10-CM

## 2018-11-18 DIAGNOSIS — S96911A Strain of unspecified muscle and tendon at ankle and foot level, right foot, initial encounter: Secondary | ICD-10-CM

## 2018-11-18 DIAGNOSIS — S99912A Unspecified injury of left ankle, initial encounter: Secondary | ICD-10-CM | POA: Diagnosis not present

## 2018-11-18 NOTE — Progress Notes (Signed)
Subjective:  Craig Rollins is a 63 y.o. male patient who presents to office for evaluation of left ankle pain. Patient complains of continued pain in the ankle since Sunday reports that he was sleep sitting reclining and watching TV and all of a sudden felt a pop afterwards experienced a lot of pain difficult to walk and stand and reports that his foot drop is down now after hearing this pop that caused immediate pain 4 out of 10 that radiates up the leg that is worse with movement.  Patient denies any changes with medication but does report that he has been on antibiotics over the last 3 weeks for a skin condition and has been using a topical steroid but cannot remember the name of these medications at this time. Patient has tried no treatment.  Patient denies any other pedal complaints. Denies injury/trip/fall/sprain/any causative factors.  Admits that his blood sugar this morning was 200 and that his right foot is doing great.  Patient Active Problem List   Diagnosis Date Noted  . Chest discomfort 08/13/2018  . CHF (congestive heart failure) ([place]) 08/13/2018  . COPD (chronic obstructive pulmonary disease) ([place]) 08/13/2018  . Open wound 08/13/2018  . Shortness of breath 08/13/2018  . CAD in native artery 02/18/2018  . Aortic stenosis 02/18/2018  . Essential hypertension 02/18/2018  . Foot swelling 01/26/2018  . Diabetes mellitus, type 2 ([place]) 01/26/2018  . Diabetic peripheral neuropathy ([place]) 02/25/2017  . Hammer toes of both feet 02/25/2017  . Risk for falls 09/23/2016  . NSTEMI (non-ST elevated myocardial infarction) ([place]) 08/30/2016  . Wound eschar of foot 08/21/2016  . Dilated cardiomyopathy ([place]) 07/16/2016  . Dyslipidemia (high LDL; low HDL) 07/16/2016  . Noncompliance 07/16/2016  . Smoking 07/16/2016  . Stroke ([place]) 12/19/2015    Current Outpatient Medications on File Prior to Visit  Medication Sig Dispense Refill  . aspirin EC 81 MG tablet Take 81 mg by mouth daily.     Marland Kitchen atorvastatin (LIPITOR) 40 MG tablet     . atorvastatin (LIPITOR) 80 MG tablet TAKE 1 TABLET BY MOUTH EVERY EVENING.    Marland Kitchen azithromycin (ZITHROMAX) 250 MG tablet     . carvedilol (COREG) 3.125 MG tablet Take 6.25 mg by mouth every 12 (twelve) hours.    . cefdinir (OMNICEF) 300 MG capsule     . clindamycin (CLEOCIN) 150 MG capsule     . clindamycin (CLEOCIN) 300 MG capsule Take 2 capsules (600mg ) 45 minutes prior to dental procedures 10 capsule 1  . clopidogrel (PLAVIX) 75 MG tablet TAKE 1 TABLET BY MOUTH DAILY.    Marland Kitchen COMBIVENT RESPIMAT 20-100 MCG/ACT AERS respimat     . doxycycline (MONODOX) 100 MG capsule TAKE 1 CAPSULE WITH FOOD TWICE DAILY UNTIL ALL GONE.    . fluocinonide ointment (LIDEX) 0.05 %     . glipiZIDE (GLUCOTROL) 5 MG tablet     . hydrOXYzine (ATARAX/VISTARIL) 25 MG tablet     . levofloxacin (LEVAQUIN) 500 MG tablet Take 1 tablet (500 mg total) by mouth daily. 14 tablet 0  . lisinopril (PRINIVIL,ZESTRIL) 5 MG tablet Take 5 mg by mouth daily.    . metFORMIN (GLUCOPHAGE) 1000 MG tablet TAKE ONE TABLET BY MOUTH TWICE DAILY    . metFORMIN (GLUCOPHAGE) 500 MG tablet TAKE 1 TABLET BY MOUTH EVERY TWELVE HOURS    . nitroGLYCERIN (NITROSTAT) 0.4 MG SL tablet PLACE 1 TABLET (0.4 MG TOTAL) UNDER THE TONGUE EVERY FIVE (5) MINUTES AS NEEDED FOR CHEST PAIN.    Marland Kitchen  oxyCODONE (OXY IR/ROXICODONE) 5 MG immediate release tablet     . predniSONE (DELTASONE) 20 MG tablet     . sulfamethoxazole-trimethoprim (BACTRIM) 400-80 MG tablet Take 1 tablet by mouth 2 (two) times daily. 28 tablet 0  . tamsulosin (FLOMAX) 0.4 MG CAPS capsule TAKE 1 CAPSULE DAILY, 30 MINUTES AFTER SUPPER.    Marland Kitchen topiramate (TOPAMAX) 50 MG tablet Take 1 tablet (50 mg total) by mouth daily. 30 tablet 3  . triamcinolone cream (KENALOG) 0.1 % Apply 1 application topically 2 (two) times daily.    Marland Kitchen ezetimibe (ZETIA) 10 MG tablet Take 1 tablet (10 mg total) by mouth daily. 30 tablet 5   No current facility-administered medications on  file prior to visit.     Allergies  Allergen Reactions  . Penicillins Nausea And Vomiting  . Milk-Related Compounds Hives and Itching    Objective:  General: Alert and oriented x3 in no acute distress  Dermatology: No open lesions bilateral lower extremities, nails are short and thickened.  Vascular: Dorsalis Pedis and Posterior Tibial pedal pulses palpable, Capillary Fill Time 5 seconds, no pedal hair growth bilateral, focal edema left ankle, Temperature gradient within normal limits.  Neurology: Protective sensation absent bilateral  Musculoskeletal: Mild to moderate tenderness with palpation over the extensor tendon there is a suspected gap over the tibialis anterior tendon however most of his pain is lateral to this on the left ankle.  Upon ambulation there is mild foot drop noted.  Status post right fourth toe amputation that remains well-healed.  Gait: Antalgic gait  Xrays  Left ankle   Impression: No fracture or dislocation there is diffuse soft tissue swelling and underlying diffuse arthritis otherwise no other acute findings.  Assessment and Plan: Problem List Items Addressed This Visit      Nervous and Auditory   Diabetic peripheral neuropathy (HCC)   Relevant Medications   hydrOXYzine (ATARAX/VISTARIL) 25 MG tablet   metFORMIN (GLUCOPHAGE) 500 MG tablet    Other Visit Diagnoses    Injury, ankle, left, initial encounter    -  Primary   Tendon tear, ankle, left, initial encounter       Pain in joint involving ankle and foot, left       S/P amputation of lesser toe, right (HCC)           -Complete examination performed -Xrays reviewed -Discussed treatement options for likely tendon tear and spontaneous injury to ankle on left -Rx MRI for further evaluation for tear -Applied Unna boot for patient to wear to keep intact for 1 week advised patient if he goes for his MRI before next week to remove for MRI and replaced with a compression sleeve as provided at today's  visit -Dispensed cam boot to protect left foot and ankle until his MRI can give [place] for the results of what is going on with his tendons -Recommend rest ice elevation daily until symptoms improve -Patient to return to office next week or sooner if condition worsens.  [name], DPM

## 2018-11-24 ENCOUNTER — Other Ambulatory Visit: Payer: Self-pay | Admitting: Sports Medicine

## 2018-11-24 DIAGNOSIS — S96911A Strain of unspecified muscle and tendon at ankle and foot level, right foot, initial encounter: Secondary | ICD-10-CM

## 2018-11-24 DIAGNOSIS — M79671 Pain in right foot: Secondary | ICD-10-CM

## 2018-11-25 ENCOUNTER — Encounter: Payer: Self-pay | Admitting: Sports Medicine

## 2018-11-25 ENCOUNTER — Ambulatory Visit (INDEPENDENT_AMBULATORY_CARE_PROVIDER_SITE_OTHER): Payer: Medicare Other | Admitting: Sports Medicine

## 2018-11-25 ENCOUNTER — Other Ambulatory Visit: Payer: Self-pay

## 2018-11-25 VITALS — Temp 96.9°F | Resp 16

## 2018-11-25 DIAGNOSIS — E1142 Type 2 diabetes mellitus with diabetic polyneuropathy: Secondary | ICD-10-CM

## 2018-11-25 DIAGNOSIS — I639 Cerebral infarction, unspecified: Secondary | ICD-10-CM

## 2018-11-25 DIAGNOSIS — S96912D Strain of unspecified muscle and tendon at ankle and foot level, left foot, subsequent encounter: Secondary | ICD-10-CM | POA: Diagnosis not present

## 2018-11-25 DIAGNOSIS — S99912D Unspecified injury of left ankle, subsequent encounter: Secondary | ICD-10-CM

## 2018-11-25 DIAGNOSIS — M25572 Pain in left ankle and joints of left foot: Secondary | ICD-10-CM | POA: Diagnosis not present

## 2018-11-25 NOTE — Progress Notes (Signed)
Subjective:  Craig Rollins is a 63 y.o. male patient who returns to office for evaluation of left ankle pain. Patient reports that ankle pain is the same, 6/10 sharp with stiffness. The soft cast helped with swelling but not pain. Does notice after he takes the sleeve off he has swelling. Reports that he got the unna boot wet on Monday and had to remove it.  Patient denies any other pedal complaints.   FBS this AM 122 Patient Active Problem List   Diagnosis Date Noted  . Chest discomfort 08/13/2018  . CHF (congestive heart failure) ([place]) 08/13/2018  . COPD (chronic obstructive pulmonary disease) ([place]) 08/13/2018  . Open wound 08/13/2018  . Shortness of breath 08/13/2018  . CAD in native artery 02/18/2018  . Aortic stenosis 02/18/2018  . Essential hypertension 02/18/2018  . Foot swelling 01/26/2018  . Diabetes mellitus, type 2 ([place]) 01/26/2018  . Diabetic peripheral neuropathy ([place]) 02/25/2017  . Hammer toes of both feet 02/25/2017  . Risk for falls 09/23/2016  . NSTEMI (non-ST elevated myocardial infarction) ([place]) 08/30/2016  . Wound eschar of foot 08/21/2016  . Dilated cardiomyopathy ([place]) 07/16/2016  . Dyslipidemia (high LDL; low HDL) 07/16/2016  . Noncompliance 07/16/2016  . Smoking 07/16/2016  . Stroke ([place]) 12/19/2015    Current Outpatient Medications on File Prior to Visit  Medication Sig Dispense Refill  . 24HR ALLERGY RELIEF 180 MG tablet Take 180 mg by mouth 2 (two) times daily.    Marland Kitchen aspirin EC 81 MG tablet Take 81 mg by mouth daily.    Marland Kitchen atorvastatin (LIPITOR) 40 MG tablet     . atorvastatin (LIPITOR) 80 MG tablet TAKE 1 TABLET BY MOUTH EVERY EVENING.    Marland Kitchen azithromycin (ZITHROMAX) 250 MG tablet     . carvedilol (COREG) 3.125 MG tablet Take 6.25 mg by mouth every 12 (twelve) hours.    . cefdinir (OMNICEF) 300 MG capsule     . clindamycin (CLEOCIN) 150 MG capsule     . clindamycin (CLEOCIN) 300 MG capsule Take 2 capsules (600mg ) 45 minutes prior to dental  procedures 10 capsule 1  . clopidogrel (PLAVIX) 75 MG tablet TAKE 1 TABLET BY MOUTH DAILY.    Marland Kitchen COMBIVENT RESPIMAT 20-100 MCG/ACT AERS respimat     . doxycycline (MONODOX) 100 MG capsule TAKE 1 CAPSULE WITH FOOD TWICE DAILY UNTIL ALL GONE.    . fluocinonide ointment (LIDEX) 0.05 %     . glipiZIDE (GLUCOTROL) 5 MG tablet     . hydrOXYzine (ATARAX/VISTARIL) 25 MG tablet     . levofloxacin (LEVAQUIN) 500 MG tablet Take 1 tablet (500 mg total) by mouth daily. 14 tablet 0  . lisinopril (PRINIVIL,ZESTRIL) 5 MG tablet Take 5 mg by mouth daily.    . metFORMIN (GLUCOPHAGE) 1000 MG tablet TAKE ONE TABLET BY MOUTH TWICE DAILY    . metFORMIN (GLUCOPHAGE) 500 MG tablet TAKE 1 TABLET BY MOUTH EVERY TWELVE HOURS    . nitroGLYCERIN (NITROSTAT) 0.4 MG SL tablet PLACE 1 TABLET (0.4 MG TOTAL) UNDER THE TONGUE EVERY FIVE (5) MINUTES AS NEEDED FOR CHEST PAIN.    Marland Kitchen oxyCODONE (OXY IR/ROXICODONE) 5 MG immediate release tablet     . predniSONE (DELTASONE) 20 MG tablet     . sulfamethoxazole-trimethoprim (BACTRIM) 400-80 MG tablet Take 1 tablet by mouth 2 (two) times daily. 28 tablet 0  . tamsulosin (FLOMAX) 0.4 MG CAPS capsule TAKE 1 CAPSULE DAILY, 30 MINUTES AFTER SUPPER.    Marland Kitchen topiramate (TOPAMAX) 50 MG tablet Take  1 tablet (50 mg total) by mouth daily. 30 tablet 3  . triamcinolone cream (KENALOG) 0.1 % Apply 1 application topically 2 (two) times daily.    Marland Kitchen ezetimibe (ZETIA) 10 MG tablet Take 1 tablet (10 mg total) by mouth daily. 30 tablet 5   No current facility-administered medications on file prior to visit.     Allergies  Allergen Reactions  . Penicillins Nausea And Vomiting  . Milk-Related Compounds Hives and Itching    Objective:  General: Alert and oriented x3 in no acute distress  Dermatology: No open lesions bilateral lower extremities, nails are short and thickened.  Vascular: Dorsalis Pedis and Posterior Tibial pedal pulses palpable, Capillary Fill Time 5 seconds, no pedal hair growth  bilateral, focal edema left ankle, Temperature gradient within normal limits.  Neurology: Protective sensation absent bilateral  Musculoskeletal: Mild to moderate tenderness with palpation over the extensor tendon there is a suspected gap over the tibialis anterior tendon however most of his pain is lateral to this on the left ankle with limited ROM at ankle.  Upon ambulation there is mild foot drop noted that is supported with CAM boot.  Status post right fourth toe amputation that remains well-healed.   Assessment and Plan: Problem List Items Addressed This Visit      Nervous and Auditory   Diabetic peripheral neuropathy ([place])    Other Visit Diagnoses    Injury of ankle, left, subsequent encounter    -  Primary   Strain of ankle, left, subsequent encounter       Pain in joint involving ankle and foot, left          -Complete examination performed -Xrays reviewed -Discussed treatement options for likely tendon tear and spontaneous injury to ankle on left with stiffness -Awaiting MRI for further evaluation for tear -Applied compression sleeve -Continue with Cam boot to protect left foot and ankle -Recommend rest ice elevation daily until symptoms improve -Patient to return to office next week for MRI results or sooner if condition worsens.  [name], DPM

## 2018-11-25 NOTE — Addendum Note (Signed)
Addended by: Johnnye Lana A on: 11/25/2018 11:04 AM   Modules accepted: Orders

## 2018-11-30 DIAGNOSIS — S99912D Unspecified injury of left ankle, subsequent encounter: Secondary | ICD-10-CM | POA: Diagnosis not present

## 2018-11-30 DIAGNOSIS — X58XXXD Exposure to other specified factors, subsequent encounter: Secondary | ICD-10-CM | POA: Diagnosis not present

## 2018-11-30 DIAGNOSIS — S99912A Unspecified injury of left ankle, initial encounter: Secondary | ICD-10-CM | POA: Diagnosis not present

## 2018-11-30 DIAGNOSIS — M7989 Other specified soft tissue disorders: Secondary | ICD-10-CM | POA: Diagnosis not present

## 2018-11-30 DIAGNOSIS — M25572 Pain in left ankle and joints of left foot: Secondary | ICD-10-CM | POA: Diagnosis not present

## 2018-11-30 DIAGNOSIS — S96912D Strain of unspecified muscle and tendon at ankle and foot level, left foot, subsequent encounter: Secondary | ICD-10-CM | POA: Diagnosis not present

## 2018-12-02 ENCOUNTER — Other Ambulatory Visit: Payer: Self-pay

## 2018-12-02 ENCOUNTER — Encounter: Payer: Self-pay | Admitting: Sports Medicine

## 2018-12-02 ENCOUNTER — Ambulatory Visit (INDEPENDENT_AMBULATORY_CARE_PROVIDER_SITE_OTHER): Payer: Medicare Other | Admitting: Sports Medicine

## 2018-12-02 VITALS — Temp 97.0°F | Resp 16

## 2018-12-02 DIAGNOSIS — M25572 Pain in left ankle and joints of left foot: Secondary | ICD-10-CM | POA: Diagnosis not present

## 2018-12-02 DIAGNOSIS — S96912D Strain of unspecified muscle and tendon at ankle and foot level, left foot, subsequent encounter: Secondary | ICD-10-CM | POA: Diagnosis not present

## 2018-12-02 DIAGNOSIS — I639 Cerebral infarction, unspecified: Secondary | ICD-10-CM

## 2018-12-02 DIAGNOSIS — Z89421 Acquired absence of other right toe(s): Secondary | ICD-10-CM | POA: Diagnosis not present

## 2018-12-02 DIAGNOSIS — E1142 Type 2 diabetes mellitus with diabetic polyneuropathy: Secondary | ICD-10-CM

## 2018-12-02 DIAGNOSIS — S99912D Unspecified injury of left ankle, subsequent encounter: Secondary | ICD-10-CM | POA: Diagnosis not present

## 2018-12-02 NOTE — Progress Notes (Signed)
Subjective:  Craig Rollins is a 63 y.o. male patient who returns to office for evaluation of left ankle pain. Patient reports that ankle pain is the same, 7/10 sharp with stiffness.  Patient is also here for review of MRI results.  Patient denies any other pedal complaints.   FBS this AM 125 Patient Active Problem List   Diagnosis Date Noted  . Chest discomfort 08/13/2018  . CHF (congestive heart failure) ([place]) 08/13/2018  . COPD (chronic obstructive pulmonary disease) ([place]) 08/13/2018  . Open wound 08/13/2018  . Shortness of breath 08/13/2018  . CAD in native artery 02/18/2018  . Aortic stenosis 02/18/2018  . Essential hypertension 02/18/2018  . Foot swelling 01/26/2018  . Diabetes mellitus, type 2 ([place]) 01/26/2018  . Diabetic peripheral neuropathy ([place]) 02/25/2017  . Hammer toes of both feet 02/25/2017  . Risk for falls 09/23/2016  . NSTEMI (non-ST elevated myocardial infarction) ([place]) 08/30/2016  . Wound eschar of foot 08/21/2016  . Dilated cardiomyopathy ([place]) 07/16/2016  . Dyslipidemia (high LDL; low HDL) 07/16/2016  . Noncompliance 07/16/2016  . Smoking 07/16/2016  . Stroke ([place]) 12/19/2015    Current Outpatient Medications on File Prior to Visit  Medication Sig Dispense Refill  . 24HR ALLERGY RELIEF 180 MG tablet Take 180 mg by mouth 2 (two) times daily.    Marland Kitchen aspirin EC 81 MG tablet Take 81 mg by mouth daily.    Marland Kitchen atorvastatin (LIPITOR) 40 MG tablet     . atorvastatin (LIPITOR) 80 MG tablet TAKE 1 TABLET BY MOUTH EVERY EVENING.    Marland Kitchen azithromycin (ZITHROMAX) 250 MG tablet     . carvedilol (COREG) 3.125 MG tablet Take 6.25 mg by mouth every 12 (twelve) hours.    . cefdinir (OMNICEF) 300 MG capsule     . clindamycin (CLEOCIN) 150 MG capsule     . clindamycin (CLEOCIN) 300 MG capsule Take 2 capsules ('600mg'$ ) 45 minutes prior to dental procedures 10 capsule 1  . clopidogrel (PLAVIX) 75 MG tablet TAKE 1 TABLET BY MOUTH DAILY.    Marland Kitchen COMBIVENT RESPIMAT 20-100 MCG/ACT AERS  respimat     . doxycycline (MONODOX) 100 MG capsule TAKE 1 CAPSULE WITH FOOD TWICE DAILY UNTIL ALL GONE.    . fluocinonide ointment (LIDEX) 0.05 %     . glipiZIDE (GLUCOTROL) 5 MG tablet     . hydrOXYzine (ATARAX/VISTARIL) 25 MG tablet     . levofloxacin (LEVAQUIN) 500 MG tablet Take 1 tablet (500 mg total) by mouth daily. 14 tablet 0  . lisinopril (PRINIVIL,ZESTRIL) 5 MG tablet Take 5 mg by mouth daily.    . metFORMIN (GLUCOPHAGE) 1000 MG tablet TAKE ONE TABLET BY MOUTH TWICE DAILY    . metFORMIN (GLUCOPHAGE) 500 MG tablet TAKE 1 TABLET BY MOUTH EVERY TWELVE HOURS    . nitroGLYCERIN (NITROSTAT) 0.4 MG SL tablet PLACE 1 TABLET (0.4 MG TOTAL) UNDER THE TONGUE EVERY FIVE (5) MINUTES AS NEEDED FOR CHEST PAIN.    Marland Kitchen oxyCODONE (OXY IR/ROXICODONE) 5 MG immediate release tablet     . predniSONE (DELTASONE) 20 MG tablet     . sulfamethoxazole-trimethoprim (BACTRIM) 400-80 MG tablet Take 1 tablet by mouth 2 (two) times daily. 28 tablet 0  . tamsulosin (FLOMAX) 0.4 MG CAPS capsule TAKE 1 CAPSULE DAILY, 30 MINUTES AFTER SUPPER.    Marland Kitchen topiramate (TOPAMAX) 50 MG tablet Take 1 tablet (50 mg total) by mouth daily. 30 tablet 3  . triamcinolone cream (KENALOG) 0.1 % Apply 1 application topically 2 (two) times  daily.    . ezetimibe (ZETIA) 10 MG tablet Take 1 tablet (10 mg total) by mouth daily. 30 tablet 5   No current facility-administered medications on file prior to visit.     Allergies  Allergen Reactions  . Penicillins Nausea And Vomiting  . Milk-Related Compounds Hives and Itching    Objective:  General: Alert and oriented x3 in no acute distress  Dermatology: No open lesions bilateral lower extremities, nails are short and thickened.  Vascular: Dorsalis Pedis and Posterior Tibial pedal pulses palpable, Capillary Fill Time 5 seconds, no pedal hair growth bilateral, focal edema left ankle, Temperature gradient within normal limits.  Neurology: Protective sensation absent  bilateral  Musculoskeletal: Mild to moderate tenderness with palpation over the extensor tendon there is a suspected gap over the tibialis anterior tendon however most of his pain is lateral to this on the left ankle with limited ROM at ankle like before.  Upon ambulation there is mild foot drop noted that is supported with CAM boot strength 3-1/2 to 4 out of 5.  Status post right fourth toe amputation that remains well-healed.   Assessment and Plan: Problem List Items Addressed This Visit      Nervous and Auditory   Diabetic peripheral neuropathy ([place])    Other Visit Diagnoses    Tendon tear, ankle, left, subsequent encounter    -  Primary   Injury of ankle, left, subsequent encounter       Strain of ankle, left, subsequent encounter       Pain in joint involving ankle and foot, left       S/P amputation of lesser toe, right (HCC)          -Complete examination performed -MRI results reviewed -Discussed treatement options for tibialis anterior tendon tear and spontaneous injury to ankle on left with stiffness -Patient met with Craig Rollins who scanned him for an articulated [place] AFS with dorsi flexion assistance for his left foot and ankle -Advised patient to continue with cam boot and cane for now to protect left foot and ankle until his brace arrives -Recommend rest ice elevation daily until symptoms improve -Patient to return to office for recheck to pick up brace or sooner if condition worsens.  After brace will consider molding patient for custom diabetic shoes due to his history of tendon tear on left and amputation on right.  Craig Rollins, DPM

## 2018-12-07 DIAGNOSIS — E1165 Type 2 diabetes mellitus with hyperglycemia: Secondary | ICD-10-CM | POA: Diagnosis not present

## 2018-12-24 DIAGNOSIS — R972 Elevated prostate specific antigen [PSA]: Secondary | ICD-10-CM | POA: Diagnosis not present

## 2018-12-24 DIAGNOSIS — N401 Enlarged prostate with lower urinary tract symptoms: Secondary | ICD-10-CM | POA: Diagnosis not present

## 2018-12-30 ENCOUNTER — Other Ambulatory Visit: Payer: Medicare Other | Admitting: Orthotics

## 2018-12-30 ENCOUNTER — Other Ambulatory Visit: Payer: Self-pay

## 2018-12-30 DIAGNOSIS — M25572 Pain in left ankle and joints of left foot: Secondary | ICD-10-CM | POA: Diagnosis not present

## 2018-12-30 DIAGNOSIS — E1142 Type 2 diabetes mellitus with diabetic polyneuropathy: Secondary | ICD-10-CM | POA: Diagnosis not present

## 2018-12-30 DIAGNOSIS — S96912A Strain of unspecified muscle and tendon at ankle and foot level, left foot, initial encounter: Secondary | ICD-10-CM

## 2019-01-19 DIAGNOSIS — B351 Tinea unguium: Secondary | ICD-10-CM | POA: Diagnosis not present

## 2019-01-19 DIAGNOSIS — E785 Hyperlipidemia, unspecified: Secondary | ICD-10-CM | POA: Diagnosis not present

## 2019-01-19 DIAGNOSIS — Z1331 Encounter for screening for depression: Secondary | ICD-10-CM | POA: Diagnosis not present

## 2019-01-19 DIAGNOSIS — Z125 Encounter for screening for malignant neoplasm of prostate: Secondary | ICD-10-CM | POA: Diagnosis not present

## 2019-01-19 DIAGNOSIS — Z1211 Encounter for screening for malignant neoplasm of colon: Secondary | ICD-10-CM | POA: Diagnosis not present

## 2019-01-19 DIAGNOSIS — L97921 Non-pressure chronic ulcer of unspecified part of left lower leg limited to breakdown of skin: Secondary | ICD-10-CM | POA: Diagnosis not present

## 2019-01-19 DIAGNOSIS — Z9181 History of falling: Secondary | ICD-10-CM | POA: Diagnosis not present

## 2019-01-19 DIAGNOSIS — Z0000 Encounter for general adult medical examination without abnormal findings: Secondary | ICD-10-CM | POA: Diagnosis not present

## 2019-01-19 DIAGNOSIS — L97911 Non-pressure chronic ulcer of unspecified part of right lower leg limited to breakdown of skin: Secondary | ICD-10-CM | POA: Diagnosis not present

## 2019-01-20 DIAGNOSIS — B351 Tinea unguium: Secondary | ICD-10-CM | POA: Diagnosis not present

## 2019-01-26 DIAGNOSIS — I8310 Varicose veins of unspecified lower extremity with inflammation: Secondary | ICD-10-CM | POA: Diagnosis not present

## 2019-01-26 DIAGNOSIS — B351 Tinea unguium: Secondary | ICD-10-CM | POA: Diagnosis not present

## 2019-01-27 DIAGNOSIS — Z1212 Encounter for screening for malignant neoplasm of rectum: Secondary | ICD-10-CM | POA: Diagnosis not present

## 2019-01-27 DIAGNOSIS — Z1211 Encounter for screening for malignant neoplasm of colon: Secondary | ICD-10-CM | POA: Diagnosis not present

## 2019-02-04 DIAGNOSIS — I8310 Varicose veins of unspecified lower extremity with inflammation: Secondary | ICD-10-CM | POA: Diagnosis not present

## 2019-02-04 DIAGNOSIS — L300 Nummular dermatitis: Secondary | ICD-10-CM | POA: Diagnosis not present

## 2019-02-11 DIAGNOSIS — E785 Hyperlipidemia, unspecified: Secondary | ICD-10-CM | POA: Diagnosis not present

## 2019-02-11 DIAGNOSIS — Z8673 Personal history of transient ischemic attack (TIA), and cerebral infarction without residual deficits: Secondary | ICD-10-CM | POA: Diagnosis not present

## 2019-02-11 DIAGNOSIS — L97509 Non-pressure chronic ulcer of other part of unspecified foot with unspecified severity: Secondary | ICD-10-CM | POA: Diagnosis not present

## 2019-02-11 DIAGNOSIS — E11621 Type 2 diabetes mellitus with foot ulcer: Secondary | ICD-10-CM | POA: Diagnosis not present

## 2019-02-23 DIAGNOSIS — R600 Localized edema: Secondary | ICD-10-CM | POA: Diagnosis not present

## 2019-02-23 DIAGNOSIS — L600 Ingrowing nail: Secondary | ICD-10-CM | POA: Diagnosis not present

## 2019-02-23 DIAGNOSIS — D72829 Elevated white blood cell count, unspecified: Secondary | ICD-10-CM | POA: Diagnosis not present

## 2019-02-26 DIAGNOSIS — I110 Hypertensive heart disease with heart failure: Secondary | ICD-10-CM | POA: Diagnosis not present

## 2019-02-26 DIAGNOSIS — L03119 Cellulitis of unspecified part of limb: Secondary | ICD-10-CM | POA: Diagnosis not present

## 2019-02-26 DIAGNOSIS — R600 Localized edema: Secondary | ICD-10-CM | POA: Diagnosis not present

## 2019-02-26 DIAGNOSIS — L03116 Cellulitis of left lower limb: Secondary | ICD-10-CM | POA: Diagnosis not present

## 2019-02-26 DIAGNOSIS — L309 Dermatitis, unspecified: Secondary | ICD-10-CM | POA: Diagnosis not present

## 2019-02-26 DIAGNOSIS — F17210 Nicotine dependence, cigarettes, uncomplicated: Secondary | ICD-10-CM | POA: Diagnosis not present

## 2019-02-26 DIAGNOSIS — I252 Old myocardial infarction: Secondary | ICD-10-CM | POA: Diagnosis not present

## 2019-02-26 DIAGNOSIS — I509 Heart failure, unspecified: Secondary | ICD-10-CM | POA: Diagnosis not present

## 2019-02-26 DIAGNOSIS — L03115 Cellulitis of right lower limb: Secondary | ICD-10-CM | POA: Diagnosis not present

## 2019-03-16 DIAGNOSIS — L501 Idiopathic urticaria: Secondary | ICD-10-CM | POA: Diagnosis not present

## 2019-03-16 DIAGNOSIS — I8310 Varicose veins of unspecified lower extremity with inflammation: Secondary | ICD-10-CM | POA: Diagnosis not present

## 2019-03-19 ENCOUNTER — Other Ambulatory Visit: Payer: Self-pay

## 2019-03-19 ENCOUNTER — Encounter: Payer: Self-pay | Admitting: Sports Medicine

## 2019-03-19 ENCOUNTER — Other Ambulatory Visit: Payer: Self-pay | Admitting: Sports Medicine

## 2019-03-19 ENCOUNTER — Ambulatory Visit (INDEPENDENT_AMBULATORY_CARE_PROVIDER_SITE_OTHER): Payer: Medicare Other

## 2019-03-19 ENCOUNTER — Ambulatory Visit (INDEPENDENT_AMBULATORY_CARE_PROVIDER_SITE_OTHER): Payer: Medicare Other | Admitting: Sports Medicine

## 2019-03-19 DIAGNOSIS — M79675 Pain in left toe(s): Secondary | ICD-10-CM

## 2019-03-19 DIAGNOSIS — M79672 Pain in left foot: Secondary | ICD-10-CM

## 2019-03-19 DIAGNOSIS — L97521 Non-pressure chronic ulcer of other part of left foot limited to breakdown of skin: Secondary | ICD-10-CM | POA: Diagnosis not present

## 2019-03-19 DIAGNOSIS — E1142 Type 2 diabetes mellitus with diabetic polyneuropathy: Secondary | ICD-10-CM | POA: Diagnosis not present

## 2019-03-19 DIAGNOSIS — I639 Cerebral infarction, unspecified: Secondary | ICD-10-CM

## 2019-03-19 MED ORDER — CLINDAMYCIN HCL 300 MG PO CAPS: 300.0000 mg | ORAL_CAPSULE | Freq: Three times a day (TID) | ORAL | 0 refills | Status: DC

## 2019-03-19 NOTE — Progress Notes (Signed)
Subjective: Craig Rollins is a 63 y.o. male patient seen in office for evaluation of ulceration of the left first and third toes. Patient has a history of diabetes and a blood glucose level  today of not recorded.  Patient reports that he is currently displaced from his home and is living out of his car for the last 30 days.   Patient reports that he has been going to the dermatologist for them to wrap his left leg but over the last week has noticed that his toes have been draining more and the tip of the left third toe has a dark area and has become more swollen with some burning pain reports that he had one episode of nausea but denies vomiting fever night sweats shortness of breath or chills.  Patient has no other pedal complaints at this time.  Patient Active Problem List   Diagnosis Date Noted  . Chest discomfort 08/13/2018  . CHF (congestive heart failure) ([place]) 08/13/2018  . COPD (chronic obstructive pulmonary disease) ([place]) 08/13/2018  . Open wound 08/13/2018  . Shortness of breath 08/13/2018  . CAD in native artery 02/18/2018  . Aortic stenosis 02/18/2018  . Essential hypertension 02/18/2018  . Foot swelling 01/26/2018  . Diabetes mellitus, type 2 ([place]) 01/26/2018  . Diabetic peripheral neuropathy ([place]) 02/25/2017  . Hammer toes of both feet 02/25/2017  . Risk for falls 09/23/2016  . NSTEMI (non-ST elevated myocardial infarction) ([place]) 08/30/2016  . Wound eschar of foot 08/21/2016  . Dilated cardiomyopathy ([place]) 07/16/2016  . Dyslipidemia (high LDL; low HDL) 07/16/2016  . Noncompliance 07/16/2016  . Smoking 07/16/2016  . Stroke ([place]) 12/19/2015   Current Outpatient Medications on File Prior to Visit  Medication Sig Dispense Refill  . 24HR ALLERGY RELIEF 180 MG tablet Take 180 mg by mouth 2 (two) times daily.    Marland Kitchen aspirin EC 81 MG tablet Take 81 mg by mouth daily.    Marland Kitchen atorvastatin (LIPITOR) 40 MG tablet     . atorvastatin (LIPITOR) 80 MG tablet TAKE 1 TABLET BY MOUTH  EVERY EVENING.    Marland Kitchen azithromycin (ZITHROMAX) 250 MG tablet     . carvedilol (COREG) 3.125 MG tablet Take 6.25 mg by mouth every 12 (twelve) hours.    . cefdinir (OMNICEF) 300 MG capsule     . clindamycin (CLEOCIN) 300 MG capsule Take 2 capsules (600mg ) 45 minutes prior to dental procedures 10 capsule 1  . clopidogrel (PLAVIX) 75 MG tablet TAKE 1 TABLET BY MOUTH DAILY.    Marland Kitchen COMBIVENT RESPIMAT 20-100 MCG/ACT AERS respimat     . doxycycline (MONODOX) 100 MG capsule TAKE 1 CAPSULE WITH FOOD TWICE DAILY UNTIL ALL GONE.    Marland Kitchen ezetimibe (ZETIA) 10 MG tablet Take 1 tablet (10 mg total) by mouth daily. 30 tablet 5  . fluocinonide ointment (LIDEX) 0.05 %     . glipiZIDE (GLUCOTROL) 5 MG tablet     . hydrOXYzine (ATARAX/VISTARIL) 25 MG tablet     . levofloxacin (LEVAQUIN) 500 MG tablet Take 1 tablet (500 mg total) by mouth daily. 14 tablet 0  . lisinopril (PRINIVIL,ZESTRIL) 5 MG tablet Take 5 mg by mouth daily.    . metFORMIN (GLUCOPHAGE) 1000 MG tablet TAKE ONE TABLET BY MOUTH TWICE DAILY    . metFORMIN (GLUCOPHAGE) 500 MG tablet TAKE 1 TABLET BY MOUTH EVERY TWELVE HOURS    . nitroGLYCERIN (NITROSTAT) 0.4 MG SL tablet PLACE 1 TABLET (0.4 MG TOTAL) UNDER THE TONGUE EVERY FIVE (5) MINUTES AS  NEEDED FOR CHEST PAIN.    Marland Kitchen oxyCODONE (OXY IR/ROXICODONE) 5 MG immediate release tablet     . predniSONE (DELTASONE) 20 MG tablet     . sulfamethoxazole-trimethoprim (BACTRIM) 400-80 MG tablet Take 1 tablet by mouth 2 (two) times daily. 28 tablet 0  . tamsulosin (FLOMAX) 0.4 MG CAPS capsule TAKE 1 CAPSULE DAILY, 30 MINUTES AFTER SUPPER.    Marland Kitchen topiramate (TOPAMAX) 50 MG tablet Take 1 tablet (50 mg total) by mouth daily. 30 tablet 3  . triamcinolone cream (KENALOG) 0.1 % Apply 1 application topically 2 (two) times daily.     No current facility-administered medications on file prior to visit.    Allergies  Allergen Reactions  . Penicillins Nausea And Vomiting  . Milk-Related Compounds Hives and Itching    No  results found for this or any previous visit (from the past 2160 hour(s)).  Objective: There were no vitals filed for this visit.  General: Patient is awake, alert, oriented x 3 and in no acute distress.  Dermatology: Skin is warm and dry bilateral with a partial thickness ulceration present  Distal tuft of the left first and third toes. Ulceration measures less than 0.5 cm.  There is a  Erythematous border with a fibrogranular base at the first toe wound and fibronecrotic base at the third toe wound on the left. The ulcerations do not probe to bone. There is minimal malodor, clear active drainage, focal digital edema. No other acute signs of infection. Severely elongated nails x5 on left foot.   Vascular: Pedal pulses are difficult to palpate due to leg wraps on the left.  Neurologic: Protective sensation absent bilateral.  Musculosketal: There is minimal pain with palpation to ulcerated areas on the left first and third toes. No pain with compression to calves bilateral.  Patient is status post right fourth toe amputation which remains healed.  Xrays, Right foot: At the involved toes there is no bony destruction suggestive of osteomyelitis. No gas in soft tissues.  There is diffuse arthritis and old history of fracture at the midfoot.  Mild soft tissue swelling.  No other acute findings.  No results for input(s): GRAMSTAIN, LABORGA in the last 8760 hours.  Assessment and Plan:  Problem List Items Addressed This Visit      Endocrine   Diabetic peripheral neuropathy ([place])    Other Visit Diagnoses    Toe ulcer, left, limited to breakdown of skin ([place])    -  Primary   Toe pain, left         -Examined patient and discussed the progression of the wound and treatment alternatives. -Xrays reviewed -Cleansed ulceration at toes on left -Applied Betadine and dry sterile dressing and instructed patient to continue with daily dressings consisting of the same covered with a loose white  stockinette -Prescribed clindamycin for patient to take as instructed - Advised patient to go to the ER or return to office if the wound worsens or if constitutional symptoms are present. -Dispense postoperative shoe to prevent rubbing to toes and to allow for wound healing -Patient to return to office in 2 to 3 weeks for follow up care and evaluation or sooner if problems arise. -Advised patient if he is struggling since he is living out of his car to please let us know and we can try with helping to find him some assistance.   [name], DPM

## 2019-03-23 DIAGNOSIS — L0390 Cellulitis, unspecified: Secondary | ICD-10-CM | POA: Diagnosis not present

## 2019-03-23 DIAGNOSIS — L02416 Cutaneous abscess of left lower limb: Secondary | ICD-10-CM | POA: Diagnosis not present

## 2019-03-23 DIAGNOSIS — I8310 Varicose veins of unspecified lower extremity with inflammation: Secondary | ICD-10-CM | POA: Diagnosis not present

## 2019-03-25 ENCOUNTER — Telehealth: Payer: Self-pay | Admitting: Cardiology

## 2019-03-25 DIAGNOSIS — R600 Localized edema: Secondary | ICD-10-CM | POA: Diagnosis not present

## 2019-03-25 DIAGNOSIS — M549 Dorsalgia, unspecified: Secondary | ICD-10-CM | POA: Diagnosis not present

## 2019-03-25 DIAGNOSIS — G8929 Other chronic pain: Secondary | ICD-10-CM | POA: Diagnosis not present

## 2019-03-25 NOTE — Telephone Encounter (Signed)
Patient saw his PCP this am and his legs are swelling really bad and she would like him seen sooner if possible, Patient already has an appt 04/20/2019 and echo appt 11/24.Marland Kitchen Please call him.Marland Kitchen

## 2019-03-25 NOTE — Telephone Encounter (Signed)
Left message for patient to return call.  Will continue efforts.  

## 2019-03-26 DIAGNOSIS — L97921 Non-pressure chronic ulcer of unspecified part of left lower leg limited to breakdown of skin: Secondary | ICD-10-CM | POA: Diagnosis not present

## 2019-03-26 DIAGNOSIS — L0390 Cellulitis, unspecified: Secondary | ICD-10-CM | POA: Diagnosis not present

## 2019-03-26 DIAGNOSIS — I8310 Varicose veins of unspecified lower extremity with inflammation: Secondary | ICD-10-CM | POA: Diagnosis not present

## 2019-03-26 DIAGNOSIS — L97911 Non-pressure chronic ulcer of unspecified part of right lower leg limited to breakdown of skin: Secondary | ICD-10-CM | POA: Diagnosis not present

## 2019-03-26 DIAGNOSIS — R600 Localized edema: Secondary | ICD-10-CM | POA: Diagnosis not present

## 2019-03-27 DIAGNOSIS — Z955 Presence of coronary angioplasty implant and graft: Secondary | ICD-10-CM | POA: Diagnosis not present

## 2019-03-27 DIAGNOSIS — I252 Old myocardial infarction: Secondary | ICD-10-CM | POA: Diagnosis not present

## 2019-03-27 DIAGNOSIS — I429 Cardiomyopathy, unspecified: Secondary | ICD-10-CM | POA: Diagnosis not present

## 2019-03-27 DIAGNOSIS — I110 Hypertensive heart disease with heart failure: Secondary | ICD-10-CM | POA: Diagnosis not present

## 2019-03-27 DIAGNOSIS — I509 Heart failure, unspecified: Secondary | ICD-10-CM | POA: Diagnosis not present

## 2019-03-27 DIAGNOSIS — J449 Chronic obstructive pulmonary disease, unspecified: Secondary | ICD-10-CM | POA: Diagnosis not present

## 2019-03-27 DIAGNOSIS — R079 Chest pain, unspecified: Secondary | ICD-10-CM | POA: Diagnosis not present

## 2019-03-27 DIAGNOSIS — Z8673 Personal history of transient ischemic attack (TIA), and cerebral infarction without residual deficits: Secondary | ICD-10-CM | POA: Diagnosis not present

## 2019-03-27 DIAGNOSIS — I208 Other forms of angina pectoris: Secondary | ICD-10-CM | POA: Diagnosis not present

## 2019-03-27 DIAGNOSIS — I25118 Atherosclerotic heart disease of native coronary artery with other forms of angina pectoris: Secondary | ICD-10-CM | POA: Diagnosis not present

## 2019-03-28 DIAGNOSIS — E7800 Pure hypercholesterolemia, unspecified: Secondary | ICD-10-CM | POA: Diagnosis not present

## 2019-03-28 DIAGNOSIS — Z8673 Personal history of transient ischemic attack (TIA), and cerebral infarction without residual deficits: Secondary | ICD-10-CM | POA: Diagnosis not present

## 2019-03-28 DIAGNOSIS — Z955 Presence of coronary angioplasty implant and graft: Secondary | ICD-10-CM | POA: Diagnosis not present

## 2019-03-28 DIAGNOSIS — Z716 Tobacco abuse counseling: Secondary | ICD-10-CM | POA: Diagnosis not present

## 2019-03-28 DIAGNOSIS — E1142 Type 2 diabetes mellitus with diabetic polyneuropathy: Secondary | ICD-10-CM | POA: Diagnosis not present

## 2019-03-28 DIAGNOSIS — I498 Other specified cardiac arrhythmias: Secondary | ICD-10-CM | POA: Diagnosis not present

## 2019-03-28 DIAGNOSIS — E11621 Type 2 diabetes mellitus with foot ulcer: Secondary | ICD-10-CM | POA: Diagnosis present

## 2019-03-28 DIAGNOSIS — R079 Chest pain, unspecified: Secondary | ICD-10-CM | POA: Diagnosis not present

## 2019-03-28 DIAGNOSIS — I2510 Atherosclerotic heart disease of native coronary artery without angina pectoris: Secondary | ICD-10-CM | POA: Diagnosis not present

## 2019-03-28 DIAGNOSIS — R9431 Abnormal electrocardiogram [ECG] [EKG]: Secondary | ICD-10-CM | POA: Diagnosis not present

## 2019-03-28 DIAGNOSIS — Z7982 Long term (current) use of aspirin: Secondary | ICD-10-CM | POA: Diagnosis not present

## 2019-03-28 DIAGNOSIS — E118 Type 2 diabetes mellitus with unspecified complications: Secondary | ICD-10-CM | POA: Diagnosis not present

## 2019-03-28 DIAGNOSIS — E7849 Other hyperlipidemia: Secondary | ICD-10-CM | POA: Diagnosis not present

## 2019-03-28 DIAGNOSIS — R0789 Other chest pain: Secondary | ICD-10-CM | POA: Diagnosis not present

## 2019-03-28 DIAGNOSIS — L97529 Non-pressure chronic ulcer of other part of left foot with unspecified severity: Secondary | ICD-10-CM | POA: Diagnosis present

## 2019-03-28 DIAGNOSIS — F17200 Nicotine dependence, unspecified, uncomplicated: Secondary | ICD-10-CM | POA: Diagnosis present

## 2019-03-28 DIAGNOSIS — N400 Enlarged prostate without lower urinary tract symptoms: Secondary | ICD-10-CM | POA: Diagnosis present

## 2019-03-28 DIAGNOSIS — I110 Hypertensive heart disease with heart failure: Secondary | ICD-10-CM | POA: Diagnosis not present

## 2019-03-28 DIAGNOSIS — R0602 Shortness of breath: Secondary | ICD-10-CM | POA: Diagnosis not present

## 2019-03-28 DIAGNOSIS — Z20828 Contact with and (suspected) exposure to other viral communicable diseases: Secondary | ICD-10-CM | POA: Diagnosis present

## 2019-03-28 DIAGNOSIS — I10 Essential (primary) hypertension: Secondary | ICD-10-CM | POA: Diagnosis not present

## 2019-03-28 DIAGNOSIS — S61412A Laceration without foreign body of left hand, initial encounter: Secondary | ICD-10-CM | POA: Diagnosis not present

## 2019-03-28 DIAGNOSIS — I25118 Atherosclerotic heart disease of native coronary artery with other forms of angina pectoris: Secondary | ICD-10-CM | POA: Diagnosis not present

## 2019-03-28 DIAGNOSIS — I517 Cardiomegaly: Secondary | ICD-10-CM | POA: Diagnosis not present

## 2019-03-28 DIAGNOSIS — Z7902 Long term (current) use of antithrombotics/antiplatelets: Secondary | ICD-10-CM | POA: Diagnosis not present

## 2019-03-28 DIAGNOSIS — I200 Unstable angina: Secondary | ICD-10-CM | POA: Diagnosis not present

## 2019-03-28 DIAGNOSIS — Z79899 Other long term (current) drug therapy: Secondary | ICD-10-CM | POA: Diagnosis not present

## 2019-03-28 DIAGNOSIS — I429 Cardiomyopathy, unspecified: Secondary | ICD-10-CM | POA: Diagnosis not present

## 2019-03-28 DIAGNOSIS — Z794 Long term (current) use of insulin: Secondary | ICD-10-CM | POA: Diagnosis not present

## 2019-03-28 DIAGNOSIS — R001 Bradycardia, unspecified: Secondary | ICD-10-CM | POA: Diagnosis not present

## 2019-03-28 DIAGNOSIS — M79602 Pain in left arm: Secondary | ICD-10-CM | POA: Diagnosis not present

## 2019-03-28 DIAGNOSIS — Z590 Homelessness: Secondary | ICD-10-CM | POA: Diagnosis not present

## 2019-03-28 DIAGNOSIS — E782 Mixed hyperlipidemia: Secondary | ICD-10-CM | POA: Diagnosis present

## 2019-03-28 DIAGNOSIS — L97519 Non-pressure chronic ulcer of other part of right foot with unspecified severity: Secondary | ICD-10-CM | POA: Diagnosis present

## 2019-03-28 DIAGNOSIS — I5022 Chronic systolic (congestive) heart failure: Secondary | ICD-10-CM | POA: Diagnosis present

## 2019-03-28 DIAGNOSIS — I252 Old myocardial infarction: Secondary | ICD-10-CM | POA: Diagnosis not present

## 2019-03-28 DIAGNOSIS — F17210 Nicotine dependence, cigarettes, uncomplicated: Secondary | ICD-10-CM | POA: Diagnosis not present

## 2019-03-28 DIAGNOSIS — J449 Chronic obstructive pulmonary disease, unspecified: Secondary | ICD-10-CM | POA: Diagnosis not present

## 2019-03-28 DIAGNOSIS — I350 Nonrheumatic aortic (valve) stenosis: Secondary | ICD-10-CM | POA: Diagnosis not present

## 2019-03-28 DIAGNOSIS — I25110 Atherosclerotic heart disease of native coronary artery with unstable angina pectoris: Secondary | ICD-10-CM | POA: Diagnosis present

## 2019-03-28 DIAGNOSIS — E119 Type 2 diabetes mellitus without complications: Secondary | ICD-10-CM | POA: Diagnosis not present

## 2019-03-28 DIAGNOSIS — I509 Heart failure, unspecified: Secondary | ICD-10-CM | POA: Diagnosis not present

## 2019-03-30 DIAGNOSIS — I200 Unstable angina: Secondary | ICD-10-CM

## 2019-03-30 DIAGNOSIS — S61412A Laceration without foreign body of left hand, initial encounter: Secondary | ICD-10-CM | POA: Diagnosis not present

## 2019-03-30 DIAGNOSIS — F17210 Nicotine dependence, cigarettes, uncomplicated: Secondary | ICD-10-CM | POA: Diagnosis not present

## 2019-03-30 DIAGNOSIS — E119 Type 2 diabetes mellitus without complications: Secondary | ICD-10-CM | POA: Diagnosis not present

## 2019-03-30 HISTORY — DX: Unstable angina: I20.0

## 2019-04-02 DIAGNOSIS — I10 Essential (primary) hypertension: Secondary | ICD-10-CM | POA: Diagnosis not present

## 2019-04-02 DIAGNOSIS — I2510 Atherosclerotic heart disease of native coronary artery without angina pectoris: Secondary | ICD-10-CM | POA: Diagnosis not present

## 2019-04-02 DIAGNOSIS — I998 Other disorder of circulatory system: Secondary | ICD-10-CM | POA: Diagnosis not present

## 2019-04-09 ENCOUNTER — Other Ambulatory Visit: Payer: Self-pay

## 2019-04-09 ENCOUNTER — Ambulatory Visit (INDEPENDENT_AMBULATORY_CARE_PROVIDER_SITE_OTHER): Payer: Medicare Other | Admitting: Sports Medicine

## 2019-04-09 ENCOUNTER — Encounter: Payer: Self-pay | Admitting: Sports Medicine

## 2019-04-09 DIAGNOSIS — M79675 Pain in left toe(s): Secondary | ICD-10-CM | POA: Diagnosis not present

## 2019-04-09 DIAGNOSIS — L97521 Non-pressure chronic ulcer of other part of left foot limited to breakdown of skin: Secondary | ICD-10-CM | POA: Diagnosis not present

## 2019-04-09 DIAGNOSIS — I639 Cerebral infarction, unspecified: Secondary | ICD-10-CM | POA: Diagnosis not present

## 2019-04-09 DIAGNOSIS — E1142 Type 2 diabetes mellitus with diabetic polyneuropathy: Secondary | ICD-10-CM | POA: Diagnosis not present

## 2019-04-09 NOTE — Progress Notes (Signed)
Subjective: Craig Rollins is a 63 y.o. male patient seen in office for follow up evaluation of ulceration of the left first and third toes. Patient has a history of diabetes and a blood glucose level today of 207.  Patient reports that he thinks that it is a little bit better with some pain every day reports that he was hospitalized Saturday night because of chest pain and had a vein study in his legs that showed that his circulation was good reports that he also saw his dermatology since last visit who applied gentian violet to the area and gave him a refill on his clindamycin.  Patient denies nausea vomiting fever night sweats shortness of breath or chills.  Patient has no other pedal complaints at this time.  Patient Active Problem List   Diagnosis Date Noted  . Chest discomfort 08/13/2018  . CHF (congestive heart failure) ([place]) 08/13/2018  . COPD (chronic obstructive pulmonary disease) ([place]) 08/13/2018  . Open wound 08/13/2018  . Shortness of breath 08/13/2018  . CAD in native artery 02/18/2018  . Aortic stenosis 02/18/2018  . Essential hypertension 02/18/2018  . Foot swelling 01/26/2018  . Diabetes mellitus, type 2 ([place]) 01/26/2018  . Diabetic peripheral neuropathy ([place]) 02/25/2017  . Hammer toes of both feet 02/25/2017  . Risk for falls 09/23/2016  . NSTEMI (non-ST elevated myocardial infarction) ([place]) 08/30/2016  . Wound eschar of foot 08/21/2016  . Dilated cardiomyopathy ([place]) 07/16/2016  . Dyslipidemia (high LDL; low HDL) 07/16/2016  . Noncompliance 07/16/2016  . Smoking 07/16/2016  . Stroke ([place]) 12/19/2015   Current Outpatient Medications on File Prior to Visit  Medication Sig Dispense Refill  . 24HR ALLERGY RELIEF 180 MG tablet Take 180 mg by mouth 2 (two) times daily.    Marland Kitchen aspirin EC 81 MG tablet Take 81 mg by mouth daily.    Marland Kitchen atorvastatin (LIPITOR) 40 MG tablet     . atorvastatin (LIPITOR) 80 MG tablet TAKE 1 TABLET BY MOUTH EVERY EVENING.    Marland Kitchen azithromycin  (ZITHROMAX) 250 MG tablet     . carvedilol (COREG) 3.125 MG tablet Take 6.25 mg by mouth every 12 (twelve) hours.    . cefdinir (OMNICEF) 300 MG capsule     . clindamycin (CLEOCIN) 300 MG capsule Take 2 capsules (600mg ) 45 minutes prior to dental procedures 10 capsule 1  . clindamycin (CLEOCIN) 300 MG capsule Take 1 capsule (300 mg total) by mouth 3 (three) times daily. 42 capsule 0  . clopidogrel (PLAVIX) 75 MG tablet TAKE 1 TABLET BY MOUTH DAILY.    Marland Kitchen COMBIVENT RESPIMAT 20-100 MCG/ACT AERS respimat     . doxycycline (MONODOX) 100 MG capsule TAKE 1 CAPSULE WITH FOOD TWICE DAILY UNTIL ALL GONE.    Marland Kitchen ezetimibe (ZETIA) 10 MG tablet Take 1 tablet (10 mg total) by mouth daily. 30 tablet 5  . fluocinonide ointment (LIDEX) 0.05 %     . glipiZIDE (GLUCOTROL) 5 MG tablet     . hydrOXYzine (ATARAX/VISTARIL) 25 MG tablet     . levofloxacin (LEVAQUIN) 500 MG tablet Take 1 tablet (500 mg total) by mouth daily. 14 tablet 0  . lisinopril (PRINIVIL,ZESTRIL) 5 MG tablet Take 5 mg by mouth daily.    . metFORMIN (GLUCOPHAGE) 1000 MG tablet TAKE ONE TABLET BY MOUTH TWICE DAILY    . metFORMIN (GLUCOPHAGE) 500 MG tablet TAKE 1 TABLET BY MOUTH EVERY TWELVE HOURS    . nitroGLYCERIN (NITROSTAT) 0.4 MG SL tablet PLACE 1 TABLET (0.4 MG TOTAL)  UNDER THE TONGUE EVERY FIVE (5) MINUTES AS NEEDED FOR CHEST PAIN.    Marland Kitchen oxyCODONE (OXY IR/ROXICODONE) 5 MG immediate release tablet     . predniSONE (DELTASONE) 20 MG tablet     . sulfamethoxazole-trimethoprim (BACTRIM) 400-80 MG tablet Take 1 tablet by mouth 2 (two) times daily. 28 tablet 0  . tamsulosin (FLOMAX) 0.4 MG CAPS capsule TAKE 1 CAPSULE DAILY, 30 MINUTES AFTER SUPPER.    Marland Kitchen topiramate (TOPAMAX) 50 MG tablet Take 1 tablet (50 mg total) by mouth daily. 30 tablet 3  . triamcinolone cream (KENALOG) 0.1 % Apply 1 application topically 2 (two) times daily.     No current facility-administered medications on file prior to visit.    Allergies  Allergen Reactions  .  Penicillins Nausea And Vomiting  . Milk Protein Itching and Hives  . Milk-Related Compounds Hives and Itching    No results found for this or any previous visit (from the past 2160 hour(s)).  Objective: There were no vitals filed for this visit.  General: Patient is awake, alert, oriented x 3 and in no acute distress.  Dermatology: Skin is warm and dry bilateral with a partial thickness ulceration present  Distal tuft of the left first and third toes. Ulceration measures less than 0.3 cm.  There is a  Erythematous border with a fibrogranular base at the first toe wound and fibronecrotic base at the third toe wound on the left. The ulcerations do not probe to bone. There is minimal malodor, decreased clear active drainage, focal digital edema. No other acute signs of infection.   Vascular: Pedal pulses are difficult to palpate due 1+ swelling.  Neurologic: Protective sensation absent bilateral.  Musculosketal: There is minimal pain with palpation to ulcerated areas on the left first and third toes. No pain with compression to calves bilateral.  Patient is status post right fourth toe amputation which remains healed.  No results for input(s): GRAMSTAIN, LABORGA in the last 8760 hours.  Assessment and Plan:  Problem List Items Addressed This Visit      Endocrine   Diabetic peripheral neuropathy ([place])    Other Visit Diagnoses    Toe ulcer, left, limited to breakdown of skin ([place])    -  Primary   Toe pain, left         -Examined patient and discussed the progression of the wound and treatment alternatives. -Cleansed ulceration at toes on left -Applied Betadine and dry sterile dressing and instructed patient to continue with daily dressings consisting of the same covered coban or loose stockinette -Continue with clindamycin for patient to take as instructed - Advised patient to go to the ER or return to office if the wound worsens or if constitutional symptoms are present. -Advised  post op shoe but patient is in tennis shoe at today's visit -Patient to return to office in 2 to 3 weeks for follow up care and evaluation or sooner if problems arise.  [name], DPM

## 2019-04-11 DIAGNOSIS — I2694 Multiple subsegmental pulmonary emboli without acute cor pulmonale: Secondary | ICD-10-CM | POA: Diagnosis not present

## 2019-04-11 DIAGNOSIS — I714 Abdominal aortic aneurysm, without rupture: Secondary | ICD-10-CM | POA: Diagnosis not present

## 2019-04-11 DIAGNOSIS — I2699 Other pulmonary embolism without acute cor pulmonale: Secondary | ICD-10-CM | POA: Diagnosis not present

## 2019-04-11 DIAGNOSIS — R079 Chest pain, unspecified: Secondary | ICD-10-CM | POA: Diagnosis not present

## 2019-04-11 DIAGNOSIS — R0602 Shortness of breath: Secondary | ICD-10-CM | POA: Diagnosis not present

## 2019-04-11 DIAGNOSIS — R0789 Other chest pain: Secondary | ICD-10-CM | POA: Diagnosis not present

## 2019-04-11 DIAGNOSIS — I700 Atherosclerosis of aorta: Secondary | ICD-10-CM | POA: Diagnosis not present

## 2019-04-11 DIAGNOSIS — I2510 Atherosclerotic heart disease of native coronary artery without angina pectoris: Secondary | ICD-10-CM | POA: Diagnosis not present

## 2019-04-11 DIAGNOSIS — Z9114 Patient's other noncompliance with medication regimen: Secondary | ICD-10-CM | POA: Diagnosis not present

## 2019-04-12 DIAGNOSIS — J449 Chronic obstructive pulmonary disease, unspecified: Secondary | ICD-10-CM | POA: Diagnosis present

## 2019-04-12 DIAGNOSIS — I252 Old myocardial infarction: Secondary | ICD-10-CM | POA: Diagnosis not present

## 2019-04-12 DIAGNOSIS — J9601 Acute respiratory failure with hypoxia: Secondary | ICD-10-CM | POA: Diagnosis present

## 2019-04-12 DIAGNOSIS — R0602 Shortness of breath: Secondary | ICD-10-CM | POA: Diagnosis not present

## 2019-04-12 DIAGNOSIS — Z8673 Personal history of transient ischemic attack (TIA), and cerebral infarction without residual deficits: Secondary | ICD-10-CM | POA: Diagnosis not present

## 2019-04-12 DIAGNOSIS — E119 Type 2 diabetes mellitus without complications: Secondary | ICD-10-CM | POA: Diagnosis present

## 2019-04-12 DIAGNOSIS — I2510 Atherosclerotic heart disease of native coronary artery without angina pectoris: Secondary | ICD-10-CM | POA: Diagnosis not present

## 2019-04-12 DIAGNOSIS — E1165 Type 2 diabetes mellitus with hyperglycemia: Secondary | ICD-10-CM | POA: Diagnosis not present

## 2019-04-12 DIAGNOSIS — I700 Atherosclerosis of aorta: Secondary | ICD-10-CM | POA: Diagnosis not present

## 2019-04-12 DIAGNOSIS — R079 Chest pain, unspecified: Secondary | ICD-10-CM | POA: Diagnosis not present

## 2019-04-12 DIAGNOSIS — I10 Essential (primary) hypertension: Secondary | ICD-10-CM | POA: Diagnosis present

## 2019-04-12 DIAGNOSIS — Z7982 Long term (current) use of aspirin: Secondary | ICD-10-CM | POA: Diagnosis not present

## 2019-04-12 DIAGNOSIS — E441 Mild protein-calorie malnutrition: Secondary | ICD-10-CM | POA: Diagnosis present

## 2019-04-12 DIAGNOSIS — Z794 Long term (current) use of insulin: Secondary | ICD-10-CM | POA: Diagnosis not present

## 2019-04-12 DIAGNOSIS — R600 Localized edema: Secondary | ICD-10-CM | POA: Diagnosis not present

## 2019-04-12 DIAGNOSIS — I249 Acute ischemic heart disease, unspecified: Secondary | ICD-10-CM | POA: Diagnosis present

## 2019-04-12 DIAGNOSIS — R0789 Other chest pain: Secondary | ICD-10-CM | POA: Diagnosis not present

## 2019-04-12 DIAGNOSIS — D72829 Elevated white blood cell count, unspecified: Secondary | ICD-10-CM | POA: Diagnosis present

## 2019-04-12 DIAGNOSIS — I714 Abdominal aortic aneurysm, without rupture: Secondary | ICD-10-CM | POA: Diagnosis not present

## 2019-04-12 DIAGNOSIS — Z23 Encounter for immunization: Secondary | ICD-10-CM | POA: Diagnosis not present

## 2019-04-12 DIAGNOSIS — I2699 Other pulmonary embolism without acute cor pulmonale: Secondary | ICD-10-CM | POA: Diagnosis not present

## 2019-04-12 DIAGNOSIS — E669 Obesity, unspecified: Secondary | ICD-10-CM | POA: Diagnosis present

## 2019-04-12 DIAGNOSIS — I872 Venous insufficiency (chronic) (peripheral): Secondary | ICD-10-CM | POA: Diagnosis present

## 2019-04-12 DIAGNOSIS — Z79899 Other long term (current) drug therapy: Secondary | ICD-10-CM | POA: Diagnosis not present

## 2019-04-12 DIAGNOSIS — E785 Hyperlipidemia, unspecified: Secondary | ICD-10-CM | POA: Diagnosis present

## 2019-04-12 DIAGNOSIS — F17210 Nicotine dependence, cigarettes, uncomplicated: Secondary | ICD-10-CM | POA: Diagnosis present

## 2019-04-12 DIAGNOSIS — Z6831 Body mass index (BMI) 31.0-31.9, adult: Secondary | ICD-10-CM | POA: Diagnosis not present

## 2019-04-12 DIAGNOSIS — M1990 Unspecified osteoarthritis, unspecified site: Secondary | ICD-10-CM | POA: Diagnosis present

## 2019-04-12 DIAGNOSIS — I2694 Multiple subsegmental pulmonary emboli without acute cor pulmonale: Secondary | ICD-10-CM | POA: Diagnosis not present

## 2019-04-12 DIAGNOSIS — I517 Cardiomegaly: Secondary | ICD-10-CM | POA: Diagnosis not present

## 2019-04-12 DIAGNOSIS — Z9114 Patient's other noncompliance with medication regimen: Secondary | ICD-10-CM | POA: Diagnosis not present

## 2019-04-12 DIAGNOSIS — Z880 Allergy status to penicillin: Secondary | ICD-10-CM | POA: Diagnosis not present

## 2019-04-12 DIAGNOSIS — Z955 Presence of coronary angioplasty implant and graft: Secondary | ICD-10-CM | POA: Diagnosis not present

## 2019-04-13 ENCOUNTER — Other Ambulatory Visit: Payer: Medicare Other

## 2019-04-13 DIAGNOSIS — E1165 Type 2 diabetes mellitus with hyperglycemia: Secondary | ICD-10-CM | POA: Diagnosis not present

## 2019-04-13 DIAGNOSIS — R079 Chest pain, unspecified: Secondary | ICD-10-CM

## 2019-04-13 DIAGNOSIS — I2510 Atherosclerotic heart disease of native coronary artery without angina pectoris: Secondary | ICD-10-CM

## 2019-04-13 DIAGNOSIS — I714 Abdominal aortic aneurysm, without rupture: Secondary | ICD-10-CM | POA: Diagnosis not present

## 2019-04-13 DIAGNOSIS — Z9114 Patient's other noncompliance with medication regimen: Secondary | ICD-10-CM

## 2019-04-13 DIAGNOSIS — I2699 Other pulmonary embolism without acute cor pulmonale: Secondary | ICD-10-CM

## 2019-04-14 DIAGNOSIS — Z7902 Long term (current) use of antithrombotics/antiplatelets: Secondary | ICD-10-CM | POA: Diagnosis not present

## 2019-04-14 DIAGNOSIS — I2510 Atherosclerotic heart disease of native coronary artery without angina pectoris: Secondary | ICD-10-CM | POA: Diagnosis not present

## 2019-04-14 DIAGNOSIS — I2699 Other pulmonary embolism without acute cor pulmonale: Secondary | ICD-10-CM | POA: Diagnosis not present

## 2019-04-14 DIAGNOSIS — Z09 Encounter for follow-up examination after completed treatment for conditions other than malignant neoplasm: Secondary | ICD-10-CM | POA: Diagnosis not present

## 2019-04-16 DIAGNOSIS — E119 Type 2 diabetes mellitus without complications: Secondary | ICD-10-CM | POA: Diagnosis not present

## 2019-04-16 DIAGNOSIS — N200 Calculus of kidney: Secondary | ICD-10-CM | POA: Diagnosis not present

## 2019-04-16 DIAGNOSIS — R079 Chest pain, unspecified: Secondary | ICD-10-CM | POA: Diagnosis not present

## 2019-04-16 DIAGNOSIS — N281 Cyst of kidney, acquired: Secondary | ICD-10-CM | POA: Diagnosis not present

## 2019-04-16 DIAGNOSIS — I639 Cerebral infarction, unspecified: Secondary | ICD-10-CM | POA: Diagnosis not present

## 2019-04-16 DIAGNOSIS — I2510 Atherosclerotic heart disease of native coronary artery without angina pectoris: Secondary | ICD-10-CM | POA: Diagnosis not present

## 2019-04-16 DIAGNOSIS — I700 Atherosclerosis of aorta: Secondary | ICD-10-CM | POA: Diagnosis not present

## 2019-04-16 DIAGNOSIS — M47816 Spondylosis without myelopathy or radiculopathy, lumbar region: Secondary | ICD-10-CM | POA: Diagnosis not present

## 2019-04-16 DIAGNOSIS — N179 Acute kidney failure, unspecified: Secondary | ICD-10-CM | POA: Diagnosis not present

## 2019-04-16 DIAGNOSIS — R29818 Other symptoms and signs involving the nervous system: Secondary | ICD-10-CM | POA: Diagnosis not present

## 2019-04-16 DIAGNOSIS — R195 Other fecal abnormalities: Secondary | ICD-10-CM | POA: Diagnosis not present

## 2019-04-17 DIAGNOSIS — E11621 Type 2 diabetes mellitus with foot ulcer: Secondary | ICD-10-CM

## 2019-04-17 DIAGNOSIS — R0789 Other chest pain: Secondary | ICD-10-CM

## 2019-04-17 DIAGNOSIS — I6389 Other cerebral infarction: Secondary | ICD-10-CM | POA: Diagnosis not present

## 2019-04-17 DIAGNOSIS — I639 Cerebral infarction, unspecified: Secondary | ICD-10-CM | POA: Diagnosis not present

## 2019-04-17 DIAGNOSIS — R079 Chest pain, unspecified: Secondary | ICD-10-CM | POA: Diagnosis not present

## 2019-04-17 DIAGNOSIS — G459 Transient cerebral ischemic attack, unspecified: Secondary | ICD-10-CM | POA: Diagnosis not present

## 2019-04-17 DIAGNOSIS — I2510 Atherosclerotic heart disease of native coronary artery without angina pectoris: Secondary | ICD-10-CM

## 2019-04-17 DIAGNOSIS — Z720 Tobacco use: Secondary | ICD-10-CM

## 2019-04-17 DIAGNOSIS — E785 Hyperlipidemia, unspecified: Secondary | ICD-10-CM

## 2019-04-17 DIAGNOSIS — N179 Acute kidney failure, unspecified: Secondary | ICD-10-CM | POA: Diagnosis not present

## 2019-04-17 DIAGNOSIS — I2699 Other pulmonary embolism without acute cor pulmonale: Secondary | ICD-10-CM | POA: Diagnosis not present

## 2019-04-17 DIAGNOSIS — E119 Type 2 diabetes mellitus without complications: Secondary | ICD-10-CM | POA: Diagnosis not present

## 2019-04-18 DIAGNOSIS — I110 Hypertensive heart disease with heart failure: Secondary | ICD-10-CM | POA: Diagnosis present

## 2019-04-18 DIAGNOSIS — F17210 Nicotine dependence, cigarettes, uncomplicated: Secondary | ICD-10-CM | POA: Diagnosis present

## 2019-04-18 DIAGNOSIS — Z955 Presence of coronary angioplasty implant and graft: Secondary | ICD-10-CM | POA: Diagnosis not present

## 2019-04-18 DIAGNOSIS — R0789 Other chest pain: Secondary | ICD-10-CM | POA: Diagnosis present

## 2019-04-18 DIAGNOSIS — Z7401 Bed confinement status: Secondary | ICD-10-CM | POA: Diagnosis not present

## 2019-04-18 DIAGNOSIS — R079 Chest pain, unspecified: Secondary | ICD-10-CM | POA: Diagnosis not present

## 2019-04-18 DIAGNOSIS — G459 Transient cerebral ischemic attack, unspecified: Secondary | ICD-10-CM | POA: Diagnosis not present

## 2019-04-18 DIAGNOSIS — Z716 Tobacco abuse counseling: Secondary | ICD-10-CM | POA: Diagnosis not present

## 2019-04-18 DIAGNOSIS — E11621 Type 2 diabetes mellitus with foot ulcer: Secondary | ICD-10-CM | POA: Diagnosis present

## 2019-04-18 DIAGNOSIS — G8191 Hemiplegia, unspecified affecting right dominant side: Secondary | ICD-10-CM | POA: Diagnosis present

## 2019-04-18 DIAGNOSIS — L97519 Non-pressure chronic ulcer of other part of right foot with unspecified severity: Secondary | ICD-10-CM | POA: Diagnosis present

## 2019-04-18 DIAGNOSIS — I509 Heart failure, unspecified: Secondary | ICD-10-CM | POA: Diagnosis present

## 2019-04-18 DIAGNOSIS — I639 Cerebral infarction, unspecified: Secondary | ICD-10-CM | POA: Diagnosis not present

## 2019-04-18 DIAGNOSIS — Z9181 History of falling: Secondary | ICD-10-CM | POA: Diagnosis not present

## 2019-04-18 DIAGNOSIS — I2699 Other pulmonary embolism without acute cor pulmonale: Secondary | ICD-10-CM | POA: Diagnosis not present

## 2019-04-18 DIAGNOSIS — N179 Acute kidney failure, unspecified: Secondary | ICD-10-CM | POA: Diagnosis not present

## 2019-04-18 DIAGNOSIS — E119 Type 2 diabetes mellitus without complications: Secondary | ICD-10-CM | POA: Diagnosis not present

## 2019-04-18 DIAGNOSIS — Z7901 Long term (current) use of anticoagulants: Secondary | ICD-10-CM | POA: Diagnosis not present

## 2019-04-18 DIAGNOSIS — Z993 Dependence on wheelchair: Secondary | ICD-10-CM | POA: Diagnosis not present

## 2019-04-18 DIAGNOSIS — F17200 Nicotine dependence, unspecified, uncomplicated: Secondary | ICD-10-CM | POA: Diagnosis not present

## 2019-04-18 DIAGNOSIS — E669 Obesity, unspecified: Secondary | ICD-10-CM | POA: Diagnosis present

## 2019-04-18 DIAGNOSIS — I2510 Atherosclerotic heart disease of native coronary artery without angina pectoris: Secondary | ICD-10-CM | POA: Diagnosis present

## 2019-04-18 DIAGNOSIS — E785 Hyperlipidemia, unspecified: Secondary | ICD-10-CM | POA: Diagnosis not present

## 2019-04-18 DIAGNOSIS — R5381 Other malaise: Secondary | ICD-10-CM | POA: Diagnosis present

## 2019-04-18 DIAGNOSIS — Z6835 Body mass index (BMI) 35.0-35.9, adult: Secondary | ICD-10-CM | POA: Diagnosis not present

## 2019-04-18 DIAGNOSIS — Z7902 Long term (current) use of antithrombotics/antiplatelets: Secondary | ICD-10-CM | POA: Diagnosis not present

## 2019-04-18 DIAGNOSIS — R0902 Hypoxemia: Secondary | ICD-10-CM | POA: Diagnosis not present

## 2019-04-18 DIAGNOSIS — M1990 Unspecified osteoarthritis, unspecified site: Secondary | ICD-10-CM | POA: Diagnosis present

## 2019-04-18 DIAGNOSIS — I739 Peripheral vascular disease, unspecified: Secondary | ICD-10-CM | POA: Diagnosis present

## 2019-04-18 DIAGNOSIS — Z8673 Personal history of transient ischemic attack (TIA), and cerebral infarction without residual deficits: Secondary | ICD-10-CM | POA: Diagnosis not present

## 2019-04-18 DIAGNOSIS — J449 Chronic obstructive pulmonary disease, unspecified: Secondary | ICD-10-CM | POA: Diagnosis present

## 2019-04-18 DIAGNOSIS — Z86711 Personal history of pulmonary embolism: Secondary | ICD-10-CM | POA: Diagnosis not present

## 2019-04-19 DIAGNOSIS — R0902 Hypoxemia: Secondary | ICD-10-CM | POA: Diagnosis not present

## 2019-04-19 DIAGNOSIS — F17200 Nicotine dependence, unspecified, uncomplicated: Secondary | ICD-10-CM | POA: Diagnosis not present

## 2019-04-19 DIAGNOSIS — I639 Cerebral infarction, unspecified: Secondary | ICD-10-CM | POA: Diagnosis not present

## 2019-04-19 DIAGNOSIS — E785 Hyperlipidemia, unspecified: Secondary | ICD-10-CM | POA: Diagnosis not present

## 2019-04-19 DIAGNOSIS — E11621 Type 2 diabetes mellitus with foot ulcer: Secondary | ICD-10-CM | POA: Diagnosis not present

## 2019-04-19 DIAGNOSIS — Z7401 Bed confinement status: Secondary | ICD-10-CM | POA: Diagnosis not present

## 2019-04-19 DIAGNOSIS — J449 Chronic obstructive pulmonary disease, unspecified: Secondary | ICD-10-CM | POA: Diagnosis not present

## 2019-04-19 DIAGNOSIS — I2699 Other pulmonary embolism without acute cor pulmonale: Secondary | ICD-10-CM | POA: Diagnosis not present

## 2019-04-19 DIAGNOSIS — M1990 Unspecified osteoarthritis, unspecified site: Secondary | ICD-10-CM | POA: Diagnosis not present

## 2019-04-19 DIAGNOSIS — I2510 Atherosclerotic heart disease of native coronary artery without angina pectoris: Secondary | ICD-10-CM | POA: Diagnosis not present

## 2019-04-19 DIAGNOSIS — G459 Transient cerebral ischemic attack, unspecified: Secondary | ICD-10-CM | POA: Diagnosis not present

## 2019-04-19 DIAGNOSIS — I509 Heart failure, unspecified: Secondary | ICD-10-CM | POA: Diagnosis not present

## 2019-04-19 DIAGNOSIS — I739 Peripheral vascular disease, unspecified: Secondary | ICD-10-CM | POA: Diagnosis not present

## 2019-04-19 DIAGNOSIS — R0789 Other chest pain: Secondary | ICD-10-CM | POA: Diagnosis not present

## 2019-04-19 DIAGNOSIS — R079 Chest pain, unspecified: Secondary | ICD-10-CM | POA: Diagnosis not present

## 2019-04-19 DIAGNOSIS — N179 Acute kidney failure, unspecified: Secondary | ICD-10-CM | POA: Diagnosis not present

## 2019-04-19 DIAGNOSIS — E119 Type 2 diabetes mellitus without complications: Secondary | ICD-10-CM | POA: Diagnosis not present

## 2019-04-19 NOTE — Progress Notes (Deleted)
Cardiology Office Note:    Date:  04/19/2019   ID:  Craig Rollins, DOB 1956-05-14, MRN [id number]  PCP:  [name], NP  Cardiologist:  Shirlee More, MD    Referring MD: [name], NP    ASSESSMENT:    No diagnosis found. PLAN:    In order of problems listed above:  1. ***   Next appointment: ***   Medication Adjustments/Labs and Tests Ordered: Current medicines are reviewed at length with the patient today.  Concerns regarding medicines are outlined above.  No orders of the defined types were placed in this encounter.  No orders of the defined types were placed in this encounter.   No chief complaint on file.   History of Present Illness:    Craig Rollins is a 63 y.o. male with a hx of CAD, PCI of LAD and RCA, 09/26/16  cardiomyopathy EF 20-25 %, mild to moderate AR,  hypertension, T2 DM and hyperliipidemia and stroke 2019.  Echocardiogram performed 02/17/2018 shows EF of 50 to 55% with anterolateral hypokinesia normal left atrial pressure moderate aortic stenosis peak and mean 34 and 20 mmHg valve area 1.2 cm.    He was last seen 04/01/2018. Compliance with diet, lifestyle and medications: *** Past Medical History:  Diagnosis Date  . Brain aneurysm   . COPD (chronic obstructive pulmonary disease) ([place])   . Diabetes ([place])   . High cholesterol   . Myocardial infarction ([place])   . Stroke ([place])   . TIA (transient ischemic attack)     Past Surgical History:  Procedure Laterality Date  . CORONARY ANGIOPLASTY WITH STENT PLACEMENT    . KNEE SURGERY      Current Medications: No outpatient medications have been marked as taking for the 04/20/19 encounter (Appointment) with Richardo Priest, MD.     Allergies:   Penicillins, Milk protein, and Milk-related compounds   Social History   Socioeconomic History  . Marital status: Single    Spouse name: Not on file  . Number of children: Not on file  . Years of education: Not on file  . Highest  education level: Not on file  Occupational History  . Not on file  Social Needs  . Financial resource strain: Not on file  . Food insecurity    Worry: Not on file    Inability: Not on file  . Transportation needs    Medical: Not on file    Non-medical: Not on file  Tobacco Use  . Smoking status: Current Every Day Smoker    Packs/day: 1.00  . Smokeless tobacco: Never Used  Substance and Sexual Activity  . Alcohol use: Not Currently  . Drug use: Never  . Sexual activity: Not on file  Lifestyle  . Physical activity    Days per week: Not on file    Minutes per session: Not on file  . Stress: Not on file  Relationships  . Social Herbalist on phone: Not on file    Gets together: Not on file    Attends religious service: Not on file    Active member of club or organization: Not on file    Attends meetings of clubs or organizations: Not on file    Relationship status: Not on file  Other Topics Concern  . Not on file  Social History Narrative  . Not on file     Family History: The patient's ***family history includes Breast cancer in his paternal  grandmother; Diabetes in his father; Heart attack in his father and paternal grandfather; Heart disease in his father; Hyperlipidemia in his father; Hypertension in his father; Liver cancer in his maternal grandfather. ROS:   Please see the history of present illness.    All other systems reviewed and are negative.  EKGs/Labs/Other Studies Reviewed:    The following studies were reviewed today:  EKG:  EKG ordered today and personally reviewed.  The ekg ordered today demonstrates ***  Recent Labs: No results found for requested labs within last 8760 hours.  Recent Lipid Panel    Component Value Date/Time   CHOL 107 04/01/2018 1619   TRIG 116 04/01/2018 1619   HDL 32 (L) 04/01/2018 1619   CHOLHDL 3.3 04/01/2018 1619   LDLCALC 52 04/01/2018 1619    Physical Exam:    VS:  There were no vitals taken for this  visit.    Wt Readings from Last 3 Encounters:  04/01/18 273 lb 9.6 oz (124.1 kg)  03/24/18 275 lb 3.2 oz (124.8 kg)  02/18/18 274 lb 9.6 oz (124.6 kg)     GEN: *** Well nourished, well developed in no acute distress HEENT: Normal NECK: No JVD; No carotid bruits LYMPHATICS: No lymphadenopathy CARDIAC: ***RRR, no murmurs, rubs, gallops RESPIRATORY:  Clear to auscultation without rales, wheezing or rhonchi  ABDOMEN: Soft, non-tender, non-distended MUSCULOSKELETAL:  No edema; No deformity  SKIN: Warm and dry NEUROLOGIC:  Alert and oriented x 3 PSYCHIATRIC:  Normal affect    Signed, Shirlee More, MD  04/19/2019 7:45 AM    [place]

## 2019-04-20 ENCOUNTER — Ambulatory Visit: Payer: Medicare Other | Admitting: Cardiology

## 2019-04-23 ENCOUNTER — Other Ambulatory Visit: Payer: Self-pay

## 2019-04-23 ENCOUNTER — Ambulatory Visit (INDEPENDENT_AMBULATORY_CARE_PROVIDER_SITE_OTHER): Payer: Medicare Other | Admitting: Sports Medicine

## 2019-04-23 ENCOUNTER — Encounter: Payer: Self-pay | Admitting: Sports Medicine

## 2019-04-23 ENCOUNTER — Ambulatory Visit (INDEPENDENT_AMBULATORY_CARE_PROVIDER_SITE_OTHER): Payer: Medicare Other

## 2019-04-23 DIAGNOSIS — L97521 Non-pressure chronic ulcer of other part of left foot limited to breakdown of skin: Secondary | ICD-10-CM

## 2019-04-23 DIAGNOSIS — M79675 Pain in left toe(s): Secondary | ICD-10-CM

## 2019-04-23 DIAGNOSIS — E1142 Type 2 diabetes mellitus with diabetic polyneuropathy: Secondary | ICD-10-CM

## 2019-04-23 NOTE — Progress Notes (Signed)
Subjective: Craig Rollins is a 63 y.o. male patient seen in office for follow up evaluation of ulceration of the left first and third toes and plantar foot. Patient has a history of diabetes and a blood glucose level today of 121.  Patient reports that his toes are doing good but the bottom sore is touchy.  Patient denies nausea vomiting fever night sweats shortness of breath or chills.  Patient has no other pedal complaints at this time.  FBS 121  Patient Active Problem List   Diagnosis Date Noted  . Chest discomfort 08/13/2018  . CHF (congestive heart failure) ([place]) 08/13/2018  . COPD (chronic obstructive pulmonary disease) ([place]) 08/13/2018  . Open wound 08/13/2018  . Shortness of breath 08/13/2018  . CAD in native artery 02/18/2018  . Aortic stenosis 02/18/2018  . Essential hypertension 02/18/2018  . Foot swelling 01/26/2018  . Diabetes mellitus, type 2 ([place]) 01/26/2018  . Diabetic peripheral neuropathy ([place]) 02/25/2017  . Hammer toes of both feet 02/25/2017  . Risk for falls 09/23/2016  . NSTEMI (non-ST elevated myocardial infarction) ([place]) 08/30/2016  . Wound eschar of foot 08/21/2016  . Dilated cardiomyopathy ([place]) 07/16/2016  . Dyslipidemia (high LDL; low HDL) 07/16/2016  . Noncompliance 07/16/2016  . Smoking 07/16/2016  . Stroke ([place]) 12/19/2015   Current Outpatient Medications on File Prior to Visit  Medication Sig Dispense Refill  . 24HR ALLERGY RELIEF 180 MG tablet Take 180 mg by mouth 2 (two) times daily.    Marland Kitchen aspirin EC 81 MG tablet Take 81 mg by mouth daily.    Marland Kitchen atorvastatin (LIPITOR) 40 MG tablet     . atorvastatin (LIPITOR) 80 MG tablet TAKE 1 TABLET BY MOUTH EVERY EVENING.    Marland Kitchen azithromycin (ZITHROMAX) 250 MG tablet     . carvedilol (COREG) 3.125 MG tablet Take 6.25 mg by mouth every 12 (twelve) hours.    . cefdinir (OMNICEF) 300 MG capsule     . clindamycin (CLEOCIN) 300 MG capsule Take 2 capsules (628m) 45 minutes prior to dental procedures 10  capsule 1  . clindamycin (CLEOCIN) 300 MG capsule Take 1 capsule (300 mg total) by mouth 3 (three) times daily. 42 capsule 0  . clopidogrel (PLAVIX) 75 MG tablet TAKE 1 TABLET BY MOUTH DAILY.    .Marland KitchenCOMBIVENT RESPIMAT 20-100 MCG/ACT AERS respimat     . doxycycline (MONODOX) 100 MG capsule TAKE 1 CAPSULE WITH FOOD TWICE DAILY UNTIL ALL GONE.    .Marland Kitchenezetimibe (ZETIA) 10 MG tablet Take 1 tablet (10 mg total) by mouth daily. 30 tablet 5  . fluocinonide ointment (LIDEX) 0.05 %     . glipiZIDE (GLUCOTROL) 5 MG tablet     . hydrOXYzine (ATARAX/VISTARIL) 25 MG tablet     . levofloxacin (LEVAQUIN) 500 MG tablet Take 1 tablet (500 mg total) by mouth daily. 14 tablet 0  . lisinopril (PRINIVIL,ZESTRIL) 5 MG tablet Take 5 mg by mouth daily.    . metFORMIN (GLUCOPHAGE) 1000 MG tablet TAKE ONE TABLET BY MOUTH TWICE DAILY    . metFORMIN (GLUCOPHAGE) 500 MG tablet TAKE 1 TABLET BY MOUTH EVERY TWELVE HOURS    . nitroGLYCERIN (NITROSTAT) 0.4 MG SL tablet PLACE 1 TABLET (0.4 MG TOTAL) UNDER THE TONGUE EVERY FIVE (5) MINUTES AS NEEDED FOR CHEST PAIN.    .Marland KitchenoxyCODONE (OXY IR/ROXICODONE) 5 MG immediate release tablet     . predniSONE (DELTASONE) 20 MG tablet     . sulfamethoxazole-trimethoprim (BACTRIM) 400-80 MG tablet Take 1 tablet by  mouth 2 (two) times daily. 28 tablet 0  . tamsulosin (FLOMAX) 0.4 MG CAPS capsule TAKE 1 CAPSULE DAILY, 30 MINUTES AFTER SUPPER.    Marland Kitchen topiramate (TOPAMAX) 50 MG tablet Take 1 tablet (50 mg total) by mouth daily. 30 tablet 3  . triamcinolone cream (KENALOG) 0.1 % Apply 1 application topically 2 (two) times daily.     No current facility-administered medications on file prior to visit.    Allergies  Allergen Reactions  . Penicillins Nausea And Vomiting  . Milk Protein Itching and Hives  . Milk-Related Compounds Hives and Itching    No results found for this or any previous visit (from the past 2160 hour(s)).  Objective: There were no vitals filed for this visit.  General:  Patient is awake, alert, oriented x 3 and in no acute distress.  Dermatology: Skin is warm and dry bilateral with a partial thickness ulceration present Distal tuft of the left 3rd toe. Ulceration measures less than 0.2 cm.  There is a Erythematous border with a fibrogranular base. The ulceration does not probe to bone. There is minimal malodor, decreased clear active drainage, focal digital edema.   Sub met 5 on left ulceration measures 1x1.5 cm with fibrous base and increased pain to touch on left.   No other acute signs of infection.   Vascular: Pedal pulses are difficult to palpate due 1+ swelling.  Neurologic: Protective sensation absent bilateral.  Musculosketal: There is minimal pain with palpation to ulcerated areas on the left third toe but increased pain sub met 5 on left. No pain with compression to calves bilateral.  Patient is status post right fourth toe amputation which remains healed.  Xray Left foot: Diffuse arthritis. No bony destruction plantar 5th met head, No soft tissue ephysema or gas in tissues.  No results for input(s): GRAMSTAIN, LABORGA in the last 8760 hours.  Assessment and Plan:  Problem List Items Addressed This Visit      Endocrine   Diabetic peripheral neuropathy ([place])    Other Visit Diagnoses    Toe ulcer, left, limited to breakdown of skin ([place])    -  Primary   Toe pain, left         -Examined patient and discussed the progression of the wounds and treatment alternatives. -Cleansed ulceration at toe and sub met 5 on left - Excisionally dedbrided ulceration at sub met 5 and 3rd toe on left to healthy bleeding borders removing nonviable tissue using a sterile chisel blade. Wound measures post debridement as above. Wound was debrided to the level of the dermis with viable wound base exposed to promote healing. Hemostasis was achieved with manuel pressure. Patient tolerated procedure well without any discomfort or anesthesia necessary for this wound  debridement.  -Wound culture obtained sub met 5 on left and will call patient if he needs more antibiotics -Applied Iodosorb and dry dressing as I did on todays visit -Continue with clindamycin for patient to take as instructed - Advised patient to go to the ER or return to office if the wound worsens or if constitutional symptoms are present. -Advised post op shoe but patient is in tennis shoe at today's visit like before -Patient to return to office in 2 to 3 weeks for follow up care and evaluation or sooner if problems arise.  [name], DPM

## 2019-04-26 DIAGNOSIS — N401 Enlarged prostate with lower urinary tract symptoms: Secondary | ICD-10-CM | POA: Diagnosis not present

## 2019-04-26 DIAGNOSIS — R972 Elevated prostate specific antigen [PSA]: Secondary | ICD-10-CM | POA: Diagnosis not present

## 2019-04-28 ENCOUNTER — Telehealth: Payer: Self-pay

## 2019-04-28 ENCOUNTER — Other Ambulatory Visit: Payer: Self-pay | Admitting: Sports Medicine

## 2019-04-28 LAB — WOUND CULTURE

## 2019-04-28 MED ORDER — LEVOFLOXACIN 500 MG PO TABS: 500.0000 mg | ORAL_TABLET | Freq: Every day | ORAL | 0 refills | Status: DC

## 2019-04-28 NOTE — Telephone Encounter (Signed)
Called and spoke with patient about wound cx results and the Dr's prescription instructions. Pt stated understanding.

## 2019-04-28 NOTE — Progress Notes (Signed)
Sent levaquin for + culture

## 2019-04-28 NOTE — Telephone Encounter (Signed)
-----   Message from [name], [place] sent at 04/28/2019 12:13 PM EST ----- Sent Levaquin to pharmacy for patient to take for + culture

## 2019-04-30 DIAGNOSIS — I2510 Atherosclerotic heart disease of native coronary artery without angina pectoris: Secondary | ICD-10-CM | POA: Diagnosis not present

## 2019-04-30 DIAGNOSIS — E0800 Diabetes mellitus due to underlying condition with hyperosmolarity without nonketotic hyperglycemic-hyperosmolar coma (NKHHC): Secondary | ICD-10-CM | POA: Diagnosis not present

## 2019-04-30 DIAGNOSIS — I200 Unstable angina: Secondary | ICD-10-CM | POA: Diagnosis not present

## 2019-04-30 DIAGNOSIS — I5022 Chronic systolic (congestive) heart failure: Secondary | ICD-10-CM | POA: Diagnosis not present

## 2019-05-12 ENCOUNTER — Other Ambulatory Visit: Payer: Self-pay

## 2019-05-12 ENCOUNTER — Encounter: Payer: Self-pay | Admitting: Sports Medicine

## 2019-05-12 ENCOUNTER — Ambulatory Visit (INDEPENDENT_AMBULATORY_CARE_PROVIDER_SITE_OTHER): Payer: Medicare Other | Admitting: Sports Medicine

## 2019-05-12 DIAGNOSIS — M79672 Pain in left foot: Secondary | ICD-10-CM | POA: Diagnosis not present

## 2019-05-12 DIAGNOSIS — E1142 Type 2 diabetes mellitus with diabetic polyneuropathy: Secondary | ICD-10-CM | POA: Diagnosis not present

## 2019-05-12 DIAGNOSIS — L97521 Non-pressure chronic ulcer of other part of left foot limited to breakdown of skin: Secondary | ICD-10-CM | POA: Diagnosis not present

## 2019-05-12 MED ORDER — TRAMADOL HCL 50 MG PO TABS: 50.0000 mg | ORAL_TABLET | Freq: Three times a day (TID) | ORAL | 0 refills | Status: AC | PRN

## 2019-05-12 MED ORDER — SULFAMETHOXAZOLE-TRIMETHOPRIM 400-80 MG PO TABS: 1.0000 | ORAL_TABLET | Freq: Two times a day (BID) | ORAL | 0 refills | Status: DC

## 2019-05-12 NOTE — Progress Notes (Signed)
Subjective: Craig Rollins is a 63 y.o. male patient seen in office for follow up evaluation of ulceration of the left first and third toes and plantar foot.  Patient reports that he is in a lot of pain at the bottom of the left foot at the wound and reports that he hasn't been able to dress area daily but did see and increase in clear to bloody drainage.  Patient denies nausea vomiting fever night sweats shortness of breath or chills.  Patient has no other pedal complaints at this time.  FBS 121 same as before  Patient Active Problem List   Diagnosis Date Noted  . Chest discomfort 08/13/2018  . CHF (congestive heart failure) ([place]) 08/13/2018  . COPD (chronic obstructive pulmonary disease) ([place]) 08/13/2018  . Open wound 08/13/2018  . Shortness of breath 08/13/2018  . CAD in native artery 02/18/2018  . Aortic stenosis 02/18/2018  . Essential hypertension 02/18/2018  . Foot swelling 01/26/2018  . Diabetes mellitus, type 2 ([place]) 01/26/2018  . Diabetic peripheral neuropathy ([place]) 02/25/2017  . Hammer toes of both feet 02/25/2017  . Risk for falls 09/23/2016  . NSTEMI (non-ST elevated myocardial infarction) ([place]) 08/30/2016  . Wound eschar of foot 08/21/2016  . Dilated cardiomyopathy ([place]) 07/16/2016  . Dyslipidemia (high LDL; low HDL) 07/16/2016  . Noncompliance 07/16/2016  . Smoking 07/16/2016  . Stroke ([place]) 12/19/2015   Current Outpatient Medications on File Prior to Visit  Medication Sig Dispense Refill  . 24HR ALLERGY RELIEF 180 MG tablet Take 180 mg by mouth 2 (two) times daily.    Marland Kitchen aspirin EC 81 MG tablet Take 81 mg by mouth daily.    Marland Kitchen atorvastatin (LIPITOR) 40 MG tablet     . atorvastatin (LIPITOR) 80 MG tablet TAKE 1 TABLET BY MOUTH EVERY EVENING.    Marland Kitchen azithromycin (ZITHROMAX) 250 MG tablet     . carvedilol (COREG) 3.125 MG tablet Take 6.25 mg by mouth every 12 (twelve) hours.    . cefdinir (OMNICEF) 300 MG capsule     . clindamycin (CLEOCIN) 300 MG capsule Take 2  capsules ('600mg'$ ) 45 minutes prior to dental procedures 10 capsule 1  . clindamycin (CLEOCIN) 300 MG capsule Take 1 capsule (300 mg total) by mouth 3 (three) times daily. 42 capsule 0  . clopidogrel (PLAVIX) 75 MG tablet TAKE 1 TABLET BY MOUTH DAILY.    Marland Kitchen COMBIVENT RESPIMAT 20-100 MCG/ACT AERS respimat     . doxycycline (MONODOX) 100 MG capsule TAKE 1 CAPSULE WITH FOOD TWICE DAILY UNTIL ALL GONE.    Marland Kitchen ezetimibe (ZETIA) 10 MG tablet Take 1 tablet (10 mg total) by mouth daily. 30 tablet 5  . fluocinonide ointment (LIDEX) 0.05 %     . glipiZIDE (GLUCOTROL) 5 MG tablet     . hydrOXYzine (ATARAX/VISTARIL) 25 MG tablet     . levofloxacin (LEVAQUIN) 500 MG tablet Take 1 tablet (500 mg total) by mouth daily. 7 tablet 0  . lisinopril (PRINIVIL,ZESTRIL) 5 MG tablet Take 5 mg by mouth daily.    . metFORMIN (GLUCOPHAGE) 1000 MG tablet TAKE ONE TABLET BY MOUTH TWICE DAILY    . metFORMIN (GLUCOPHAGE) 500 MG tablet TAKE 1 TABLET BY MOUTH EVERY TWELVE HOURS    . nitroGLYCERIN (NITROSTAT) 0.4 MG SL tablet PLACE 1 TABLET (0.4 MG TOTAL) UNDER THE TONGUE EVERY FIVE (5) MINUTES AS NEEDED FOR CHEST PAIN.    Marland Kitchen oxyCODONE (OXY IR/ROXICODONE) 5 MG immediate release tablet     . predniSONE (DELTASONE) 20  MG tablet     . tamsulosin (FLOMAX) 0.4 MG CAPS capsule TAKE 1 CAPSULE DAILY, 30 MINUTES AFTER SUPPER.    Marland Kitchen topiramate (TOPAMAX) 50 MG tablet Take 1 tablet (50 mg total) by mouth daily. 30 tablet 3  . triamcinolone cream (KENALOG) 0.1 % Apply 1 application topically 2 (two) times daily.     No current facility-administered medications on file prior to visit.   Allergies  Allergen Reactions  . Penicillins Nausea And Vomiting  . Milk Protein Itching and Hives  . Milk-Related Compounds Hives and Itching    Recent Results (from the past 2160 hour(s))  WOUND CULTURE     Status: Abnormal   Collection Time: 04/23/19  1:08 PM   Specimen: Foot, Left; Wound   WOUND CULTURE AND SENS  Result Value Ref Range   Gram Stain  Result Final report    Organism ID, Bacteria Comment     Comment: Rare white blood cells.   Organism ID, Bacteria Comment     Comment: Many gram negative rods.   Organism ID, Bacteria Comment     Comment: Moderate number of gram positive cocci.   Organism ID, Bacteria Comment     Comment: Few gram positive rods.   Aerobic Bacterial Culture Final report (A)    Organism ID, Bacteria Alcaligenes faecalis (A)     Comment: Heavy growth   Organism ID, Bacteria Comment (A)     Comment: Achromobacter xylosoxidans Heavy growth    Organism ID, Bacteria Comment (A)     Comment: Beta hemolytic Streptococcus, group B Heavy growth Penicillin and ampicillin are drugs of choice for treatment of beta-hemolytic streptococcal infections. Susceptibility testing of penicillins and other beta-lactam agents approved by the FDA for treatment of beta-hemolytic streptococcal infections need not be performed routinely because nonsusceptible isolates are extremely rare in any beta-hemolytic streptococcus and have not been reported for Streptococcus pyogenes (group A). (CLSI)    Organism ID, Bacteria Routine flora     Comment: Heavy growth   Antimicrobial Susceptibility Comment     Comment:       ** S = Susceptible; I = Intermediate; R = Resistant **                    P = Positive; N = Negative             MICS are expressed in micrograms per mL    Antibiotic                 RSLT#1    RSLT#2    RSLT#3    RSLT#4 Amikacin                       R         R Aztreonam                      R         R Cefepime                       I         R Cefotaxime                     R         R Ceftazidime                    R  R Ceftriaxone                    R         R Ciprofloxacin                  R         I Gentamicin                     R         R Imipenem                       S         S Levofloxacin                   I         S Meropenem                      S         S Piperacillin/Tazobactam         S         S Tetracycline                   R         R Ticarcillin/Clavulanate        I         R Tobramycin                     R         R Trimethoprim/Sulfa             R         S     Objective: There were no vitals filed for this visit.  General: Patient is awake, alert, oriented x 3 and in no acute distress.  Dermatology: Skin is warm and dry bilateral with healed toe ulcer at left 3rd.   Partial thickness Sub met 5 on left ulceration measures 3x2cm cm with eschar base and increased pain to touch on left.   No other acute signs of infection.   Vascular: Pedal pulses are difficult to palpate due 1+ swelling.  Neurologic: Protective sensation absent bilateral.  Musculosketal: There is pain with palpation to ulcerated at sub met 5 on left. No pain with compression to calves bilateral.  Patient is status post right fourth toe amputation which remains healed.  No results for input(s): GRAMSTAIN, LABORGA in the last 8760 hours.  Assessment and Plan:  Problem List Items Addressed This Visit      Endocrine   Diabetic peripheral neuropathy ([place])    Other Visit Diagnoses    Foot ulcer, left, limited to breakdown of skin ([place])    -  Primary   Left foot pain         -Examined patient and discussed the progression of the wounds and treatment alternatives. -Cleansed ulceration at sub met 5 on left - Excisionally dedbrided ulceration at sub met 5 on left to healthy bleeding borders removing nonviable tissue using a sterile chisel blade. Wound measures post debridement as above. Wound was debrided to the level of the dermis with viable wound base exposed to promote healing. Hemostasis was achieved with manuel pressure. Patient tolerated procedure well without any discomfort or anesthesia necessary for this wound debridement.  -Applied medihoney and dry dressing as I did on todays visit -RX Bactrim  for increase clear drainage from wound  -RX Tramadol for pain  - Advised patient  to go to the ER or return to office if the wound worsens or if constitutional symptoms are present. -Advised post op shoe but patient is in tennis shoe at today's visit like before -Patient to return to office in 2 to 3 weeks for follow up care and evaluation or sooner if problems arise.  [name], DPM

## 2019-05-31 ENCOUNTER — Other Ambulatory Visit: Payer: Self-pay | Admitting: *Deleted

## 2019-05-31 DIAGNOSIS — I10 Essential (primary) hypertension: Secondary | ICD-10-CM

## 2019-05-31 NOTE — Patient Outreach (Addendum)
Per Patient Craig Rollins member has had multiple ED visits/hospitalizations. Member has had 6 ED visits/hospitalizations within the past 6 months. Most of his encounters were at [facility].  Member screened for potential [place] Management needs as a benefit of [place] [employer].   Telephone call made to Craig Rollins at [phone]. Patient identifiers confirmed. Discussed [facility] Care Management services for disease management and education. Craig Rollins is agreeable. Denies need for transportation, meal, or pharmacy assistance.   Confirmed Primary Care Provider is [name], NP with [facility].   Craig Rollins has a medial history of CHF, COPD, DM, HTN, peripheral neuropathy, NSTEMI, CVA.   Will make referral to [place] for complex care management.   Marthenia Rolling, MSN-Ed, RN,BSN [place] Acute Care Coordinator [phone] [facility]) [phone]  (Toll free office)

## 2019-06-02 ENCOUNTER — Other Ambulatory Visit: Payer: Self-pay

## 2019-06-02 ENCOUNTER — Ambulatory Visit (INDEPENDENT_AMBULATORY_CARE_PROVIDER_SITE_OTHER): Payer: Medicare Other | Admitting: Sports Medicine

## 2019-06-02 ENCOUNTER — Ambulatory Visit (INDEPENDENT_AMBULATORY_CARE_PROVIDER_SITE_OTHER): Payer: Medicare Other

## 2019-06-02 ENCOUNTER — Encounter: Payer: Self-pay | Admitting: Sports Medicine

## 2019-06-02 DIAGNOSIS — M79672 Pain in left foot: Secondary | ICD-10-CM

## 2019-06-02 DIAGNOSIS — L97511 Non-pressure chronic ulcer of other part of right foot limited to breakdown of skin: Secondary | ICD-10-CM | POA: Diagnosis not present

## 2019-06-02 DIAGNOSIS — S90811A Abrasion, right foot, initial encounter: Secondary | ICD-10-CM

## 2019-06-02 DIAGNOSIS — E1142 Type 2 diabetes mellitus with diabetic polyneuropathy: Secondary | ICD-10-CM

## 2019-06-02 DIAGNOSIS — L97521 Non-pressure chronic ulcer of other part of left foot limited to breakdown of skin: Secondary | ICD-10-CM

## 2019-06-02 DIAGNOSIS — M79671 Pain in right foot: Secondary | ICD-10-CM

## 2019-06-02 NOTE — Progress Notes (Signed)
Subjective: Craig Rollins is a 64 y.o. male patient seen in office for follow up evaluation of ulceration of the left  plantar foot and a new pain in the right arch.  Patient reports that the left foot is doing good there has been some more dry scabbing that he has noticed that has come off this morning with less drainage and better with the redness and swelling but does admit on the right foot that is very sore and noticed a small cut to the area.Patient denies nausea vomiting fever night sweats shortness of breath or chills.  Patient has no other pedal complaints at this time.  FBS 129  Patient Active Problem List   Diagnosis Date Noted  . Chest discomfort 08/13/2018  . CHF (congestive heart failure) ([place]) 08/13/2018  . COPD (chronic obstructive pulmonary disease) ([place]) 08/13/2018  . Open wound 08/13/2018  . Shortness of breath 08/13/2018  . CAD in native artery 02/18/2018  . Aortic stenosis 02/18/2018  . Essential hypertension 02/18/2018  . Foot swelling 01/26/2018  . Diabetes mellitus, type 2 ([place]) 01/26/2018  . Diabetic peripheral neuropathy ([place]) 02/25/2017  . Hammer toes of both feet 02/25/2017  . Risk for falls 09/23/2016  . NSTEMI (non-ST elevated myocardial infarction) ([place]) 08/30/2016  . Wound eschar of foot 08/21/2016  . Dilated cardiomyopathy ([place]) 07/16/2016  . Dyslipidemia (high LDL; low HDL) 07/16/2016  . Noncompliance 07/16/2016  . Smoking 07/16/2016  . Stroke ([place]) 12/19/2015   Current Outpatient Medications on File Prior to Visit  Medication Sig Dispense Refill  . albuterol (VENTOLIN HFA) 108 (90 Base) MCG/ACT inhaler Inhale into the lungs.    Marland Kitchen 24HR ALLERGY RELIEF 180 MG tablet Take 180 mg by mouth 2 (two) times daily.    Marland Kitchen apixaban (ELIQUIS) 5 MG TABS tablet Take by mouth.    Marland Kitchen aspirin EC 81 MG tablet Take 81 mg by mouth daily.    Marland Kitchen atorvastatin (LIPITOR) 40 MG tablet     . atorvastatin (LIPITOR) 80 MG tablet TAKE 1 TABLET BY MOUTH EVERY EVENING.     Marland Kitchen azithromycin (ZITHROMAX) 250 MG tablet     . carvedilol (COREG) 3.125 MG tablet Take 6.25 mg by mouth every 12 (twelve) hours.    . cefdinir (OMNICEF) 300 MG capsule     . clindamycin (CLEOCIN) 300 MG capsule Take 2 capsules (638m) 45 minutes prior to dental procedures 10 capsule 1  . clindamycin (CLEOCIN) 300 MG capsule Take 1 capsule (300 mg total) by mouth 3 (three) times daily. 42 capsule 0  . clopidogrel (PLAVIX) 75 MG tablet TAKE 1 TABLET BY MOUTH DAILY.    .Marland KitchenCOMBIVENT RESPIMAT 20-100 MCG/ACT AERS respimat     . doxycycline (MONODOX) 100 MG capsule TAKE 1 CAPSULE WITH FOOD TWICE DAILY UNTIL ALL GONE.    .Marland Kitchenezetimibe (ZETIA) 10 MG tablet Take 1 tablet (10 mg total) by mouth daily. 30 tablet 5  . fluocinonide ointment (LIDEX) 0.05 %     . glipiZIDE (GLUCOTROL) 5 MG tablet     . hydrOXYzine (ATARAX/VISTARIL) 25 MG tablet     . levofloxacin (LEVAQUIN) 500 MG tablet Take 1 tablet (500 mg total) by mouth daily. 7 tablet 0  . lisinopril (PRINIVIL,ZESTRIL) 5 MG tablet Take 5 mg by mouth daily.    . metFORMIN (GLUCOPHAGE) 1000 MG tablet TAKE ONE TABLET BY MOUTH TWICE DAILY    . metFORMIN (GLUCOPHAGE) 500 MG tablet TAKE 1 TABLET BY MOUTH EVERY TWELVE HOURS    . nitroGLYCERIN (NITROSTAT)  0.4 MG SL tablet PLACE 1 TABLET (0.4 MG TOTAL) UNDER THE TONGUE EVERY FIVE (5) MINUTES AS NEEDED FOR CHEST PAIN.    Marland Kitchen oxyCODONE (OXY IR/ROXICODONE) 5 MG immediate release tablet     . predniSONE (DELTASONE) 20 MG tablet     . sulfamethoxazole-trimethoprim (BACTRIM) 400-80 MG tablet Take 1 tablet by mouth 2 (two) times daily. 28 tablet 0  . tamsulosin (FLOMAX) 0.4 MG CAPS capsule TAKE 1 CAPSULE DAILY, 30 MINUTES AFTER SUPPER.    Marland Kitchen topiramate (TOPAMAX) 50 MG tablet Take 1 tablet (50 mg total) by mouth daily. 30 tablet 3  . triamcinolone cream (KENALOG) 0.1 % Apply 1 application topically 2 (two) times daily.     No current facility-administered medications on file prior to visit.   Allergies  Allergen  Reactions  . Penicillins Nausea And Vomiting  . Milk Protein Itching and Hives  . Milk-Related Compounds Hives and Itching    Recent Results (from the past 2160 hour(s))  WOUND CULTURE     Status: Abnormal   Collection Time: 04/23/19  1:08 PM   Specimen: Foot, Left; Wound   WOUND CULTURE AND SENS  Result Value Ref Range   Gram Stain Result Final report    Organism ID, Bacteria Comment     Comment: Rare white blood cells.   Organism ID, Bacteria Comment     Comment: Many gram negative rods.   Organism ID, Bacteria Comment     Comment: Moderate number of gram positive cocci.   Organism ID, Bacteria Comment     Comment: Few gram positive rods.   Aerobic Bacterial Culture Final report (A)    Organism ID, Bacteria Alcaligenes faecalis (A)     Comment: Heavy growth   Organism ID, Bacteria Comment (A)     Comment: Achromobacter xylosoxidans Heavy growth    Organism ID, Bacteria Comment (A)     Comment: Beta hemolytic Streptococcus, group B Heavy growth Penicillin and ampicillin are drugs of choice for treatment of beta-hemolytic streptococcal infections. Susceptibility testing of penicillins and other beta-lactam agents approved by the FDA for treatment of beta-hemolytic streptococcal infections need not be performed routinely because nonsusceptible isolates are extremely rare in any beta-hemolytic streptococcus and have not been reported for Streptococcus pyogenes (group A). (CLSI)    Organism ID, Bacteria Routine flora     Comment: Heavy growth   Antimicrobial Susceptibility Comment     Comment:       ** S = Susceptible; I = Intermediate; R = Resistant **                    P = Positive; N = Negative             MICS are expressed in micrograms per mL    Antibiotic                 RSLT#1    RSLT#2    RSLT#3    RSLT#4 Amikacin                       R         R Aztreonam                      R         R Cefepime                       I  R Cefotaxime                      R         R Ceftazidime                    R         R Ceftriaxone                    R         R Ciprofloxacin                  R         I Gentamicin                     R         R Imipenem                       S         S Levofloxacin                   I         S Meropenem                      S         S Piperacillin/Tazobactam        S         S Tetracycline                   R         R Ticarcillin/Clavulanate        I         R Tobramycin                     R         R Trimethoprim/Sulfa             R         S     Objective: There were no vitals filed for this visit.  General: Patient is awake, alert, oriented x 3 and in no acute distress.  Dermatology: Skin is warm and dry bilateral with significant dryness to lower extremities  Partial thickness Sub met 5 on left ulceration measures 2.5x2cm cm with eschar base that appears to be drying and scabbing with minimal pain to palpation  There is a small skin abrasion noted to the plantar arch and submet 5 on right with no signs of infection.  No other acute signs of infection.   Vascular: Pedal pulses are difficult to palpate due 1+ swelling.  Neurologic: Protective sensation absent bilateral.  Musculosketal: There is pain with palpation to ulcerated at sub met 5 on left and to abrasion on right. No pain with compression to calves bilateral.  Patient is status post right fourth toe amputation which remains healed.  X-rays right foot no acute osseous findings  No results for input(s): GRAMSTAIN, LABORGA in the last 8760 hours.  Assessment and Plan:  Problem List Items Addressed This Visit      Endocrine   Diabetic peripheral neuropathy ([place])    Other Visit Diagnoses    Right foot pain    -  Primary   Relevant Orders   DG Foot Complete Right   Foot ulcer, left, limited to breakdown of skin (  [place])       Left foot pain       Arch pain of right foot       Abrasion, right foot, initial encounter          -Examined patient and discussed the progression of the wounds and treatment alternatives. -Cleansed ulceration at sub met 5 on left - Excisionally dedbrided ulceration at sub met 5 on left to healthy bleeding borders removing nonviable tissue using a sterile chisel blade. Wound measures post debridement as above. Wound was debrided to the level of the dermis with viable wound base exposed to promote healing. Hemostasis was achieved with manuel pressure. Patient tolerated procedure well without any discomfort or anesthesia necessary for this wound debridement.  -Applied medihoney and dry dressing as I did on todays visit and cover with clean sock -Applied antibiotic cream to abrasions on right and advised patient to do the same daily -Gave patient sample of foot miracle cream to use for dry skin on legs -Continue with tramadol as needed for pain -No additional antibiotics are needed at this time since wounds are improving - Advised patient to go to the ER or return to office if the wound worsens or if constitutional symptoms are present. -Patient to return to office in 3-4 weeks for follow up care and evaluation or sooner if problems arise.  [name], DPM

## 2019-06-09 ENCOUNTER — Other Ambulatory Visit: Payer: Self-pay | Admitting: Sports Medicine

## 2019-06-09 DIAGNOSIS — L97511 Non-pressure chronic ulcer of other part of right foot limited to breakdown of skin: Secondary | ICD-10-CM

## 2019-06-10 ENCOUNTER — Other Ambulatory Visit: Payer: Self-pay

## 2019-06-10 NOTE — Patient Outreach (Addendum)
Craig Rollins [facility]) Care Management  06/10/2019   Craig Rollins [birth year] [id number]  Initial outreach to patient. Explained [facility] program. Subjective:  Patient reports that he has had 6 admissions in the last 6 months.  MI, CVA, uncontrolled bleeding from skin tear. PE , angioplasty.  Patient reports that he wound on his left foot which he is having treatment for. Reports that he monitors his CBG twice daily. Range of 90-250.  Reports he follows his diet. Does not eat bread, pasta, potatoes.  Reports he drives himself to his appointments.  States that he lives alone and does no have any family support. Continues to smoke.     Current Medications:  Current Outpatient Medications  Medication Sig Dispense Refill  . albuterol (VENTOLIN HFA) 108 (90 Base) MCG/ACT inhaler Inhale into the lungs.    Marland Kitchen apixaban (ELIQUIS) 5 MG TABS tablet Take by mouth 2 (two) times daily.     Marland Kitchen aspirin EC 81 MG tablet Take 81 mg by mouth daily.    Marland Kitchen atorvastatin (LIPITOR) 40 MG tablet daily.     . carvedilol (COREG) 3.125 MG tablet Take 6.25 mg by mouth every 12 (twelve) hours.    . diphenhydrAMINE (BENADRYL) 25 MG tablet Take 25 mg by mouth every 6 (six) hours as needed.    Marland Kitchen glipiZIDE (GLUCOTROL) 5 MG tablet 2.5 mg 2 (two) times daily before a meal.     . metFORMIN (GLUCOPHAGE) 500 MG tablet TAKE 1 TABLET BY MOUTH EVERY TWELVE HOURS    . topiramate (TOPAMAX) 50 MG tablet Take 1 tablet (50 mg total) by mouth daily. 30 tablet 3  . nitroGLYCERIN (NITROSTAT) 0.4 MG SL tablet PLACE 1 TABLET (0.4 MG TOTAL) UNDER THE TONGUE EVERY FIVE (5) MINUTES AS NEEDED FOR CHEST PAIN.    Marland Kitchen triamcinolone cream (KENALOG) 0.1 % Apply 1 application topically 2 (two) times daily.     No current facility-administered medications for this visit.    Functional Status:  In your present state of health, do you have any difficulty performing the following activities: 06/10/2019  Hearing? N  Vision? N  Comment wears  reading glasses  Difficulty concentrating or making decisions? N  Walking or climbing stairs? Y  Comment reports altered balance  Dressing or bathing? N  Doing errands, shopping? N  Preparing Food and eating ? N  Using the Toilet? N  In the past six months, have you accidently leaked urine? N  Do you have problems with loss of bowel control? N  Managing your Medications? N  Managing your Finances? N  Housekeeping or managing your Housekeeping? N  Some recent data might be hidden    Fall/Depression Screening: Fall Risk  06/10/2019 03/24/2018  Falls in the past year? 1 1  Number falls in past yr: 1 0  Injury with Fall? 0 0  Risk for fall due to : History of fall(s);Impaired balance/gait -  Follow up Falls evaluation completed;Education provided;Falls prevention discussed -   PHQ 2/9 Scores 06/10/2019  PHQ - 2 Score 0    Assessment:  (1) introduced [facility] program. Patient agrees to services.  (2)DM: A1c of 7.6 per self report. Monitors CBG twice a day. Reports taking all medications as prescribed. (3) CHF: reports does not weigh and has not been told to do so. (4) wound on foot (5) frequent ED visit and admission (6) right sided weakness and fall risk  Plan:  (1) will mail [facility] packet and consent for to patient. Will include [facility]  calendar. Reviewed with patient to monitor CBG and record in [facility] calendar.  (2) Reviewed low card diet. Reviewed importance of good DM control. (3) Will inquire if patient needs to weigh. If so will inquire about securing a scale. (4) Reviewed signs and symptoms of infection an reviewed early recognition of symptoms.  (5) Encouraged patient to call MD for onset or change in symptoms.  (6) review fall precautions with patient.  [facility] CM Care Plan Problem One     Most Recent Value  Care Plan Problem One  Patient with frequent visits to the hospital and emergency department.  Role Documenting the Problem One  Care Management [place] for Problem  One  Active  [facility] Long Term Goal   Patient will report no ED visits in the next 60 days.  [facility] Long Term Goal Start Date  06/10/19  Interventions for Problem One Long Term Goal  Reviewed with patient when to call MD. Reviewed life threatening reasons to call 911.   [facility] CM Short Term Goal #1   Patient will report any signs of infection to MD in the next 30 days,   [facility] CM Short Term Goal #1 Start Date  06/10/19  Interventions for Short Term Goal #1  Reviewed with paitent signs of infection and when to call MD. Reviewed redness, swelling, fever and increased swelling.   [facility] CM Short Term Goal #2   Patient will report no falls in the next  30 days.   [facility] CM Short Term Goal #2 Start Date  06/10/19  Interventions for Short Term Goal #2  Reviewed fall precautions with patient. Encouraged use of assistive devices if needed when balance is worse. Encouraged paitent to use extreme caution since he is on blood thinners.        PLAN: will send this note to MD. Will mail successful outreach letter to patient and [facility] new patient packet. Will send barrier letter to MD.  Will plan follow up with patient in 10 days.   Tomasa Rand, RN, BSN, CEN [facility] [employer] [phone]

## 2019-06-23 ENCOUNTER — Other Ambulatory Visit: Payer: Self-pay

## 2019-06-23 NOTE — Patient Outreach (Signed)
Telephone assessmnet:  Placed call to patient who answered and reports that he is feeling good. Reports CBG today of 127.  Reports pain 7 /10 in his foot. But reports this is less than usual.  Reports foot appears to be the same.  Denies any worsening condition. Patient has follow up planned for 2 weeks from now.  PLAN: Will mail an advanced directive packet and contact patient again in 10 days. Reviewed with patient to call MD for any changes in condition.  I have sent a message to primary MD to find out if patient needs to be weighing daily. Waiting for a response.   Tomasa Rand, RN, BSN, CEN [facility] [employer] [phone]

## 2019-06-30 ENCOUNTER — Encounter: Payer: Self-pay | Admitting: Sports Medicine

## 2019-06-30 ENCOUNTER — Ambulatory Visit (INDEPENDENT_AMBULATORY_CARE_PROVIDER_SITE_OTHER): Payer: Medicare Other | Admitting: Sports Medicine

## 2019-06-30 ENCOUNTER — Other Ambulatory Visit: Payer: Self-pay

## 2019-06-30 DIAGNOSIS — L02619 Cutaneous abscess of unspecified foot: Secondary | ICD-10-CM | POA: Diagnosis not present

## 2019-06-30 DIAGNOSIS — M79672 Pain in left foot: Secondary | ICD-10-CM | POA: Diagnosis not present

## 2019-06-30 DIAGNOSIS — L97522 Non-pressure chronic ulcer of other part of left foot with fat layer exposed: Secondary | ICD-10-CM

## 2019-06-30 DIAGNOSIS — L03119 Cellulitis of unspecified part of limb: Secondary | ICD-10-CM

## 2019-06-30 DIAGNOSIS — E1142 Type 2 diabetes mellitus with diabetic polyneuropathy: Secondary | ICD-10-CM | POA: Diagnosis not present

## 2019-06-30 DIAGNOSIS — L97521 Non-pressure chronic ulcer of other part of left foot limited to breakdown of skin: Secondary | ICD-10-CM | POA: Diagnosis not present

## 2019-06-30 MED ORDER — CIPROFLOXACIN HCL 500 MG PO TABS: 500.0000 mg | ORAL_TABLET | Freq: Two times a day (BID) | ORAL | 0 refills | Status: DC

## 2019-06-30 NOTE — Progress Notes (Signed)
Subjective: Craig Rollins is a 64 y.o. male patient seen in office for follow up evaluation of ulceration of the left  plantar foot.  Patient reports that it seems like the ulcer has gotten very tender and is draining more since last time pain is worse at night have been trying to keep Medihoney on it but it is difficult to reach foot so has not been dressing it properly and has been trying to wear a clean sock but has noticed that there has been a recent drainage.  Patient reports that now he has a stable home and is no longer living in his car.  Patient denies nausea vomiting fever chills or any other constitutional symptoms at this time.  FBS 214 and last A1c 7.4  Patient Active Problem List   Diagnosis Date Noted  . Chest discomfort 08/13/2018  . CHF (congestive heart failure) ([place]) 08/13/2018  . COPD (chronic obstructive pulmonary disease) ([place]) 08/13/2018  . Open wound 08/13/2018  . Shortness of breath 08/13/2018  . CAD in native artery 02/18/2018  . Aortic stenosis 02/18/2018  . Essential hypertension 02/18/2018  . Foot swelling 01/26/2018  . Diabetes mellitus, type 2 ([place]) 01/26/2018  . Diabetic peripheral neuropathy ([place]) 02/25/2017  . Hammer toes of both feet 02/25/2017  . Risk for falls 09/23/2016  . NSTEMI (non-ST elevated myocardial infarction) ([place]) 08/30/2016  . Wound eschar of foot 08/21/2016  . Dilated cardiomyopathy ([place]) 07/16/2016  . Dyslipidemia (high LDL; low HDL) 07/16/2016  . Noncompliance 07/16/2016  . Smoking 07/16/2016  . Stroke ([place]) 12/19/2015   Current Outpatient Medications on File Prior to Visit  Medication Sig Dispense Refill  . albuterol (VENTOLIN HFA) 108 (90 Base) MCG/ACT inhaler Inhale into the lungs.    Marland Kitchen apixaban (ELIQUIS) 5 MG TABS tablet Take by mouth 2 (two) times daily.     Marland Kitchen aspirin EC 81 MG tablet Take 81 mg by mouth daily.    Marland Kitchen atorvastatin (LIPITOR) 40 MG tablet daily.     . carvedilol (COREG) 3.125 MG tablet Take 6.25 mg by  mouth every 12 (twelve) hours.    . diphenhydrAMINE (BENADRYL) 25 MG tablet Take 25 mg by mouth every 6 (six) hours as needed.    Marland Kitchen glipiZIDE (GLUCOTROL) 5 MG tablet 2.5 mg 2 (two) times daily before a meal.     . metFORMIN (GLUCOPHAGE) 500 MG tablet TAKE 1 TABLET BY MOUTH EVERY TWELVE HOURS    . nitroGLYCERIN (NITROSTAT) 0.4 MG SL tablet PLACE 1 TABLET (0.4 MG TOTAL) UNDER THE TONGUE EVERY FIVE (5) MINUTES AS NEEDED FOR CHEST PAIN.    Marland Kitchen topiramate (TOPAMAX) 50 MG tablet Take 1 tablet (50 mg total) by mouth daily. 30 tablet 3  . triamcinolone cream (KENALOG) 0.1 % Apply 1 application topically 2 (two) times daily.     No current facility-administered medications on file prior to visit.   Allergies  Allergen Reactions  . Penicillins Nausea And Vomiting  . Milk Protein Itching and Hives  . Milk-Related Compounds Hives and Itching    Recent Results (from the past 2160 hour(s))  WOUND CULTURE     Status: Abnormal   Collection Time: 04/23/19  1:08 PM   Specimen: Foot, Left; Wound   WOUND CULTURE AND SENS  Result Value Ref Range   Gram Stain Result Final report    Organism ID, Bacteria Comment     Comment: Rare white blood cells.   Organism ID, Bacteria Comment     Comment: Many gram negative  rods.   Organism ID, Bacteria Comment     Comment: Moderate number of gram positive cocci.   Organism ID, Bacteria Comment     Comment: Few gram positive rods.   Aerobic Bacterial Culture Final report (A)    Organism ID, Bacteria Alcaligenes faecalis (A)     Comment: Heavy growth   Organism ID, Bacteria Comment (A)     Comment: Achromobacter xylosoxidans Heavy growth    Organism ID, Bacteria Comment (A)     Comment: Beta hemolytic Streptococcus, group B Heavy growth Penicillin and ampicillin are drugs of choice for treatment of beta-hemolytic streptococcal infections. Susceptibility testing of penicillins and other beta-lactam agents approved by the FDA for treatment of beta-hemolytic  streptococcal infections need not be performed routinely because nonsusceptible isolates are extremely rare in any beta-hemolytic streptococcus and have not been reported for Streptococcus pyogenes (group A). (CLSI)    Organism ID, Bacteria Routine flora     Comment: Heavy growth   Antimicrobial Susceptibility Comment     Comment:       ** S = Susceptible; I = Intermediate; R = Resistant **                    P = Positive; N = Negative             MICS are expressed in micrograms per mL    Antibiotic                 RSLT#1    RSLT#2    RSLT#3    RSLT#4 Amikacin                       R         R Aztreonam                      R         R Cefepime                       I         R Cefotaxime                     R         R Ceftazidime                    R         R Ceftriaxone                    R         R Ciprofloxacin                  R         I Gentamicin                     R         R Imipenem                       S         S Levofloxacin                   I         S Meropenem  S         S Piperacillin/Tazobactam        S         S Tetracycline                   R         R Ticarcillin/Clavulanate        I         R Tobramycin                     R         R Trimethoprim/Sulfa             R         S     Objective: There were no vitals filed for this visit.  General: Patient is awake, alert, oriented x 3 and in no acute distress.  Dermatology: Skin is warm and dry bilateral with significant dryness to lower extremities  Partial thickness Sub met 5 on left ulceration measures4x2cm cm with eschar base that appears to be lifting with clear drainage and the wound joint/capsule exposure centrally but surrounding there is a granular base, there is malodor however no frank erythema swelling warmth to the area noted.  No other acute signs of infection.   Vascular: Pedal pulses are difficult to palpate due 1+ swelling.  Neurologic: Protective sensation  absent bilateral.  Musculosketal: There is pain with palpation to ulcerated at sub met 5 on left. No pain with compression to calves bilateral.  Patient is status post right fourth toe amputation which remains healed.   No results for input(s): GRAMSTAIN, LABORGA in the last 8760 hours.  Assessment and Plan:  Problem List Items Addressed This Visit      Endocrine   Diabetic peripheral neuropathy ([place])    Other Visit Diagnoses    Foot ulcer, left, with fat layer exposed ([place])    -  Primary   Relevant Orders   WOUND CULTURE   Cellulitis and abscess of foot, except toes       Relevant Medications   ciprofloxacin (CIPRO) 500 MG tablet   Other Relevant Orders   WOUND CULTURE   Left foot pain         -Examined patient and discussed the progression of the wounds and treatment alternatives. -Cleansed ulceration at sub met 5 on left - Excisionally dedbrided ulceration at sub met 5 on left to healthy bleeding borders removing nonviable tissue using a sterile chisel blade and tissue nipper to the level of the joint capsule. Wound measures post debridement as above. Wound was debrided to the level of the capsule with viable wound base exposed to promote healing. Hemostasis was achieved with manuel pressure. Patient tolerated procedure well without any major discomfort or anesthesia necessary for this wound debridement.  -Wound culture obtained -Started patient on antibiotics of Cipro due to malodor and advised patient that I am concerned infection may be progressing deeper however at this time he still refuses to get x-rays and wants to continue with local wound care and antibiotics -Applied Iodosorb and dry dressing and order home nursing for the patient since he has a difficult time with dressing the area due to his inability to been to get to the wound site to care for it - Advised patient to go to the ER or return to office if the wound worsens or if constitutional symptoms are  present. -Patient to return to office in 2  weeks for follow up care and evaluation or sooner if problems arise.  [name], DPM

## 2019-07-02 ENCOUNTER — Telehealth: Payer: Self-pay | Admitting: *Deleted

## 2019-07-02 ENCOUNTER — Other Ambulatory Visit: Payer: Self-pay

## 2019-07-02 NOTE — Telephone Encounter (Signed)
Called Tillie Rung at [facility] and got a referral for the patient to have the nurse come out and faxed over the paper work to [phone]. Craig Rollins

## 2019-07-02 NOTE — Telephone Encounter (Signed)
-----   Message from [place], [place] sent at 06/30/2019 10:33 AM EST ----- Regarding: [facility] for wound care nursing for left foot Apply iodosorb and dry dressing to lateral foot wound 2x per week since patient is unable to dressing wound himself. Thanks Dr. Cannon Kettle

## 2019-07-02 NOTE — Patient Outreach (Signed)
Telephone assessment:  Reviewed electronic medical record.   Placed call to patient who reports that he is ok. Reports increased pain in foot, worse at night. Increased drainage.  Reports saw foot doctor and foot was debrided.  Reports he was prescribed an antibiotic and he is taking it as prescribed. Reviewed and confirmed that patient has transportation to MD appointments. Patient is now living in an apartment. Confirms that he is staying warm and has food to eat.    Patient reports to me that foot doctor( Dr. Cannon Kettle) was ordering a home health nurse to assist with dressing changes.  Patient reports that he has not heard anything yet.  Reviewed medical record and order was placed today with [facility].  Also, patient mentions that he was supposed to get a shower bench and elevated toilet seat , that MD ordered. Reports he has not heard anything as of yet. Placed call to [facility] and spoke with provider [name] who states she will resubmit order. Reviewed case with provider.   PLAN: will follow up with patient in 1 week to confirm home health has started and that equipment has been received.  Tomasa Rand, RN, BSN, CEN [facility] [employer] [phone]

## 2019-07-03 LAB — WOUND CULTURE

## 2019-07-05 DIAGNOSIS — J449 Chronic obstructive pulmonary disease, unspecified: Secondary | ICD-10-CM | POA: Diagnosis not present

## 2019-07-05 DIAGNOSIS — Z7982 Long term (current) use of aspirin: Secondary | ICD-10-CM | POA: Diagnosis not present

## 2019-07-05 DIAGNOSIS — R32 Unspecified urinary incontinence: Secondary | ICD-10-CM | POA: Diagnosis not present

## 2019-07-05 DIAGNOSIS — E1142 Type 2 diabetes mellitus with diabetic polyneuropathy: Secondary | ICD-10-CM | POA: Diagnosis not present

## 2019-07-05 DIAGNOSIS — M2041 Other hammer toe(s) (acquired), right foot: Secondary | ICD-10-CM | POA: Diagnosis not present

## 2019-07-05 DIAGNOSIS — Z79899 Other long term (current) drug therapy: Secondary | ICD-10-CM | POA: Diagnosis not present

## 2019-07-05 DIAGNOSIS — I2510 Atherosclerotic heart disease of native coronary artery without angina pectoris: Secondary | ICD-10-CM | POA: Diagnosis not present

## 2019-07-05 DIAGNOSIS — I110 Hypertensive heart disease with heart failure: Secondary | ICD-10-CM | POA: Diagnosis not present

## 2019-07-05 DIAGNOSIS — M1991 Primary osteoarthritis, unspecified site: Secondary | ICD-10-CM | POA: Diagnosis not present

## 2019-07-05 DIAGNOSIS — L97521 Non-pressure chronic ulcer of other part of left foot limited to breakdown of skin: Secondary | ICD-10-CM | POA: Diagnosis not present

## 2019-07-05 DIAGNOSIS — Z7902 Long term (current) use of antithrombotics/antiplatelets: Secondary | ICD-10-CM | POA: Diagnosis not present

## 2019-07-05 DIAGNOSIS — I252 Old myocardial infarction: Secondary | ICD-10-CM | POA: Diagnosis not present

## 2019-07-05 DIAGNOSIS — M2042 Other hammer toe(s) (acquired), left foot: Secondary | ICD-10-CM | POA: Diagnosis not present

## 2019-07-05 DIAGNOSIS — E11621 Type 2 diabetes mellitus with foot ulcer: Secondary | ICD-10-CM | POA: Diagnosis not present

## 2019-07-05 DIAGNOSIS — Z7984 Long term (current) use of oral hypoglycemic drugs: Secondary | ICD-10-CM | POA: Diagnosis not present

## 2019-07-05 DIAGNOSIS — Z89421 Acquired absence of other right toe(s): Secondary | ICD-10-CM | POA: Diagnosis not present

## 2019-07-05 DIAGNOSIS — E785 Hyperlipidemia, unspecified: Secondary | ICD-10-CM | POA: Diagnosis not present

## 2019-07-05 DIAGNOSIS — Z8673 Personal history of transient ischemic attack (TIA), and cerebral infarction without residual deficits: Secondary | ICD-10-CM | POA: Diagnosis not present

## 2019-07-05 DIAGNOSIS — F17210 Nicotine dependence, cigarettes, uncomplicated: Secondary | ICD-10-CM | POA: Diagnosis not present

## 2019-07-05 DIAGNOSIS — I509 Heart failure, unspecified: Secondary | ICD-10-CM | POA: Diagnosis not present

## 2019-07-06 ENCOUNTER — Telehealth: Payer: Self-pay

## 2019-07-06 NOTE — Telephone Encounter (Signed)
Yes patient can have PT and OT at home Yes he can have home aids like chair, commode, and scale if they can fax the order then I can sign off on it Yes I am aware of the interaction of Cipro with his other meds, the benefits of the medication outweigh the risks so he should continue with it Thanks Dr. Cannon Kettle

## 2019-07-06 NOTE — Telephone Encounter (Signed)
Called and spoke with Kathrine Haddock from [facility] and advised her that per Dr. Cannon Kettle Pt can have PT and OT at home and pt can have home aids and for them to fax [place] the order for Dr. Cannon Kettle to sign it off. Also, I advised Madaline Savage that Dr. Cannon Kettle is aware of the interaction of cipro with the Pt's medications. Kathrine Haddock stated understanding

## 2019-07-06 NOTE — Telephone Encounter (Signed)
Kathrine Haddock from [facility] called and asked if the Pt can receive PT and occupational therapy. Also, Madaline Savage mentioned that the pt needs assistance at home with [facility] and would also like for you to prescribe a showering chair, bedside commode, and a scale for the patient's assistance. Also Madaline Savage mentioned if you are aware of the medication interaction of the abx that was Rx (Cipro) with the glipizide and aspirin. Thanks

## 2019-07-08 DIAGNOSIS — I110 Hypertensive heart disease with heart failure: Secondary | ICD-10-CM | POA: Diagnosis not present

## 2019-07-08 DIAGNOSIS — J449 Chronic obstructive pulmonary disease, unspecified: Secondary | ICD-10-CM | POA: Diagnosis not present

## 2019-07-08 DIAGNOSIS — I2510 Atherosclerotic heart disease of native coronary artery without angina pectoris: Secondary | ICD-10-CM | POA: Diagnosis not present

## 2019-07-08 DIAGNOSIS — I509 Heart failure, unspecified: Secondary | ICD-10-CM | POA: Diagnosis not present

## 2019-07-08 DIAGNOSIS — E11621 Type 2 diabetes mellitus with foot ulcer: Secondary | ICD-10-CM | POA: Diagnosis not present

## 2019-07-08 DIAGNOSIS — L97521 Non-pressure chronic ulcer of other part of left foot limited to breakdown of skin: Secondary | ICD-10-CM | POA: Diagnosis not present

## 2019-07-12 ENCOUNTER — Other Ambulatory Visit: Payer: Self-pay

## 2019-07-12 NOTE — Patient Outreach (Signed)
Case closure:  Placed call to patient who answered and identified himself. Patient reports that he thinks his foot is healing. Reports home health nurse comes and does dressing changes.  Patient reports that he has a shower seat now and an elevated toilet seat.    Reports no new needs or concerns.  Reviewed case closure with patient and how he has met his goals. Patient reports he has no other needs at this time and will call me in the future if needed. Reviewed that patient has my contact information and will call as needed.   PLAN: close case - no ED visits in greater than 31 days. Tomasa Rand, RN, BSN, CEN [facility] [employer] [phone]

## 2019-07-13 ENCOUNTER — Telehealth: Payer: Self-pay

## 2019-07-13 DIAGNOSIS — J449 Chronic obstructive pulmonary disease, unspecified: Secondary | ICD-10-CM | POA: Diagnosis not present

## 2019-07-13 DIAGNOSIS — E11621 Type 2 diabetes mellitus with foot ulcer: Secondary | ICD-10-CM | POA: Diagnosis not present

## 2019-07-13 DIAGNOSIS — I2510 Atherosclerotic heart disease of native coronary artery without angina pectoris: Secondary | ICD-10-CM | POA: Diagnosis not present

## 2019-07-13 DIAGNOSIS — L97521 Non-pressure chronic ulcer of other part of left foot limited to breakdown of skin: Secondary | ICD-10-CM | POA: Diagnosis not present

## 2019-07-13 DIAGNOSIS — I509 Heart failure, unspecified: Secondary | ICD-10-CM | POA: Diagnosis not present

## 2019-07-13 DIAGNOSIS — I110 Hypertensive heart disease with heart failure: Secondary | ICD-10-CM | POA: Diagnosis not present

## 2019-07-13 NOTE — Telephone Encounter (Signed)
Yes you can write it on a triad blue prescription paper for me and leave it on my desk to sign for the patient

## 2019-07-13 NOTE — Telephone Encounter (Signed)
Remo Lipps from [facility] PT states the nurse saw the Pt this week and she put an order for PT. Remo Lipps states in order to schedule pt for PT he needs a fax written order that states," Pt okay for weight barring as tolerating on Lt foot for PT." Please advice

## 2019-07-14 ENCOUNTER — Ambulatory Visit (INDEPENDENT_AMBULATORY_CARE_PROVIDER_SITE_OTHER): Payer: Medicare Other | Admitting: Sports Medicine

## 2019-07-14 ENCOUNTER — Other Ambulatory Visit: Payer: Self-pay

## 2019-07-14 ENCOUNTER — Encounter: Payer: Self-pay | Admitting: Sports Medicine

## 2019-07-14 DIAGNOSIS — L97522 Non-pressure chronic ulcer of other part of left foot with fat layer exposed: Secondary | ICD-10-CM

## 2019-07-14 DIAGNOSIS — E1142 Type 2 diabetes mellitus with diabetic polyneuropathy: Secondary | ICD-10-CM | POA: Diagnosis not present

## 2019-07-14 DIAGNOSIS — M79672 Pain in left foot: Secondary | ICD-10-CM

## 2019-07-14 DIAGNOSIS — L03119 Cellulitis of unspecified part of limb: Secondary | ICD-10-CM

## 2019-07-14 DIAGNOSIS — R234 Changes in skin texture: Secondary | ICD-10-CM

## 2019-07-14 DIAGNOSIS — L02619 Cutaneous abscess of unspecified foot: Secondary | ICD-10-CM

## 2019-07-14 DIAGNOSIS — M79671 Pain in right foot: Secondary | ICD-10-CM

## 2019-07-14 MED ORDER — CIPROFLOXACIN HCL 500 MG PO TABS: 500.0000 mg | ORAL_TABLET | Freq: Two times a day (BID) | ORAL | 0 refills | Status: DC

## 2019-07-14 NOTE — Progress Notes (Signed)
Subjective: Craig Rollins is a 64 y.o. male patient seen in office for follow up evaluation of ulceration of the left  plantar foot.  Patient reports that it seems like the ulcer is doing better and healing up and has home nurses that help with changing dressing with the same drainage and no redness, swelling, odor. Reports dry skin to right heel that he wants me to check. Patient denies nausea vomiting fever chills or any other constitutional symptoms at this time.  FBS 204 and last A1c 7.4  Patient Active Problem List   Diagnosis Date Noted  . Chest discomfort 08/13/2018  . CHF (congestive heart failure) ([place]) 08/13/2018  . COPD (chronic obstructive pulmonary disease) ([place]) 08/13/2018  . Open wound 08/13/2018  . Shortness of breath 08/13/2018  . CAD in native artery 02/18/2018  . Aortic stenosis 02/18/2018  . Essential hypertension 02/18/2018  . Foot swelling 01/26/2018  . Diabetes mellitus, type 2 ([place]) 01/26/2018  . Diabetic peripheral neuropathy ([place]) 02/25/2017  . Hammer toes of both feet 02/25/2017  . Risk for falls 09/23/2016  . NSTEMI (non-ST elevated myocardial infarction) ([place]) 08/30/2016  . Wound eschar of foot 08/21/2016  . Dilated cardiomyopathy ([place]) 07/16/2016  . Dyslipidemia (high LDL; low HDL) 07/16/2016  . Noncompliance 07/16/2016  . Smoking 07/16/2016  . Stroke ([place]) 12/19/2015   Current Outpatient Medications on File Prior to Visit  Medication Sig Dispense Refill  . albuterol (VENTOLIN HFA) 108 (90 Base) MCG/ACT inhaler Inhale into the lungs.    Marland Kitchen apixaban (ELIQUIS) 5 MG TABS tablet Take by mouth 2 (two) times daily.     Marland Kitchen aspirin EC 81 MG tablet Take 81 mg by mouth daily.    Marland Kitchen atorvastatin (LIPITOR) 40 MG tablet daily.     . carvedilol (COREG) 3.125 MG tablet Take 6.25 mg by mouth every 12 (twelve) hours.    . diphenhydrAMINE (BENADRYL) 25 MG tablet Take 25 mg by mouth every 6 (six) hours as needed.    Marland Kitchen glipiZIDE (GLUCOTROL) 5 MG tablet 2.5 mg 2  (two) times daily before a meal.     . lisinopril (ZESTRIL) 5 MG tablet Take 5 mg by mouth daily.    . metFORMIN (GLUCOPHAGE) 500 MG tablet TAKE 1 TABLET BY MOUTH EVERY TWELVE HOURS    . nitroGLYCERIN (NITROSTAT) 0.4 MG SL tablet PLACE 1 TABLET (0.4 MG TOTAL) UNDER THE TONGUE EVERY FIVE (5) MINUTES AS NEEDED FOR CHEST PAIN.    Marland Kitchen topiramate (TOPAMAX) 50 MG tablet Take 1 tablet (50 mg total) by mouth daily. 30 tablet 3  . triamcinolone cream (KENALOG) 0.1 % Apply 1 application topically 2 (two) times daily.     No current facility-administered medications on file prior to visit.   Allergies  Allergen Reactions  . Penicillins Nausea And Vomiting  . Milk Protein Itching and Hives  . Milk-Related Compounds Hives and Itching    Recent Results (from the past 2160 hour(s))  WOUND CULTURE     Status: Abnormal   Collection Time: 04/23/19  1:08 PM   Specimen: Foot, Left; Wound   WOUND CULTURE AND SENS  Result Value Ref Range   Gram Stain Result Final report    Organism ID, Bacteria Comment     Comment: Rare white blood cells.   Organism ID, Bacteria Comment     Comment: Many gram negative rods.   Organism ID, Bacteria Comment     Comment: Moderate number of gram positive cocci.   Organism ID, Bacteria Comment  Comment: Few gram positive rods.   Aerobic Bacterial Culture Final report (A)    Organism ID, Bacteria Alcaligenes faecalis (A)     Comment: Heavy growth   Organism ID, Bacteria Comment (A)     Comment: Achromobacter xylosoxidans Heavy growth    Organism ID, Bacteria Comment (A)     Comment: Beta hemolytic Streptococcus, group B Heavy growth Penicillin and ampicillin are drugs of choice for treatment of beta-hemolytic streptococcal infections. Susceptibility testing of penicillins and other beta-lactam agents approved by the FDA for treatment of beta-hemolytic streptococcal infections need not be performed routinely because nonsusceptible isolates are extremely rare in any  beta-hemolytic streptococcus and have not been reported for Streptococcus pyogenes (group A). (CLSI)    Organism ID, Bacteria Routine flora     Comment: Heavy growth   Antimicrobial Susceptibility Comment     Comment:       ** S = Susceptible; I = Intermediate; R = Resistant **                    P = Positive; N = Negative             MICS are expressed in micrograms per mL    Antibiotic                 RSLT#1    RSLT#2    RSLT#3    RSLT#4 Amikacin                       R         R Aztreonam                      R         R Cefepime                       I         R Cefotaxime                     R         R Ceftazidime                    R         R Ceftriaxone                    R         R Ciprofloxacin                  R         I Gentamicin                     R         R Imipenem                       S         S Levofloxacin                   I         S Meropenem                      S         S Piperacillin/Tazobactam        S  S Tetracycline                   R         R Ticarcillin/Clavulanate        I         R Tobramycin                     R         R Trimethoprim/Sulfa             R         S   WOUND CULTURE     Status: Abnormal   Collection Time: 06/30/19 10:27 AM   Specimen: Foot, Left; Wound   WOUND CULTURE AND SENS  Result Value Ref Range   Gram Stain Result Final report    Organism ID, Bacteria Comment     Comment: No white blood cells seen.   Organism ID, Bacteria Comment     Comment: Few gram positive cocci   Organism ID, Bacteria Comment     Comment: Few gram negative rods.   Aerobic Bacterial Culture Final report (A)    Organism ID, Bacteria Comment (A)     Comment: Beta hemolytic Streptococcus, group B Heavy growth Penicillin and ampicillin are drugs of choice for treatment of beta-hemolytic streptococcal infections. Susceptibility testing of penicillins and other beta-lactam agents approved by the FDA for treatment of beta-hemolytic  streptococcal infections need not be performed routinely because nonsusceptible isolates are extremely rare in any beta-hemolytic streptococcus and have not been reported for Streptococcus pyogenes (group A). (CLSI)    Organism ID, Bacteria Comment     Comment: Mixed skin flora including multiple gram negative rods. Heavy growth     Objective: There were no vitals filed for this visit.  General: Patient is awake, alert, oriented x 3 and in no acute distress.  Dermatology: Skin is warm and dry bilateral with significant dryness to lower extremities  Full thickness Sub met 5 on left ulceration measures3x2cm cm with fibrogranular base that appears to be lifting with clear drainage and the wound joint/capsule exposure centrally but surrounding there is a granular base, there is no malodor, no erythema, no swelling warmth to the area noted.  Dry skin to right heel with no signs of infection.  No other acute signs of infection.   Vascular: Pedal pulses are difficult to palpate due 1+ swelling.  Neurologic: Protective sensation absent bilateral.  Musculosketal: There is mild pain with palpation to ulcerated at sub met 5 on left. No pain with compression to calves bilateral.  Patient is status post right fourth toe amputation which remains healed.   No results for input(s): GRAMSTAIN, LABORGA in the last 8760 hours.  Assessment and Plan:  Problem List Items Addressed This Visit      Endocrine   Diabetic peripheral neuropathy ([place])   Relevant Medications   lisinopril (ZESTRIL) 5 MG tablet    Other Visit Diagnoses    Foot ulcer, left, with fat layer exposed ([place])    -  Primary   Cellulitis and abscess of foot, except toes       Relevant Medications   ciprofloxacin (CIPRO) 500 MG tablet   Left foot pain       Right foot pain       Fissure in skin of foot         -Examined patient and discussed the progression of the wounds and treatment alternatives. -Cleansed ulceration at  sub met 5 on left - Excisionally dedbrided ulceration at sub met 5 on left to healthy bleeding borders removing nonviable tissue using a sterile chisel blade and tissue nipper to the level of the joint capsule. Wound measures post debridement as above. Wound was debrided to the level of the capsule with viable wound base exposed to promote healing. Hemostasis was achieved with manuel pressure. Patient tolerated procedure well without any major discomfort or anesthesia necessary for this wound debridement.  -Wound culture obtained -Refilled cipro -Applied Iodosorb and dry dressing and order home nursing to continue with the same and apply foot miracle cream to dry skin areas especially on the heel as provided - Advised patient to go to the ER or return to office if the wound worsens or if constitutional symptoms are present. -Patient to return to office in 2 weeks for follow up care and evaluation or sooner if problems arise.  [name], DPM

## 2019-07-15 NOTE — Telephone Encounter (Signed)
Rx for Pt was faxed to [facility] from [facility]

## 2019-07-16 ENCOUNTER — Telehealth: Payer: Self-pay

## 2019-07-16 DIAGNOSIS — L97521 Non-pressure chronic ulcer of other part of left foot limited to breakdown of skin: Secondary | ICD-10-CM | POA: Diagnosis not present

## 2019-07-16 DIAGNOSIS — I2510 Atherosclerotic heart disease of native coronary artery without angina pectoris: Secondary | ICD-10-CM | POA: Diagnosis not present

## 2019-07-16 DIAGNOSIS — I509 Heart failure, unspecified: Secondary | ICD-10-CM | POA: Diagnosis not present

## 2019-07-16 DIAGNOSIS — E11621 Type 2 diabetes mellitus with foot ulcer: Secondary | ICD-10-CM | POA: Diagnosis not present

## 2019-07-16 DIAGNOSIS — I110 Hypertensive heart disease with heart failure: Secondary | ICD-10-CM | POA: Diagnosis not present

## 2019-07-16 DIAGNOSIS — J449 Chronic obstructive pulmonary disease, unspecified: Secondary | ICD-10-CM | POA: Diagnosis not present

## 2019-07-16 NOTE — Telephone Encounter (Signed)
Patient had Cipro before. I am aware of the interaction. The benefits of the medication outweigh the risks

## 2019-07-16 NOTE — Telephone Encounter (Signed)
Craig Rollins from [facility] caled stating/ concern of the drug interaction between pt's cipro and glipizide. Nurse is concern of the glyco glycemic effect

## 2019-07-19 DIAGNOSIS — I509 Heart failure, unspecified: Secondary | ICD-10-CM | POA: Diagnosis not present

## 2019-07-19 DIAGNOSIS — I2510 Atherosclerotic heart disease of native coronary artery without angina pectoris: Secondary | ICD-10-CM | POA: Diagnosis not present

## 2019-07-19 DIAGNOSIS — L97521 Non-pressure chronic ulcer of other part of left foot limited to breakdown of skin: Secondary | ICD-10-CM | POA: Diagnosis not present

## 2019-07-19 DIAGNOSIS — J449 Chronic obstructive pulmonary disease, unspecified: Secondary | ICD-10-CM | POA: Diagnosis not present

## 2019-07-19 DIAGNOSIS — E11621 Type 2 diabetes mellitus with foot ulcer: Secondary | ICD-10-CM | POA: Diagnosis not present

## 2019-07-19 DIAGNOSIS — I110 Hypertensive heart disease with heart failure: Secondary | ICD-10-CM | POA: Diagnosis not present

## 2019-07-19 NOTE — Telephone Encounter (Signed)
Tried to return Stacy's phone call about Mr. Craig Rollins but the mail box is full and can not accept any new messages.

## 2019-07-20 DIAGNOSIS — I509 Heart failure, unspecified: Secondary | ICD-10-CM | POA: Diagnosis not present

## 2019-07-20 DIAGNOSIS — E11621 Type 2 diabetes mellitus with foot ulcer: Secondary | ICD-10-CM | POA: Diagnosis not present

## 2019-07-20 DIAGNOSIS — I110 Hypertensive heart disease with heart failure: Secondary | ICD-10-CM | POA: Diagnosis not present

## 2019-07-20 DIAGNOSIS — I2510 Atherosclerotic heart disease of native coronary artery without angina pectoris: Secondary | ICD-10-CM | POA: Diagnosis not present

## 2019-07-20 DIAGNOSIS — J449 Chronic obstructive pulmonary disease, unspecified: Secondary | ICD-10-CM | POA: Diagnosis not present

## 2019-07-20 DIAGNOSIS — L97521 Non-pressure chronic ulcer of other part of left foot limited to breakdown of skin: Secondary | ICD-10-CM | POA: Diagnosis not present

## 2019-07-22 DIAGNOSIS — J449 Chronic obstructive pulmonary disease, unspecified: Secondary | ICD-10-CM | POA: Diagnosis not present

## 2019-07-22 DIAGNOSIS — I110 Hypertensive heart disease with heart failure: Secondary | ICD-10-CM | POA: Diagnosis not present

## 2019-07-22 DIAGNOSIS — E11621 Type 2 diabetes mellitus with foot ulcer: Secondary | ICD-10-CM | POA: Diagnosis not present

## 2019-07-22 DIAGNOSIS — I509 Heart failure, unspecified: Secondary | ICD-10-CM | POA: Diagnosis not present

## 2019-07-22 DIAGNOSIS — L97521 Non-pressure chronic ulcer of other part of left foot limited to breakdown of skin: Secondary | ICD-10-CM | POA: Diagnosis not present

## 2019-07-22 DIAGNOSIS — I2510 Atherosclerotic heart disease of native coronary artery without angina pectoris: Secondary | ICD-10-CM | POA: Diagnosis not present

## 2019-07-26 ENCOUNTER — Telehealth: Payer: Self-pay

## 2019-07-26 DIAGNOSIS — I509 Heart failure, unspecified: Secondary | ICD-10-CM | POA: Diagnosis not present

## 2019-07-26 DIAGNOSIS — I110 Hypertensive heart disease with heart failure: Secondary | ICD-10-CM | POA: Diagnosis not present

## 2019-07-26 DIAGNOSIS — I2510 Atherosclerotic heart disease of native coronary artery without angina pectoris: Secondary | ICD-10-CM | POA: Diagnosis not present

## 2019-07-26 DIAGNOSIS — J449 Chronic obstructive pulmonary disease, unspecified: Secondary | ICD-10-CM | POA: Diagnosis not present

## 2019-07-26 DIAGNOSIS — L97521 Non-pressure chronic ulcer of other part of left foot limited to breakdown of skin: Secondary | ICD-10-CM | POA: Diagnosis not present

## 2019-07-26 DIAGNOSIS — E11621 Type 2 diabetes mellitus with foot ulcer: Secondary | ICD-10-CM | POA: Diagnosis not present

## 2019-07-26 NOTE — Telephone Encounter (Signed)
Stacy from [facility] called concerning pt's wound care. Marzetta Board states pt has been having more drainage form his wound and it's coming through his dressing and sock. Marzetta Board states she is seeing pt twice a week for wound care and would like to know if she can start using any type of silver nitrate. Please advice with any change or new wound orders for Pt.

## 2019-07-26 NOTE — Telephone Encounter (Signed)
Yes she can change to using Silver nitrate or a silver alginate something that can absorb drainage Thanks Dr. Chauncey Cruel

## 2019-07-27 NOTE — Telephone Encounter (Signed)
Spoke with Marzetta Board and reviewed Dr's new wound instructions

## 2019-07-28 ENCOUNTER — Other Ambulatory Visit: Payer: Self-pay

## 2019-07-28 ENCOUNTER — Encounter: Payer: Self-pay | Admitting: Sports Medicine

## 2019-07-28 ENCOUNTER — Ambulatory Visit (INDEPENDENT_AMBULATORY_CARE_PROVIDER_SITE_OTHER): Payer: Medicare Other | Admitting: Sports Medicine

## 2019-07-28 DIAGNOSIS — L02619 Cutaneous abscess of unspecified foot: Secondary | ICD-10-CM

## 2019-07-28 DIAGNOSIS — E1142 Type 2 diabetes mellitus with diabetic polyneuropathy: Secondary | ICD-10-CM

## 2019-07-28 DIAGNOSIS — L97522 Non-pressure chronic ulcer of other part of left foot with fat layer exposed: Secondary | ICD-10-CM | POA: Diagnosis not present

## 2019-07-28 DIAGNOSIS — L03119 Cellulitis of unspecified part of limb: Secondary | ICD-10-CM

## 2019-07-28 DIAGNOSIS — M79672 Pain in left foot: Secondary | ICD-10-CM

## 2019-07-28 NOTE — Progress Notes (Signed)
Subjective: Craig Rollins is a 64 y.o. male patient seen in office for follow up evaluation of ulceration of the left  plantar foot.  Patient reports that it seems to be doing all right reports that the nurse states that it looks kind of white around from the drainage but denies any increase in redness warmth or swelling and reports that the therapist is also aware clean with him at home.  Patient reports that he has completed all of his oral antibiotics.  Patient denies nausea vomiting fever chills or any other constitutional symptoms at this time.  FBS 129 and last A1c 7.4  Patient Active Problem List   Diagnosis Date Noted  . Chest discomfort 08/13/2018  . CHF (congestive heart failure) ([place]) 08/13/2018  . COPD (chronic obstructive pulmonary disease) ([place]) 08/13/2018  . Open wound 08/13/2018  . Shortness of breath 08/13/2018  . CAD in native artery 02/18/2018  . Aortic stenosis 02/18/2018  . Essential hypertension 02/18/2018  . Foot swelling 01/26/2018  . Diabetes mellitus, type 2 ([place]) 01/26/2018  . Diabetic peripheral neuropathy ([place]) 02/25/2017  . Hammer toes of both feet 02/25/2017  . Risk for falls 09/23/2016  . NSTEMI (non-ST elevated myocardial infarction) ([place]) 08/30/2016  . Wound eschar of foot 08/21/2016  . Dilated cardiomyopathy ([place]) 07/16/2016  . Dyslipidemia (high LDL; low HDL) 07/16/2016  . Noncompliance 07/16/2016  . Smoking 07/16/2016  . Stroke ([place]) 12/19/2015   Current Outpatient Medications on File Prior to Visit  Medication Sig Dispense Refill  . albuterol (VENTOLIN HFA) 108 (90 Base) MCG/ACT inhaler Inhale into the lungs.    Marland Kitchen apixaban (ELIQUIS) 5 MG TABS tablet Take by mouth 2 (two) times daily.     Marland Kitchen aspirin EC 81 MG tablet Take 81 mg by mouth daily.    Marland Kitchen atorvastatin (LIPITOR) 40 MG tablet daily.     . carvedilol (COREG) 3.125 MG tablet Take 6.25 mg by mouth every 12 (twelve) hours.    . ciprofloxacin (CIPRO) 500 MG tablet Take 1 tablet (500 mg  total) by mouth 2 (two) times daily. 20 tablet 0  . diphenhydrAMINE (BENADRYL) 25 MG tablet Take 25 mg by mouth every 6 (six) hours as needed.    Marland Kitchen glipiZIDE (GLUCOTROL) 5 MG tablet 2.5 mg 2 (two) times daily before a meal.     . lisinopril (ZESTRIL) 5 MG tablet Take 5 mg by mouth daily.    . metFORMIN (GLUCOPHAGE) 500 MG tablet TAKE 1 TABLET BY MOUTH EVERY TWELVE HOURS    . nitroGLYCERIN (NITROSTAT) 0.4 MG SL tablet PLACE 1 TABLET (0.4 MG TOTAL) UNDER THE TONGUE EVERY FIVE (5) MINUTES AS NEEDED FOR CHEST PAIN.    Marland Kitchen topiramate (TOPAMAX) 50 MG tablet Take 1 tablet (50 mg total) by mouth daily. 30 tablet 3  . triamcinolone cream (KENALOG) 0.1 % Apply 1 application topically 2 (two) times daily.     No current facility-administered medications on file prior to visit.   Allergies  Allergen Reactions  . Penicillins Nausea And Vomiting  . Milk Protein Itching and Hives  . Milk-Related Compounds Hives and Itching    Recent Results (from the past 2160 hour(s))  WOUND CULTURE     Status: Abnormal   Collection Time: 06/30/19 10:27 AM   Specimen: Foot, Left; Wound   WOUND CULTURE AND SENS  Result Value Ref Range   Gram Stain Result Final report    Organism ID, Bacteria Comment     Comment: No white blood cells seen.  Organism ID, Bacteria Comment     Comment: Few gram positive cocci   Organism ID, Bacteria Comment     Comment: Few gram negative rods.   Aerobic Bacterial Culture Final report (A)    Organism ID, Bacteria Comment (A)     Comment: Beta hemolytic Streptococcus, group B Heavy growth Penicillin and ampicillin are drugs of choice for treatment of beta-hemolytic streptococcal infections. Susceptibility testing of penicillins and other beta-lactam agents approved by the FDA for treatment of beta-hemolytic streptococcal infections need not be performed routinely because nonsusceptible isolates are extremely rare in any beta-hemolytic streptococcus and have not been reported for  Streptococcus pyogenes (group A). (CLSI)    Organism ID, Bacteria Comment     Comment: Mixed skin flora including multiple gram negative rods. Heavy growth     Objective: There were no vitals filed for this visit.  General: Patient is awake, alert, oriented x 3 and in no acute distress.  Dermatology: Skin is warm and dry bilateral with significant dryness to lower extremities  Full thickness Sub met 5 on left ulceration measures 2.5 x 1.5 cm cm with fibrogranular base with no exposed joint capsule and more, there is no malodor, no erythema, no swelling warmth to the area noted.  Dry skin to right heel with no signs of infection.  No other acute signs of infection.   Vascular: Pedal pulses are difficult to palpate due 1+ swelling.  Neurologic: Protective sensation absent bilateral.  Musculosketal: There is very mild pain with palpation to ulcerated at sub met 5 on left. No pain with compression to calves bilateral.  Patient is status post right fourth toe amputation which remains healed.   No results for input(s): GRAMSTAIN, LABORGA in the last 8760 hours.  Assessment and Plan:  Problem List Items Addressed This Visit      Endocrine   Diabetic peripheral neuropathy ([place])    Other Visit Diagnoses    Cellulitis and abscess of foot, except toes    -  Primary   Foot ulcer, left, with fat layer exposed ([place])       Left foot pain         -Examined patient and discussed the progression of the wounds and treatment alternatives. -Cleansed ulceration at sub met 5 on left - Excisionally dedbrided ulceration at sub met 5 on left to healthy bleeding borders removing nonviable tissue using a sterile chisel blade and tissue nipper to the level of the joint capsule. Wound measures post debridement as above. Wound was debrided to the level of the capsule with viable wound base exposed to promote healing. Hemostasis was achieved with manuel pressure. Patient tolerated procedure well without  any major discomfort or anesthesia necessary for this wound debridement.  -Applied Lumicain and dry dressing and order home nursing to use silver nitrate or silver alginate to the area and apply foot miracle cream to dry skin areas especially on the heels - Advised patient to go to the ER or return to office if the wound worsens or if constitutional symptoms are present. -Patient to return to office in 2 to 3 weeks for follow up care and evaluation or sooner if problems arise.  [name], DPM

## 2019-07-29 DIAGNOSIS — J449 Chronic obstructive pulmonary disease, unspecified: Secondary | ICD-10-CM | POA: Diagnosis not present

## 2019-07-29 DIAGNOSIS — I509 Heart failure, unspecified: Secondary | ICD-10-CM | POA: Diagnosis not present

## 2019-07-29 DIAGNOSIS — I110 Hypertensive heart disease with heart failure: Secondary | ICD-10-CM | POA: Diagnosis not present

## 2019-07-29 DIAGNOSIS — E11621 Type 2 diabetes mellitus with foot ulcer: Secondary | ICD-10-CM | POA: Diagnosis not present

## 2019-07-29 DIAGNOSIS — I2510 Atherosclerotic heart disease of native coronary artery without angina pectoris: Secondary | ICD-10-CM | POA: Diagnosis not present

## 2019-07-29 DIAGNOSIS — L97521 Non-pressure chronic ulcer of other part of left foot limited to breakdown of skin: Secondary | ICD-10-CM | POA: Diagnosis not present

## 2019-07-30 ENCOUNTER — Telehealth: Payer: Self-pay

## 2019-07-30 DIAGNOSIS — J449 Chronic obstructive pulmonary disease, unspecified: Secondary | ICD-10-CM | POA: Diagnosis not present

## 2019-07-30 DIAGNOSIS — I2510 Atherosclerotic heart disease of native coronary artery without angina pectoris: Secondary | ICD-10-CM | POA: Diagnosis not present

## 2019-07-30 DIAGNOSIS — I509 Heart failure, unspecified: Secondary | ICD-10-CM | POA: Diagnosis not present

## 2019-07-30 DIAGNOSIS — I110 Hypertensive heart disease with heart failure: Secondary | ICD-10-CM | POA: Diagnosis not present

## 2019-07-30 DIAGNOSIS — E11621 Type 2 diabetes mellitus with foot ulcer: Secondary | ICD-10-CM | POA: Diagnosis not present

## 2019-07-30 DIAGNOSIS — L97521 Non-pressure chronic ulcer of other part of left foot limited to breakdown of skin: Secondary | ICD-10-CM | POA: Diagnosis not present

## 2019-07-30 NOTE — Telephone Encounter (Signed)
I approve of these items let me know if we need to send a Rx to [facility] for these items or if he can take a verbal order

## 2019-07-30 NOTE — Telephone Encounter (Signed)
Ronalee Belts occupational therapist from [facility] called stating pt was seen and evaluated yesterday by him. Ronalee Belts is requesting an approval for medical equipments that the pt needs such as a tub grab and a bed mobility aide. Ronalee Belts states pt is seen by him once a wk for 2 wks

## 2019-08-02 DIAGNOSIS — L97521 Non-pressure chronic ulcer of other part of left foot limited to breakdown of skin: Secondary | ICD-10-CM | POA: Diagnosis not present

## 2019-08-02 DIAGNOSIS — E11621 Type 2 diabetes mellitus with foot ulcer: Secondary | ICD-10-CM | POA: Diagnosis not present

## 2019-08-02 DIAGNOSIS — I110 Hypertensive heart disease with heart failure: Secondary | ICD-10-CM | POA: Diagnosis not present

## 2019-08-02 DIAGNOSIS — J449 Chronic obstructive pulmonary disease, unspecified: Secondary | ICD-10-CM | POA: Diagnosis not present

## 2019-08-02 DIAGNOSIS — I2510 Atherosclerotic heart disease of native coronary artery without angina pectoris: Secondary | ICD-10-CM | POA: Diagnosis not present

## 2019-08-02 DIAGNOSIS — I509 Heart failure, unspecified: Secondary | ICD-10-CM | POA: Diagnosis not present

## 2019-08-02 NOTE — Telephone Encounter (Signed)
LVM to [facility] occupational therapist from [facility] to return our phone call to state/verfy verbal approval for pt's medical equipment

## 2019-08-03 DIAGNOSIS — J449 Chronic obstructive pulmonary disease, unspecified: Secondary | ICD-10-CM | POA: Diagnosis not present

## 2019-08-03 DIAGNOSIS — I509 Heart failure, unspecified: Secondary | ICD-10-CM | POA: Diagnosis not present

## 2019-08-03 DIAGNOSIS — L97521 Non-pressure chronic ulcer of other part of left foot limited to breakdown of skin: Secondary | ICD-10-CM | POA: Diagnosis not present

## 2019-08-03 DIAGNOSIS — E11621 Type 2 diabetes mellitus with foot ulcer: Secondary | ICD-10-CM | POA: Diagnosis not present

## 2019-08-03 DIAGNOSIS — I2510 Atherosclerotic heart disease of native coronary artery without angina pectoris: Secondary | ICD-10-CM | POA: Diagnosis not present

## 2019-08-03 DIAGNOSIS — I110 Hypertensive heart disease with heart failure: Secondary | ICD-10-CM | POA: Diagnosis not present

## 2019-08-04 DIAGNOSIS — L97521 Non-pressure chronic ulcer of other part of left foot limited to breakdown of skin: Secondary | ICD-10-CM | POA: Diagnosis not present

## 2019-08-04 DIAGNOSIS — I509 Heart failure, unspecified: Secondary | ICD-10-CM | POA: Diagnosis not present

## 2019-08-04 DIAGNOSIS — E785 Hyperlipidemia, unspecified: Secondary | ICD-10-CM | POA: Diagnosis not present

## 2019-08-04 DIAGNOSIS — Z79899 Other long term (current) drug therapy: Secondary | ICD-10-CM | POA: Diagnosis not present

## 2019-08-04 DIAGNOSIS — Z89421 Acquired absence of other right toe(s): Secondary | ICD-10-CM | POA: Diagnosis not present

## 2019-08-04 DIAGNOSIS — E11621 Type 2 diabetes mellitus with foot ulcer: Secondary | ICD-10-CM | POA: Diagnosis not present

## 2019-08-04 DIAGNOSIS — Z7984 Long term (current) use of oral hypoglycemic drugs: Secondary | ICD-10-CM | POA: Diagnosis not present

## 2019-08-04 DIAGNOSIS — M1991 Primary osteoarthritis, unspecified site: Secondary | ICD-10-CM | POA: Diagnosis not present

## 2019-08-04 DIAGNOSIS — F17210 Nicotine dependence, cigarettes, uncomplicated: Secondary | ICD-10-CM | POA: Diagnosis not present

## 2019-08-04 DIAGNOSIS — J449 Chronic obstructive pulmonary disease, unspecified: Secondary | ICD-10-CM | POA: Diagnosis not present

## 2019-08-04 DIAGNOSIS — Z7902 Long term (current) use of antithrombotics/antiplatelets: Secondary | ICD-10-CM | POA: Diagnosis not present

## 2019-08-04 DIAGNOSIS — I110 Hypertensive heart disease with heart failure: Secondary | ICD-10-CM | POA: Diagnosis not present

## 2019-08-04 DIAGNOSIS — I2510 Atherosclerotic heart disease of native coronary artery without angina pectoris: Secondary | ICD-10-CM | POA: Diagnosis not present

## 2019-08-04 DIAGNOSIS — M2042 Other hammer toe(s) (acquired), left foot: Secondary | ICD-10-CM | POA: Diagnosis not present

## 2019-08-04 DIAGNOSIS — M2041 Other hammer toe(s) (acquired), right foot: Secondary | ICD-10-CM | POA: Diagnosis not present

## 2019-08-04 DIAGNOSIS — I252 Old myocardial infarction: Secondary | ICD-10-CM | POA: Diagnosis not present

## 2019-08-04 DIAGNOSIS — Z7982 Long term (current) use of aspirin: Secondary | ICD-10-CM | POA: Diagnosis not present

## 2019-08-04 DIAGNOSIS — R32 Unspecified urinary incontinence: Secondary | ICD-10-CM | POA: Diagnosis not present

## 2019-08-04 DIAGNOSIS — Z8673 Personal history of transient ischemic attack (TIA), and cerebral infarction without residual deficits: Secondary | ICD-10-CM | POA: Diagnosis not present

## 2019-08-04 DIAGNOSIS — E1142 Type 2 diabetes mellitus with diabetic polyneuropathy: Secondary | ICD-10-CM | POA: Diagnosis not present

## 2019-08-05 DIAGNOSIS — I509 Heart failure, unspecified: Secondary | ICD-10-CM | POA: Diagnosis not present

## 2019-08-05 DIAGNOSIS — I2510 Atherosclerotic heart disease of native coronary artery without angina pectoris: Secondary | ICD-10-CM | POA: Diagnosis not present

## 2019-08-05 DIAGNOSIS — J449 Chronic obstructive pulmonary disease, unspecified: Secondary | ICD-10-CM | POA: Diagnosis not present

## 2019-08-05 DIAGNOSIS — I110 Hypertensive heart disease with heart failure: Secondary | ICD-10-CM | POA: Diagnosis not present

## 2019-08-05 DIAGNOSIS — L97521 Non-pressure chronic ulcer of other part of left foot limited to breakdown of skin: Secondary | ICD-10-CM | POA: Diagnosis not present

## 2019-08-05 DIAGNOSIS — E11621 Type 2 diabetes mellitus with foot ulcer: Secondary | ICD-10-CM | POA: Diagnosis not present

## 2019-08-09 DIAGNOSIS — J449 Chronic obstructive pulmonary disease, unspecified: Secondary | ICD-10-CM | POA: Diagnosis not present

## 2019-08-09 DIAGNOSIS — I110 Hypertensive heart disease with heart failure: Secondary | ICD-10-CM | POA: Diagnosis not present

## 2019-08-09 DIAGNOSIS — E11621 Type 2 diabetes mellitus with foot ulcer: Secondary | ICD-10-CM | POA: Diagnosis not present

## 2019-08-09 DIAGNOSIS — I2510 Atherosclerotic heart disease of native coronary artery without angina pectoris: Secondary | ICD-10-CM | POA: Diagnosis not present

## 2019-08-09 DIAGNOSIS — L97521 Non-pressure chronic ulcer of other part of left foot limited to breakdown of skin: Secondary | ICD-10-CM | POA: Diagnosis not present

## 2019-08-09 DIAGNOSIS — I509 Heart failure, unspecified: Secondary | ICD-10-CM | POA: Diagnosis not present

## 2019-08-11 DIAGNOSIS — J449 Chronic obstructive pulmonary disease, unspecified: Secondary | ICD-10-CM | POA: Diagnosis not present

## 2019-08-11 DIAGNOSIS — I2510 Atherosclerotic heart disease of native coronary artery without angina pectoris: Secondary | ICD-10-CM | POA: Diagnosis not present

## 2019-08-11 DIAGNOSIS — I509 Heart failure, unspecified: Secondary | ICD-10-CM | POA: Diagnosis not present

## 2019-08-11 DIAGNOSIS — L97521 Non-pressure chronic ulcer of other part of left foot limited to breakdown of skin: Secondary | ICD-10-CM | POA: Diagnosis not present

## 2019-08-11 DIAGNOSIS — I110 Hypertensive heart disease with heart failure: Secondary | ICD-10-CM | POA: Diagnosis not present

## 2019-08-11 DIAGNOSIS — E11621 Type 2 diabetes mellitus with foot ulcer: Secondary | ICD-10-CM | POA: Diagnosis not present

## 2019-08-13 ENCOUNTER — Other Ambulatory Visit: Payer: Self-pay

## 2019-08-13 ENCOUNTER — Encounter: Payer: Self-pay | Admitting: Sports Medicine

## 2019-08-13 ENCOUNTER — Ambulatory Visit (INDEPENDENT_AMBULATORY_CARE_PROVIDER_SITE_OTHER): Payer: Medicare Other | Admitting: Sports Medicine

## 2019-08-13 DIAGNOSIS — M79671 Pain in right foot: Secondary | ICD-10-CM

## 2019-08-13 DIAGNOSIS — E1142 Type 2 diabetes mellitus with diabetic polyneuropathy: Secondary | ICD-10-CM

## 2019-08-13 DIAGNOSIS — M79672 Pain in left foot: Secondary | ICD-10-CM

## 2019-08-13 DIAGNOSIS — L97521 Non-pressure chronic ulcer of other part of left foot limited to breakdown of skin: Secondary | ICD-10-CM | POA: Diagnosis not present

## 2019-08-13 DIAGNOSIS — L84 Corns and callosities: Secondary | ICD-10-CM

## 2019-08-13 NOTE — Progress Notes (Signed)
Subjective: Craig Rollins is a 64 y.o. male patient seen in office for follow up evaluation of ulceration of the left  plantar foot.  Patient reports that the left foot is doing good no odor less drainage nurses have been coming weekly to change the dressing without any issues but does report that there is some tenderness at the callus on the right foot.  Pain is present when walking.  Patient denies nausea vomiting fever chills or any other constitutional symptoms at this time.  FBS 116 and last A1c 7.4  Patient Active Problem List   Diagnosis Date Noted  . Chest discomfort 08/13/2018  . CHF (congestive heart failure) ([place]) 08/13/2018  . COPD (chronic obstructive pulmonary disease) ([place]) 08/13/2018  . Open wound 08/13/2018  . Shortness of breath 08/13/2018  . CAD in native artery 02/18/2018  . Aortic stenosis 02/18/2018  . Essential hypertension 02/18/2018  . Foot swelling 01/26/2018  . Diabetes mellitus, type 2 ([place]) 01/26/2018  . Diabetic peripheral neuropathy ([place]) 02/25/2017  . Hammer toes of both feet 02/25/2017  . Risk for falls 09/23/2016  . NSTEMI (non-ST elevated myocardial infarction) ([place]) 08/30/2016  . Wound eschar of foot 08/21/2016  . Dilated cardiomyopathy ([place]) 07/16/2016  . Dyslipidemia (high LDL; low HDL) 07/16/2016  . Noncompliance 07/16/2016  . Smoking 07/16/2016  . Stroke ([place]) 12/19/2015   Current Outpatient Medications on File Prior to Visit  Medication Sig Dispense Refill  . albuterol (VENTOLIN HFA) 108 (90 Base) MCG/ACT inhaler Inhale into the lungs.    Marland Kitchen apixaban (ELIQUIS) 5 MG TABS tablet Take by mouth 2 (two) times daily.     Marland Kitchen aspirin EC 81 MG tablet Take 81 mg by mouth daily.    Marland Kitchen atorvastatin (LIPITOR) 40 MG tablet daily.     . carvedilol (COREG) 3.125 MG tablet Take 6.25 mg by mouth every 12 (twelve) hours.    . ciprofloxacin (CIPRO) 500 MG tablet Take 1 tablet (500 mg total) by mouth 2 (two) times daily. 20 tablet 0  . diphenhydrAMINE  (BENADRYL) 25 MG tablet Take 25 mg by mouth every 6 (six) hours as needed.    . furosemide (LASIX) 40 MG tablet Take 40 mg by mouth daily as needed.    Marland Kitchen glipiZIDE (GLUCOTROL) 5 MG tablet 2.5 mg 2 (two) times daily before a meal.     . lisinopril (ZESTRIL) 5 MG tablet Take 5 mg by mouth daily.    . metFORMIN (GLUCOPHAGE) 500 MG tablet TAKE 1 TABLET BY MOUTH EVERY TWELVE HOURS    . nitroGLYCERIN (NITROSTAT) 0.4 MG SL tablet PLACE 1 TABLET (0.4 MG TOTAL) UNDER THE TONGUE EVERY FIVE (5) MINUTES AS NEEDED FOR CHEST PAIN.    Marland Kitchen topiramate (TOPAMAX) 50 MG tablet Take 1 tablet (50 mg total) by mouth daily. 30 tablet 3  . triamcinolone cream (KENALOG) 0.1 % Apply 1 application topically 2 (two) times daily.     No current facility-administered medications on file prior to visit.   Allergies  Allergen Reactions  . Penicillins Nausea And Vomiting  . Milk Protein Itching and Hives  . Milk-Related Compounds Hives and Itching    Recent Results (from the past 2160 hour(s))  WOUND CULTURE     Status: Abnormal   Collection Time: 06/30/19 10:27 AM   Specimen: Foot, Left; Wound   WOUND CULTURE AND SENS  Result Value Ref Range   Gram Stain Result Final report    Organism ID, Bacteria Comment     Comment: No white  blood cells seen.   Organism ID, Bacteria Comment     Comment: Few gram positive cocci   Organism ID, Bacteria Comment     Comment: Few gram negative rods.   Aerobic Bacterial Culture Final report (A)    Organism ID, Bacteria Comment (A)     Comment: Beta hemolytic Streptococcus, group B Heavy growth Penicillin and ampicillin are drugs of choice for treatment of beta-hemolytic streptococcal infections. Susceptibility testing of penicillins and other beta-lactam agents approved by the FDA for treatment of beta-hemolytic streptococcal infections need not be performed routinely because nonsusceptible isolates are extremely rare in any beta-hemolytic streptococcus and have not been  reported for Streptococcus pyogenes (group A). (CLSI)    Organism ID, Bacteria Comment     Comment: Mixed skin flora including multiple gram negative rods. Heavy growth     Objective: There were no vitals filed for this visit.  General: Patient is awake, alert, oriented x 3 and in no acute distress.  Dermatology: Skin is warm and dry bilateral with significant dryness to lower extremities  Full thickness Sub met 5 on left ulceration measures 2.4 x 0.8 cm cm with fibrogranular base, there is no malodor, no erythema, no swelling warmth to the area noted.  Preulcerative callus plantar fifth metatarsal head on right with no acute signs of infection.  Dry skin to right heel with no signs of infection.  No other acute signs of infection.   Vascular: Pedal pulses are difficult to palpate due 1+ swelling.  Neurologic: Protective sensation absent bilateral.  Musculosketal: There is very mild pain with palpation to ulcerated at sub met 5 on right greater than left. No pain with compression to calves bilateral.  Patient is status post right fourth toe amputation which remains healed.   No results for input(s): GRAMSTAIN, LABORGA in the last 8760 hours.  Assessment and Plan:  Problem List Items Addressed This Visit      Endocrine   Diabetic peripheral neuropathy ([place])    Other Visit Diagnoses    Foot ulcer, left, limited to breakdown of skin ([place])    -  Primary   Left foot pain       Pre-ulcerative calluses       Right foot pain         -Examined patient and discussed the progression of the wounds and treatment alternatives. -Cleansed ulceration at sub met 5 on left - Excisionally dedbrided ulceration at sub met 5 on left to healthy bleeding borders removing nonviable tissue using a sterile chisel blade and tissue nipper to the level of the joint capsule. Wound measures post debridement as above. Wound was debrided to the level of the dermis with wound base exposed to promote  healing. Hemostasis was achieved with manuel pressure. Patient tolerated procedure well without any major discomfort or anesthesia necessary for this wound debridement.  -Applied small amount of Iodosorb and dry dressing and home nursing to use silver nitrate and or Iodosorb to the area and apply foot miracle cream to dry skin areas especially on the heels -Recommend to the right foot for home nursing to apply antibiotic cream and Band-Aid to prevent the preulcerative callus from worsening like I did at today's visit.  Applied offloading padding to right shoe to prevent excessive rubbing and worsening of preulcerative callus. - Advised patient to go to the ER or return to office if the wound worsens or if constitutional symptoms are present. -Patient to return to office in 3-4 weeks for follow up  care and evaluation or sooner if problems arise.  [name], DPM

## 2019-08-16 DIAGNOSIS — E1169 Type 2 diabetes mellitus with other specified complication: Secondary | ICD-10-CM | POA: Diagnosis not present

## 2019-08-16 DIAGNOSIS — E785 Hyperlipidemia, unspecified: Secondary | ICD-10-CM | POA: Diagnosis not present

## 2019-08-16 DIAGNOSIS — Z8673 Personal history of transient ischemic attack (TIA), and cerebral infarction without residual deficits: Secondary | ICD-10-CM | POA: Diagnosis not present

## 2019-08-16 DIAGNOSIS — I2699 Other pulmonary embolism without acute cor pulmonale: Secondary | ICD-10-CM | POA: Diagnosis not present

## 2019-08-17 DIAGNOSIS — J449 Chronic obstructive pulmonary disease, unspecified: Secondary | ICD-10-CM | POA: Diagnosis not present

## 2019-08-17 DIAGNOSIS — I2510 Atherosclerotic heart disease of native coronary artery without angina pectoris: Secondary | ICD-10-CM | POA: Diagnosis not present

## 2019-08-17 DIAGNOSIS — E11621 Type 2 diabetes mellitus with foot ulcer: Secondary | ICD-10-CM | POA: Diagnosis not present

## 2019-08-17 DIAGNOSIS — L97521 Non-pressure chronic ulcer of other part of left foot limited to breakdown of skin: Secondary | ICD-10-CM | POA: Diagnosis not present

## 2019-08-17 DIAGNOSIS — I110 Hypertensive heart disease with heart failure: Secondary | ICD-10-CM | POA: Diagnosis not present

## 2019-08-17 DIAGNOSIS — I509 Heart failure, unspecified: Secondary | ICD-10-CM | POA: Diagnosis not present

## 2019-08-19 DIAGNOSIS — J449 Chronic obstructive pulmonary disease, unspecified: Secondary | ICD-10-CM | POA: Diagnosis not present

## 2019-08-19 DIAGNOSIS — E11621 Type 2 diabetes mellitus with foot ulcer: Secondary | ICD-10-CM | POA: Diagnosis not present

## 2019-08-19 DIAGNOSIS — L97521 Non-pressure chronic ulcer of other part of left foot limited to breakdown of skin: Secondary | ICD-10-CM | POA: Diagnosis not present

## 2019-08-19 DIAGNOSIS — I509 Heart failure, unspecified: Secondary | ICD-10-CM | POA: Diagnosis not present

## 2019-08-19 DIAGNOSIS — I2510 Atherosclerotic heart disease of native coronary artery without angina pectoris: Secondary | ICD-10-CM | POA: Diagnosis not present

## 2019-08-19 DIAGNOSIS — I110 Hypertensive heart disease with heart failure: Secondary | ICD-10-CM | POA: Diagnosis not present

## 2019-08-23 DIAGNOSIS — J449 Chronic obstructive pulmonary disease, unspecified: Secondary | ICD-10-CM | POA: Diagnosis not present

## 2019-08-23 DIAGNOSIS — E11621 Type 2 diabetes mellitus with foot ulcer: Secondary | ICD-10-CM | POA: Diagnosis not present

## 2019-08-23 DIAGNOSIS — I2510 Atherosclerotic heart disease of native coronary artery without angina pectoris: Secondary | ICD-10-CM | POA: Diagnosis not present

## 2019-08-23 DIAGNOSIS — I509 Heart failure, unspecified: Secondary | ICD-10-CM | POA: Diagnosis not present

## 2019-08-23 DIAGNOSIS — I110 Hypertensive heart disease with heart failure: Secondary | ICD-10-CM | POA: Diagnosis not present

## 2019-08-23 DIAGNOSIS — L97521 Non-pressure chronic ulcer of other part of left foot limited to breakdown of skin: Secondary | ICD-10-CM | POA: Diagnosis not present

## 2019-08-24 DIAGNOSIS — Z23 Encounter for immunization: Secondary | ICD-10-CM | POA: Diagnosis not present

## 2019-08-25 DIAGNOSIS — N401 Enlarged prostate with lower urinary tract symptoms: Secondary | ICD-10-CM | POA: Diagnosis not present

## 2019-08-25 DIAGNOSIS — R972 Elevated prostate specific antigen [PSA]: Secondary | ICD-10-CM | POA: Diagnosis not present

## 2019-08-26 DIAGNOSIS — I2510 Atherosclerotic heart disease of native coronary artery without angina pectoris: Secondary | ICD-10-CM | POA: Diagnosis not present

## 2019-08-26 DIAGNOSIS — E11621 Type 2 diabetes mellitus with foot ulcer: Secondary | ICD-10-CM | POA: Diagnosis not present

## 2019-08-26 DIAGNOSIS — L97521 Non-pressure chronic ulcer of other part of left foot limited to breakdown of skin: Secondary | ICD-10-CM | POA: Diagnosis not present

## 2019-08-26 DIAGNOSIS — I509 Heart failure, unspecified: Secondary | ICD-10-CM | POA: Diagnosis not present

## 2019-08-26 DIAGNOSIS — I110 Hypertensive heart disease with heart failure: Secondary | ICD-10-CM | POA: Diagnosis not present

## 2019-08-26 DIAGNOSIS — J449 Chronic obstructive pulmonary disease, unspecified: Secondary | ICD-10-CM | POA: Diagnosis not present

## 2019-08-30 DIAGNOSIS — I2510 Atherosclerotic heart disease of native coronary artery without angina pectoris: Secondary | ICD-10-CM | POA: Diagnosis not present

## 2019-08-30 DIAGNOSIS — E11621 Type 2 diabetes mellitus with foot ulcer: Secondary | ICD-10-CM | POA: Diagnosis not present

## 2019-08-30 DIAGNOSIS — L97521 Non-pressure chronic ulcer of other part of left foot limited to breakdown of skin: Secondary | ICD-10-CM | POA: Diagnosis not present

## 2019-08-30 DIAGNOSIS — I509 Heart failure, unspecified: Secondary | ICD-10-CM | POA: Diagnosis not present

## 2019-08-30 DIAGNOSIS — J449 Chronic obstructive pulmonary disease, unspecified: Secondary | ICD-10-CM | POA: Diagnosis not present

## 2019-08-30 DIAGNOSIS — I110 Hypertensive heart disease with heart failure: Secondary | ICD-10-CM | POA: Diagnosis not present

## 2019-09-01 DIAGNOSIS — I110 Hypertensive heart disease with heart failure: Secondary | ICD-10-CM | POA: Diagnosis not present

## 2019-09-01 DIAGNOSIS — L97521 Non-pressure chronic ulcer of other part of left foot limited to breakdown of skin: Secondary | ICD-10-CM | POA: Diagnosis not present

## 2019-09-01 DIAGNOSIS — I2510 Atherosclerotic heart disease of native coronary artery without angina pectoris: Secondary | ICD-10-CM | POA: Diagnosis not present

## 2019-09-01 DIAGNOSIS — I509 Heart failure, unspecified: Secondary | ICD-10-CM | POA: Diagnosis not present

## 2019-09-01 DIAGNOSIS — J449 Chronic obstructive pulmonary disease, unspecified: Secondary | ICD-10-CM | POA: Diagnosis not present

## 2019-09-01 DIAGNOSIS — E11621 Type 2 diabetes mellitus with foot ulcer: Secondary | ICD-10-CM | POA: Diagnosis not present

## 2019-09-02 ENCOUNTER — Ambulatory Visit (INDEPENDENT_AMBULATORY_CARE_PROVIDER_SITE_OTHER): Payer: Medicare Other | Admitting: Sports Medicine

## 2019-09-02 ENCOUNTER — Encounter: Payer: Self-pay | Admitting: Sports Medicine

## 2019-09-02 ENCOUNTER — Other Ambulatory Visit: Payer: Self-pay

## 2019-09-02 DIAGNOSIS — L97521 Non-pressure chronic ulcer of other part of left foot limited to breakdown of skin: Secondary | ICD-10-CM

## 2019-09-02 DIAGNOSIS — M79671 Pain in right foot: Secondary | ICD-10-CM

## 2019-09-02 DIAGNOSIS — L84 Corns and callosities: Secondary | ICD-10-CM

## 2019-09-02 DIAGNOSIS — M79672 Pain in left foot: Secondary | ICD-10-CM

## 2019-09-02 DIAGNOSIS — E1142 Type 2 diabetes mellitus with diabetic polyneuropathy: Secondary | ICD-10-CM

## 2019-09-02 NOTE — Progress Notes (Signed)
Subjective: Craig Rollins is a 64 y.o. male patient seen in office for follow up evaluation of ulceration of the left  plantar foot.  Patient reports that the left foot is doing good draining less and appears to be healing up.  Patient reports that the pad that I placed in his right shoe last visit has also helped greatly with the pain he was having on the right foot.  Nurses have been assisting with dressing changes.  Patient denies nausea vomiting fever chills or any other constitutional symptoms at this time.  FBS 128 and last A1c 7.1  Patient Active Problem List   Diagnosis Date Noted  . Chest discomfort 08/13/2018  . CHF (congestive heart failure) ([place]) 08/13/2018  . COPD (chronic obstructive pulmonary disease) ([place]) 08/13/2018  . Open wound 08/13/2018  . Shortness of breath 08/13/2018  . CAD in native artery 02/18/2018  . Aortic stenosis 02/18/2018  . Essential hypertension 02/18/2018  . Foot swelling 01/26/2018  . Diabetes mellitus, type 2 ([place]) 01/26/2018  . Diabetic peripheral neuropathy ([place]) 02/25/2017  . Hammer toes of both feet 02/25/2017  . Risk for falls 09/23/2016  . NSTEMI (non-ST elevated myocardial infarction) ([place]) 08/30/2016  . Wound eschar of foot 08/21/2016  . Dilated cardiomyopathy ([place]) 07/16/2016  . Dyslipidemia (high LDL; low HDL) 07/16/2016  . Noncompliance 07/16/2016  . Smoking 07/16/2016  . Stroke ([place]) 12/19/2015   Current Outpatient Medications on File Prior to Visit  Medication Sig Dispense Refill  . albuterol (VENTOLIN HFA) 108 (90 Base) MCG/ACT inhaler Inhale into the lungs.    Marland Kitchen apixaban (ELIQUIS) 5 MG TABS tablet Take by mouth 2 (two) times daily.     Marland Kitchen aspirin EC 81 MG tablet Take 81 mg by mouth daily.    Marland Kitchen atorvastatin (LIPITOR) 40 MG tablet daily.     . carvedilol (COREG) 3.125 MG tablet Take 6.25 mg by mouth every 12 (twelve) hours.    . ciprofloxacin (CIPRO) 500 MG tablet Take 1 tablet (500 mg total) by mouth 2 (two) times daily.  20 tablet 0  . diphenhydrAMINE (BENADRYL) 25 MG tablet Take 25 mg by mouth every 6 (six) hours as needed.    . furosemide (LASIX) 40 MG tablet Take 40 mg by mouth daily as needed.    Marland Kitchen glipiZIDE (GLUCOTROL) 5 MG tablet 2.5 mg 2 (two) times daily before a meal.     . lisinopril (ZESTRIL) 5 MG tablet Take 5 mg by mouth daily.    . metFORMIN (GLUCOPHAGE) 500 MG tablet TAKE 1 TABLET BY MOUTH EVERY TWELVE HOURS    . nitroGLYCERIN (NITROSTAT) 0.4 MG SL tablet PLACE 1 TABLET (0.4 MG TOTAL) UNDER THE TONGUE EVERY FIVE (5) MINUTES AS NEEDED FOR CHEST PAIN.    Marland Kitchen topiramate (TOPAMAX) 50 MG tablet Take 1 tablet (50 mg total) by mouth daily. 30 tablet 3  . triamcinolone cream (KENALOG) 0.1 % Apply 1 application topically 2 (two) times daily.     No current facility-administered medications on file prior to visit.   Allergies  Allergen Reactions  . Penicillins Nausea And Vomiting  . Milk Protein Itching and Hives  . Milk-Related Compounds Hives and Itching    Recent Results (from the past 2160 hour(s))  WOUND CULTURE     Status: Abnormal   Collection Time: 06/30/19 10:27 AM   Specimen: Foot, Left; Wound   WOUND CULTURE AND SENS  Result Value Ref Range   Gram Stain Result Final report    Organism ID, Bacteria  Comment     Comment: No white blood cells seen.   Organism ID, Bacteria Comment     Comment: Few gram positive cocci   Organism ID, Bacteria Comment     Comment: Few gram negative rods.   Aerobic Bacterial Culture Final report (A)    Organism ID, Bacteria Comment (A)     Comment: Beta hemolytic Streptococcus, group B Heavy growth Penicillin and ampicillin are drugs of choice for treatment of beta-hemolytic streptococcal infections. Susceptibility testing of penicillins and other beta-lactam agents approved by the FDA for treatment of beta-hemolytic streptococcal infections need not be performed routinely because nonsusceptible isolates are extremely rare in any beta-hemolytic  streptococcus and have not been reported for Streptococcus pyogenes (group A). (CLSI)    Organism ID, Bacteria Comment     Comment: Mixed skin flora including multiple gram negative rods. Heavy growth     Objective: There were no vitals filed for this visit.  General: Patient is awake, alert, oriented x 3 and in no acute distress.  Dermatology: Skin is warm and dry bilateral with significant dryness to lower extremities  Full thickness Sub met 5 on left ulceration measures 2.4 x 0.8 cm cm with fibrogranular base, there is no malodor, no erythema, no swelling warmth to the area noted.  Preulcerative callus plantar fifth metatarsal head on right with no acute signs of infection.  Dry skin to right heel with no signs of infection.  No other acute signs of infection.   Vascular: Pedal pulses are difficult to palpate due 1+ swelling.  Neurologic: Protective sensation absent bilateral.  Musculosketal: There is very mild pain with palpation to ulcerated at sub met 5 on right greater than left. No pain with compression to calves bilateral.  Patient is status post right fourth toe amputation which remains healed.   No results for input(s): GRAMSTAIN, LABORGA in the last 8760 hours.  Assessment and Plan:  Problem List Items Addressed This Visit      Endocrine   Diabetic peripheral neuropathy ([place])    Other Visit Diagnoses    Foot ulcer, left, limited to breakdown of skin ([place])    -  Primary   Left foot pain       Pre-ulcerative calluses       Right foot pain         -Examined patient and discussed the progression of the wounds and treatment alternatives. -Cleansed ulceration at sub met 5 on left - Excisionally dedbrided ulceration at sub met 5 on left to healthy bleeding borders removing nonviable tissue using a sterile chisel blade and tissue nipper to the level of the joint capsule. Wound measures post debridement as above. Wound was debrided to the level of the dermis with  wound base exposed to promote healing. Hemostasis was achieved with manuel pressure. Patient tolerated procedure well without any major discomfort or anesthesia necessary for this wound debridement.  -Applied small amount of Iodosorb and dry dressing and home nursing to use silver nitrate and or Iodosorb to the area like before and apply foot miracle cream to dry skin areas especially on the heels like before to help with dry skin -Recommend continue with offloading padding on the right shoe to prevent rubbing at preulcerative callus at no additional charge today mechanically debrided callus without incident using a sterile chisel blade - Advised patient to go to the ER or return to office if the wound worsens or if constitutional symptoms are present. -Patient to return to office in 3-4  weeks for follow up care and evaluation or sooner if problems arise.  [name], DPM

## 2019-09-03 ENCOUNTER — Telehealth: Payer: Self-pay

## 2019-09-03 DIAGNOSIS — I2510 Atherosclerotic heart disease of native coronary artery without angina pectoris: Secondary | ICD-10-CM | POA: Diagnosis not present

## 2019-09-03 DIAGNOSIS — M2042 Other hammer toe(s) (acquired), left foot: Secondary | ICD-10-CM | POA: Diagnosis not present

## 2019-09-03 DIAGNOSIS — Z7982 Long term (current) use of aspirin: Secondary | ICD-10-CM | POA: Diagnosis not present

## 2019-09-03 DIAGNOSIS — Z7984 Long term (current) use of oral hypoglycemic drugs: Secondary | ICD-10-CM | POA: Diagnosis not present

## 2019-09-03 DIAGNOSIS — Z8673 Personal history of transient ischemic attack (TIA), and cerebral infarction without residual deficits: Secondary | ICD-10-CM | POA: Diagnosis not present

## 2019-09-03 DIAGNOSIS — E11621 Type 2 diabetes mellitus with foot ulcer: Secondary | ICD-10-CM | POA: Diagnosis not present

## 2019-09-03 DIAGNOSIS — E785 Hyperlipidemia, unspecified: Secondary | ICD-10-CM | POA: Diagnosis not present

## 2019-09-03 DIAGNOSIS — Z79899 Other long term (current) drug therapy: Secondary | ICD-10-CM | POA: Diagnosis not present

## 2019-09-03 DIAGNOSIS — I252 Old myocardial infarction: Secondary | ICD-10-CM | POA: Diagnosis not present

## 2019-09-03 DIAGNOSIS — L97521 Non-pressure chronic ulcer of other part of left foot limited to breakdown of skin: Secondary | ICD-10-CM | POA: Diagnosis not present

## 2019-09-03 DIAGNOSIS — E1142 Type 2 diabetes mellitus with diabetic polyneuropathy: Secondary | ICD-10-CM | POA: Diagnosis not present

## 2019-09-03 DIAGNOSIS — Z89421 Acquired absence of other right toe(s): Secondary | ICD-10-CM | POA: Diagnosis not present

## 2019-09-03 DIAGNOSIS — M2041 Other hammer toe(s) (acquired), right foot: Secondary | ICD-10-CM | POA: Diagnosis not present

## 2019-09-03 DIAGNOSIS — I110 Hypertensive heart disease with heart failure: Secondary | ICD-10-CM | POA: Diagnosis not present

## 2019-09-03 DIAGNOSIS — M1991 Primary osteoarthritis, unspecified site: Secondary | ICD-10-CM | POA: Diagnosis not present

## 2019-09-03 DIAGNOSIS — F17210 Nicotine dependence, cigarettes, uncomplicated: Secondary | ICD-10-CM | POA: Diagnosis not present

## 2019-09-03 DIAGNOSIS — J449 Chronic obstructive pulmonary disease, unspecified: Secondary | ICD-10-CM | POA: Diagnosis not present

## 2019-09-03 DIAGNOSIS — R32 Unspecified urinary incontinence: Secondary | ICD-10-CM | POA: Diagnosis not present

## 2019-09-03 DIAGNOSIS — Z7902 Long term (current) use of antithrombotics/antiplatelets: Secondary | ICD-10-CM | POA: Diagnosis not present

## 2019-09-03 DIAGNOSIS — I509 Heart failure, unspecified: Secondary | ICD-10-CM | POA: Diagnosis not present

## 2019-09-03 NOTE — Telephone Encounter (Signed)
Ok great.

## 2019-09-03 NOTE — Telephone Encounter (Signed)
Kathrine Haddock from [facility] called stating they got pt pre-certified for would care for twice/week

## 2019-09-06 DIAGNOSIS — I509 Heart failure, unspecified: Secondary | ICD-10-CM | POA: Diagnosis not present

## 2019-09-06 DIAGNOSIS — E11621 Type 2 diabetes mellitus with foot ulcer: Secondary | ICD-10-CM | POA: Diagnosis not present

## 2019-09-06 DIAGNOSIS — L97521 Non-pressure chronic ulcer of other part of left foot limited to breakdown of skin: Secondary | ICD-10-CM | POA: Diagnosis not present

## 2019-09-06 DIAGNOSIS — I2510 Atherosclerotic heart disease of native coronary artery without angina pectoris: Secondary | ICD-10-CM | POA: Diagnosis not present

## 2019-09-06 DIAGNOSIS — J449 Chronic obstructive pulmonary disease, unspecified: Secondary | ICD-10-CM | POA: Diagnosis not present

## 2019-09-06 DIAGNOSIS — I110 Hypertensive heart disease with heart failure: Secondary | ICD-10-CM | POA: Diagnosis not present

## 2019-09-09 DIAGNOSIS — L97521 Non-pressure chronic ulcer of other part of left foot limited to breakdown of skin: Secondary | ICD-10-CM | POA: Diagnosis not present

## 2019-09-09 DIAGNOSIS — J449 Chronic obstructive pulmonary disease, unspecified: Secondary | ICD-10-CM | POA: Diagnosis not present

## 2019-09-09 DIAGNOSIS — E11621 Type 2 diabetes mellitus with foot ulcer: Secondary | ICD-10-CM | POA: Diagnosis not present

## 2019-09-09 DIAGNOSIS — I110 Hypertensive heart disease with heart failure: Secondary | ICD-10-CM | POA: Diagnosis not present

## 2019-09-09 DIAGNOSIS — I2510 Atherosclerotic heart disease of native coronary artery without angina pectoris: Secondary | ICD-10-CM | POA: Diagnosis not present

## 2019-09-09 DIAGNOSIS — I509 Heart failure, unspecified: Secondary | ICD-10-CM | POA: Diagnosis not present

## 2019-09-13 DIAGNOSIS — E11621 Type 2 diabetes mellitus with foot ulcer: Secondary | ICD-10-CM | POA: Diagnosis not present

## 2019-09-13 DIAGNOSIS — I110 Hypertensive heart disease with heart failure: Secondary | ICD-10-CM | POA: Diagnosis not present

## 2019-09-13 DIAGNOSIS — J449 Chronic obstructive pulmonary disease, unspecified: Secondary | ICD-10-CM | POA: Diagnosis not present

## 2019-09-13 DIAGNOSIS — L97521 Non-pressure chronic ulcer of other part of left foot limited to breakdown of skin: Secondary | ICD-10-CM | POA: Diagnosis not present

## 2019-09-13 DIAGNOSIS — I2510 Atherosclerotic heart disease of native coronary artery without angina pectoris: Secondary | ICD-10-CM | POA: Diagnosis not present

## 2019-09-13 DIAGNOSIS — I509 Heart failure, unspecified: Secondary | ICD-10-CM | POA: Diagnosis not present

## 2019-09-16 DIAGNOSIS — E11621 Type 2 diabetes mellitus with foot ulcer: Secondary | ICD-10-CM | POA: Diagnosis not present

## 2019-09-16 DIAGNOSIS — I2510 Atherosclerotic heart disease of native coronary artery without angina pectoris: Secondary | ICD-10-CM | POA: Diagnosis not present

## 2019-09-16 DIAGNOSIS — J449 Chronic obstructive pulmonary disease, unspecified: Secondary | ICD-10-CM | POA: Diagnosis not present

## 2019-09-16 DIAGNOSIS — I509 Heart failure, unspecified: Secondary | ICD-10-CM | POA: Diagnosis not present

## 2019-09-16 DIAGNOSIS — L97521 Non-pressure chronic ulcer of other part of left foot limited to breakdown of skin: Secondary | ICD-10-CM | POA: Diagnosis not present

## 2019-09-16 DIAGNOSIS — I110 Hypertensive heart disease with heart failure: Secondary | ICD-10-CM | POA: Diagnosis not present

## 2019-09-20 DIAGNOSIS — I110 Hypertensive heart disease with heart failure: Secondary | ICD-10-CM | POA: Diagnosis not present

## 2019-09-20 DIAGNOSIS — E11621 Type 2 diabetes mellitus with foot ulcer: Secondary | ICD-10-CM | POA: Diagnosis not present

## 2019-09-20 DIAGNOSIS — I2510 Atherosclerotic heart disease of native coronary artery without angina pectoris: Secondary | ICD-10-CM | POA: Diagnosis not present

## 2019-09-20 DIAGNOSIS — L97521 Non-pressure chronic ulcer of other part of left foot limited to breakdown of skin: Secondary | ICD-10-CM | POA: Diagnosis not present

## 2019-09-20 DIAGNOSIS — I509 Heart failure, unspecified: Secondary | ICD-10-CM | POA: Diagnosis not present

## 2019-09-20 DIAGNOSIS — J449 Chronic obstructive pulmonary disease, unspecified: Secondary | ICD-10-CM | POA: Diagnosis not present

## 2019-09-22 ENCOUNTER — Telehealth: Payer: Self-pay

## 2019-09-22 ENCOUNTER — Ambulatory Visit (INDEPENDENT_AMBULATORY_CARE_PROVIDER_SITE_OTHER): Payer: Medicare Other | Admitting: Sports Medicine

## 2019-09-22 ENCOUNTER — Encounter: Payer: Self-pay | Admitting: Sports Medicine

## 2019-09-22 ENCOUNTER — Other Ambulatory Visit: Payer: Self-pay

## 2019-09-22 DIAGNOSIS — M79672 Pain in left foot: Secondary | ICD-10-CM

## 2019-09-22 DIAGNOSIS — L97521 Non-pressure chronic ulcer of other part of left foot limited to breakdown of skin: Secondary | ICD-10-CM | POA: Diagnosis not present

## 2019-09-22 DIAGNOSIS — E1142 Type 2 diabetes mellitus with diabetic polyneuropathy: Secondary | ICD-10-CM | POA: Diagnosis not present

## 2019-09-22 NOTE — Telephone Encounter (Signed)
Patient should go to [facility] or [facility] (department of human and health services) and they can help him with applying and getting a Education officer, museum

## 2019-09-22 NOTE — Progress Notes (Signed)
Subjective: Craig Rollins is a 64 y.o. male patient seen in office for follow up evaluation of ulceration of the left  plantar foot.  Patient reports that the left foot is doing good.  Nurse has been helping to change dressing. Patient denies nausea vomiting fever chills or any other constitutional symptoms at this time.  FBS 147 and last A1c 7.1  Patient Active Problem List   Diagnosis Date Noted  . Chest discomfort 08/13/2018  . CHF (congestive heart failure) ([place]) 08/13/2018  . COPD (chronic obstructive pulmonary disease) ([place]) 08/13/2018  . Open wound 08/13/2018  . Shortness of breath 08/13/2018  . CAD in native artery 02/18/2018  . Aortic stenosis 02/18/2018  . Essential hypertension 02/18/2018  . Foot swelling 01/26/2018  . Diabetes mellitus, type 2 ([place]) 01/26/2018  . Diabetic peripheral neuropathy ([place]) 02/25/2017  . Hammer toes of both feet 02/25/2017  . Risk for falls 09/23/2016  . NSTEMI (non-ST elevated myocardial infarction) ([place]) 08/30/2016  . Wound eschar of foot 08/21/2016  . Dilated cardiomyopathy ([place]) 07/16/2016  . Dyslipidemia (high LDL; low HDL) 07/16/2016  . Noncompliance 07/16/2016  . Smoking 07/16/2016  . Stroke ([place]) 12/19/2015   Current Outpatient Medications on File Prior to Visit  Medication Sig Dispense Refill  . albuterol (VENTOLIN HFA) 108 (90 Base) MCG/ACT inhaler Inhale into the lungs.    Marland Kitchen apixaban (ELIQUIS) 5 MG TABS tablet Take by mouth 2 (two) times daily.     Marland Kitchen aspirin EC 81 MG tablet Take 81 mg by mouth daily.    Marland Kitchen atorvastatin (LIPITOR) 40 MG tablet daily.     . carvedilol (COREG) 3.125 MG tablet Take 6.25 mg by mouth every 12 (twelve) hours.    . ciprofloxacin (CIPRO) 500 MG tablet Take 1 tablet (500 mg total) by mouth 2 (two) times daily. 20 tablet 0  . diphenhydrAMINE (BENADRYL) 25 MG tablet Take 25 mg by mouth every 6 (six) hours as needed.    . furosemide (LASIX) 40 MG tablet Take 40 mg by mouth daily as needed.    Marland Kitchen  glipiZIDE (GLUCOTROL) 5 MG tablet 2.5 mg 2 (two) times daily before a meal.     . lisinopril (ZESTRIL) 5 MG tablet Take 5 mg by mouth daily.    . metFORMIN (GLUCOPHAGE) 500 MG tablet TAKE 1 TABLET BY MOUTH EVERY TWELVE HOURS    . nitroGLYCERIN (NITROSTAT) 0.4 MG SL tablet PLACE 1 TABLET (0.4 MG TOTAL) UNDER THE TONGUE EVERY FIVE (5) MINUTES AS NEEDED FOR CHEST PAIN.    Marland Kitchen topiramate (TOPAMAX) 50 MG tablet Take 1 tablet (50 mg total) by mouth daily. 30 tablet 3  . triamcinolone cream (KENALOG) 0.1 % Apply 1 application topically 2 (two) times daily.     No current facility-administered medications on file prior to visit.   Allergies  Allergen Reactions  . Penicillins Nausea And Vomiting  . Milk Protein Itching and Hives  . Milk-Related Compounds Hives and Itching    Recent Results (from the past 2160 hour(s))  WOUND CULTURE     Status: Abnormal   Collection Time: 06/30/19 10:27 AM   Specimen: Foot, Left; Wound   WOUND CULTURE AND SENS  Result Value Ref Range   Gram Stain Result Final report    Organism ID, Bacteria Comment     Comment: No white blood cells seen.   Organism ID, Bacteria Comment     Comment: Few gram positive cocci   Organism ID, Bacteria Comment     Comment: Few  gram negative rods.   Aerobic Bacterial Culture Final report (A)    Organism ID, Bacteria Comment (A)     Comment: Beta hemolytic Streptococcus, group B Heavy growth Penicillin and ampicillin are drugs of choice for treatment of beta-hemolytic streptococcal infections. Susceptibility testing of penicillins and other beta-lactam agents approved by the FDA for treatment of beta-hemolytic streptococcal infections need not be performed routinely because nonsusceptible isolates are extremely rare in any beta-hemolytic streptococcus and have not been reported for Streptococcus pyogenes (group A). (CLSI)    Organism ID, Bacteria Comment     Comment: Mixed skin flora including multiple gram negative  rods. Heavy growth     Objective: There were no vitals filed for this visit.  General: Patient is awake, alert, oriented x 3 and in no acute distress.  Dermatology: Skin is warm and dry bilateral with significant dryness to lower extremities  Partial thickness Sub met 5 on left ulceration measures 1 x 0.2 cm with fibrogranular base, there is no malodor, no erythema, no swelling warmth to the area noted.  Preulcerative callus plantar fifth metatarsal head on right with no acute signs of infection.  Dry skin to right heel with no signs of infection.  No other acute signs of infection.   Vascular: Pedal pulses are difficult to palpate due 1+ swelling.  Neurologic: Protective sensation absent bilateral.  Musculosketal: There is mininmal pain with palpation to ulcerated at sub met 5 on right greater than left. No pain with compression to calves bilateral.  Patient is status post right fourth toe amputation which remains healed.   No results for input(s): GRAMSTAIN, LABORGA in the last 8760 hours.  Assessment and Plan:  Problem List Items Addressed This Visit      Endocrine   Diabetic peripheral neuropathy ([place])    Other Visit Diagnoses    Foot ulcer, left, limited to breakdown of skin ([place])    -  Primary   Left foot pain         -Examined patient and discussed the progression of the wounds and treatment alternatives. -Cleansed ulceration at sub met 5 on left - Excisionally dedbrided ulceration at sub met 5 on left to healthy bleeding borders removing nonviable tissue using a sterile chisel blade and tissue nipper to the level of the joint capsule. Wound measures post debridement as above. Wound was debrided to the level of the dermis with wound base exposed to promote healing. Hemostasis was achieved with manuel pressure. Patient tolerated procedure well without any major discomfort or anesthesia necessary for this wound debridement.  -Applied small amount of Iodosorb and  mepitel ag and home nursing to use silver nitrate and or Iodosorb to the area like before and apply foot miracle cream to dry skin areas especially on the heels like before to help with dry skin like before -Recommend continue with offloading padding on the right shoe - Advised patient to go to the ER or return to office if the wound worsens or if constitutional symptoms are present. -Patient to return to office in 3-4 weeks for follow up care and evaluation or sooner if problems arise.  [name], DPM

## 2019-09-22 NOTE — Telephone Encounter (Signed)
Gael Price-[facility] nurse  Called stating pt needs help with food stamps and needs a Education officer, museum. Please advice

## 2019-09-22 NOTE — Telephone Encounter (Signed)
Baker Janus informed of Dr. Leeanne Rio approval for social worker

## 2019-09-23 DIAGNOSIS — E11621 Type 2 diabetes mellitus with foot ulcer: Secondary | ICD-10-CM | POA: Diagnosis not present

## 2019-09-23 DIAGNOSIS — L97521 Non-pressure chronic ulcer of other part of left foot limited to breakdown of skin: Secondary | ICD-10-CM | POA: Diagnosis not present

## 2019-09-23 DIAGNOSIS — I110 Hypertensive heart disease with heart failure: Secondary | ICD-10-CM | POA: Diagnosis not present

## 2019-09-23 DIAGNOSIS — I509 Heart failure, unspecified: Secondary | ICD-10-CM | POA: Diagnosis not present

## 2019-09-23 DIAGNOSIS — I2510 Atherosclerotic heart disease of native coronary artery without angina pectoris: Secondary | ICD-10-CM | POA: Diagnosis not present

## 2019-09-23 DIAGNOSIS — J449 Chronic obstructive pulmonary disease, unspecified: Secondary | ICD-10-CM | POA: Diagnosis not present

## 2019-09-27 DIAGNOSIS — I509 Heart failure, unspecified: Secondary | ICD-10-CM | POA: Diagnosis not present

## 2019-09-27 DIAGNOSIS — E11621 Type 2 diabetes mellitus with foot ulcer: Secondary | ICD-10-CM | POA: Diagnosis not present

## 2019-09-27 DIAGNOSIS — I2510 Atherosclerotic heart disease of native coronary artery without angina pectoris: Secondary | ICD-10-CM | POA: Diagnosis not present

## 2019-09-27 DIAGNOSIS — I110 Hypertensive heart disease with heart failure: Secondary | ICD-10-CM | POA: Diagnosis not present

## 2019-09-27 DIAGNOSIS — J449 Chronic obstructive pulmonary disease, unspecified: Secondary | ICD-10-CM | POA: Diagnosis not present

## 2019-09-27 DIAGNOSIS — L97521 Non-pressure chronic ulcer of other part of left foot limited to breakdown of skin: Secondary | ICD-10-CM | POA: Diagnosis not present

## 2019-09-30 DIAGNOSIS — J449 Chronic obstructive pulmonary disease, unspecified: Secondary | ICD-10-CM | POA: Diagnosis not present

## 2019-09-30 DIAGNOSIS — I2510 Atherosclerotic heart disease of native coronary artery without angina pectoris: Secondary | ICD-10-CM | POA: Diagnosis not present

## 2019-09-30 DIAGNOSIS — I509 Heart failure, unspecified: Secondary | ICD-10-CM | POA: Diagnosis not present

## 2019-09-30 DIAGNOSIS — L97521 Non-pressure chronic ulcer of other part of left foot limited to breakdown of skin: Secondary | ICD-10-CM | POA: Diagnosis not present

## 2019-09-30 DIAGNOSIS — E11621 Type 2 diabetes mellitus with foot ulcer: Secondary | ICD-10-CM | POA: Diagnosis not present

## 2019-09-30 DIAGNOSIS — I110 Hypertensive heart disease with heart failure: Secondary | ICD-10-CM | POA: Diagnosis not present

## 2019-10-03 DIAGNOSIS — Z79899 Other long term (current) drug therapy: Secondary | ICD-10-CM | POA: Diagnosis not present

## 2019-10-03 DIAGNOSIS — I509 Heart failure, unspecified: Secondary | ICD-10-CM | POA: Diagnosis not present

## 2019-10-03 DIAGNOSIS — M2041 Other hammer toe(s) (acquired), right foot: Secondary | ICD-10-CM | POA: Diagnosis not present

## 2019-10-03 DIAGNOSIS — M2042 Other hammer toe(s) (acquired), left foot: Secondary | ICD-10-CM | POA: Diagnosis not present

## 2019-10-03 DIAGNOSIS — E785 Hyperlipidemia, unspecified: Secondary | ICD-10-CM | POA: Diagnosis not present

## 2019-10-03 DIAGNOSIS — F17210 Nicotine dependence, cigarettes, uncomplicated: Secondary | ICD-10-CM | POA: Diagnosis not present

## 2019-10-03 DIAGNOSIS — E11621 Type 2 diabetes mellitus with foot ulcer: Secondary | ICD-10-CM | POA: Diagnosis not present

## 2019-10-03 DIAGNOSIS — E1142 Type 2 diabetes mellitus with diabetic polyneuropathy: Secondary | ICD-10-CM | POA: Diagnosis not present

## 2019-10-03 DIAGNOSIS — M1991 Primary osteoarthritis, unspecified site: Secondary | ICD-10-CM | POA: Diagnosis not present

## 2019-10-03 DIAGNOSIS — Z8673 Personal history of transient ischemic attack (TIA), and cerebral infarction without residual deficits: Secondary | ICD-10-CM | POA: Diagnosis not present

## 2019-10-03 DIAGNOSIS — Z7902 Long term (current) use of antithrombotics/antiplatelets: Secondary | ICD-10-CM | POA: Diagnosis not present

## 2019-10-03 DIAGNOSIS — Z7982 Long term (current) use of aspirin: Secondary | ICD-10-CM | POA: Diagnosis not present

## 2019-10-03 DIAGNOSIS — I110 Hypertensive heart disease with heart failure: Secondary | ICD-10-CM | POA: Diagnosis not present

## 2019-10-03 DIAGNOSIS — I2510 Atherosclerotic heart disease of native coronary artery without angina pectoris: Secondary | ICD-10-CM | POA: Diagnosis not present

## 2019-10-03 DIAGNOSIS — I252 Old myocardial infarction: Secondary | ICD-10-CM | POA: Diagnosis not present

## 2019-10-03 DIAGNOSIS — R32 Unspecified urinary incontinence: Secondary | ICD-10-CM | POA: Diagnosis not present

## 2019-10-03 DIAGNOSIS — L97521 Non-pressure chronic ulcer of other part of left foot limited to breakdown of skin: Secondary | ICD-10-CM | POA: Diagnosis not present

## 2019-10-03 DIAGNOSIS — J449 Chronic obstructive pulmonary disease, unspecified: Secondary | ICD-10-CM | POA: Diagnosis not present

## 2019-10-03 DIAGNOSIS — Z89421 Acquired absence of other right toe(s): Secondary | ICD-10-CM | POA: Diagnosis not present

## 2019-10-03 DIAGNOSIS — Z7984 Long term (current) use of oral hypoglycemic drugs: Secondary | ICD-10-CM | POA: Diagnosis not present

## 2019-10-06 DIAGNOSIS — R200 Anesthesia of skin: Secondary | ICD-10-CM | POA: Diagnosis not present

## 2019-10-06 DIAGNOSIS — R202 Paresthesia of skin: Secondary | ICD-10-CM | POA: Diagnosis not present

## 2019-10-07 ENCOUNTER — Other Ambulatory Visit: Payer: Self-pay

## 2019-10-07 ENCOUNTER — Ambulatory Visit (INDEPENDENT_AMBULATORY_CARE_PROVIDER_SITE_OTHER): Payer: Medicare Other | Admitting: Sports Medicine

## 2019-10-07 ENCOUNTER — Encounter: Payer: Self-pay | Admitting: Sports Medicine

## 2019-10-07 DIAGNOSIS — L97521 Non-pressure chronic ulcer of other part of left foot limited to breakdown of skin: Secondary | ICD-10-CM | POA: Diagnosis not present

## 2019-10-07 DIAGNOSIS — M79672 Pain in left foot: Secondary | ICD-10-CM

## 2019-10-07 DIAGNOSIS — E1142 Type 2 diabetes mellitus with diabetic polyneuropathy: Secondary | ICD-10-CM

## 2019-10-07 NOTE — Progress Notes (Signed)
Subjective: Craig Rollins is a 64 y.o. male patient seen in office for follow up evaluation of ulceration of the left  plantar foot.  Patient reports that the left foot is doing good no pain or issues.  Nurse has been helping to change dressing using Iodosorb and silver nitrate. Patient denies nausea vomiting fever chills or any other constitutional symptoms at this time.  FBS 117 and last A1c 7.1  Patient Active Problem List   Diagnosis Date Noted  . Chest discomfort 08/13/2018  . CHF (congestive heart failure) ([place]) 08/13/2018  . COPD (chronic obstructive pulmonary disease) ([place]) 08/13/2018  . Open wound 08/13/2018  . Shortness of breath 08/13/2018  . CAD in native artery 02/18/2018  . Aortic stenosis 02/18/2018  . Essential hypertension 02/18/2018  . Foot swelling 01/26/2018  . Diabetes mellitus, type 2 ([place]) 01/26/2018  . Diabetic peripheral neuropathy ([place]) 02/25/2017  . Hammer toes of both feet 02/25/2017  . Risk for falls 09/23/2016  . NSTEMI (non-ST elevated myocardial infarction) ([place]) 08/30/2016  . Wound eschar of foot 08/21/2016  . Dilated cardiomyopathy ([place]) 07/16/2016  . Dyslipidemia (high LDL; low HDL) 07/16/2016  . Noncompliance 07/16/2016  . Smoking 07/16/2016  . Stroke ([place]) 12/19/2015   Current Outpatient Medications on File Prior to Visit  Medication Sig Dispense Refill  . albuterol (VENTOLIN HFA) 108 (90 Base) MCG/ACT inhaler Inhale into the lungs.    Marland Kitchen apixaban (ELIQUIS) 5 MG TABS tablet Take by mouth 2 (two) times daily.     Marland Kitchen aspirin EC 81 MG tablet Take 81 mg by mouth daily.    Marland Kitchen atorvastatin (LIPITOR) 40 MG tablet daily.     . carvedilol (COREG) 3.125 MG tablet Take 6.25 mg by mouth every 12 (twelve) hours.    . ciprofloxacin (CIPRO) 500 MG tablet Take 1 tablet (500 mg total) by mouth 2 (two) times daily. 20 tablet 0  . diphenhydrAMINE (BENADRYL) 25 MG tablet Take 25 mg by mouth every 6 (six) hours as needed.    . furosemide (LASIX) 40 MG  tablet Take 40 mg by mouth daily as needed.    Marland Kitchen glipiZIDE (GLUCOTROL) 5 MG tablet 2.5 mg 2 (two) times daily before a meal.     . lisinopril (ZESTRIL) 5 MG tablet Take 5 mg by mouth daily.    . metFORMIN (GLUCOPHAGE) 500 MG tablet TAKE 1 TABLET BY MOUTH EVERY TWELVE HOURS    . nitroGLYCERIN (NITROSTAT) 0.4 MG SL tablet PLACE 1 TABLET (0.4 MG TOTAL) UNDER THE TONGUE EVERY FIVE (5) MINUTES AS NEEDED FOR CHEST PAIN.    Marland Kitchen topiramate (TOPAMAX) 50 MG tablet Take 1 tablet (50 mg total) by mouth daily. 30 tablet 3  . triamcinolone cream (KENALOG) 0.1 % Apply 1 application topically 2 (two) times daily.     No current facility-administered medications on file prior to visit.   Allergies  Allergen Reactions  . Penicillins Nausea And Vomiting  . Milk Protein Itching and Hives  . Milk-Related Compounds Hives and Itching    No results found for this or any previous visit (from the past 2160 hour(s)).  Objective: There were no vitals filed for this visit.  General: Patient is awake, alert, oriented x 3 and in no acute distress.  Dermatology: Skin is warm and dry bilateral with significant dryness to lower extremities  Partial thickness Sub met 5 on left ulceration measures 0.3 x 0.1 cm with granular base, there is no malodor, no erythema, no swelling warmth to the area noted.  Minimal preulcerative callus plantar fifth metatarsal head on right with no acute signs of infection.  Dry skin to right heel with no signs of infection.  No other acute signs of infection.   Vascular: Pedal pulses are difficult to palpate due 1+ swelling.  Neurologic: Protective sensation absent bilateral.  Musculosketal: There is mininmal pain with palpation to ulcerated at sub met 5 on right greater than left. No pain with compression to calves bilateral.  Patient is status post right fourth toe amputation which remains healed.   No results for input(s): GRAMSTAIN, LABORGA in the last 8760 hours.  Assessment  and Plan:  Problem List Items Addressed This Visit      Endocrine   Diabetic peripheral neuropathy ([place])    Other Visit Diagnoses    Foot ulcer, left, limited to breakdown of skin ([place])    -  Primary   Left foot pain         -Examined patient and discussed the progression of the wounds and treatment alternatives. -Cleansed ulceration at sub met 5 on left - Excisionally dedbrided ulceration at sub met 5 on left to healthy bleeding borders removing nonviable tissue using a sterile chisel blade and tissue nipper to the level of the joint capsule. Wound measures post debridement as above. Wound was debrided to the level of the dermis with wound base exposed to promote healing. Hemostasis was achieved with manuel pressure. Patient tolerated procedure well without any major discomfort or anesthesia necessary for this wound debridement.  -Applied small amount of Iodosorb and Band-Aid dressing and home nursing to use silver nitrate and or Iodosorb to the area like before and apply foot miracle cream to dry skin areas especially on the heels like before to help with dry skin like before; expect to need home nursing for approximately 1 more week.  If area remains healed may discontinue home nursing -Recommend continue with offloading padding on the right shoe - Advised patient to go to the ER or return to office if the wound worsens or if constitutional symptoms are present. -Patient to return to office in 3-4 weeks for follow-up wound care and evaluation or sooner if problems arise.  Will Trim callus on right foot if needed at next visit.  [name], DPM

## 2019-11-02 ENCOUNTER — Telehealth: Payer: Self-pay

## 2019-11-02 NOTE — Telephone Encounter (Signed)
Ok great.

## 2019-11-02 NOTE — Telephone Encounter (Signed)
Craig Rollins from [facility] stating they discharged pt yesterday since his wound is healed and goals have been meet

## 2019-11-04 ENCOUNTER — Other Ambulatory Visit: Payer: Self-pay

## 2019-11-04 ENCOUNTER — Ambulatory Visit (INDEPENDENT_AMBULATORY_CARE_PROVIDER_SITE_OTHER): Payer: Medicare Other | Admitting: Sports Medicine

## 2019-11-04 DIAGNOSIS — M79672 Pain in left foot: Secondary | ICD-10-CM | POA: Diagnosis not present

## 2019-11-04 DIAGNOSIS — L97521 Non-pressure chronic ulcer of other part of left foot limited to breakdown of skin: Secondary | ICD-10-CM | POA: Diagnosis not present

## 2019-11-04 DIAGNOSIS — L84 Corns and callosities: Secondary | ICD-10-CM

## 2019-11-04 DIAGNOSIS — E1142 Type 2 diabetes mellitus with diabetic polyneuropathy: Secondary | ICD-10-CM | POA: Diagnosis not present

## 2019-11-04 NOTE — Progress Notes (Signed)
Subjective: Craig Rollins is a 64 y.o. male patient seen in office for follow up evaluation of ulceration of the left  foot.  Patient reports that he is doing good left foot has healed and scabbed over there is no pain redness swelling drainage denies nausea vomiting fever chills or any other constitutional symptoms this time.  FBS 172 and last A1c 7.1  Patient Active Problem List   Diagnosis Date Noted  . Chest discomfort 08/13/2018  . CHF (congestive heart failure) ([place]) 08/13/2018  . COPD (chronic obstructive pulmonary disease) ([place]) 08/13/2018  . Open wound 08/13/2018  . Shortness of breath 08/13/2018  . CAD in native artery 02/18/2018  . Aortic stenosis 02/18/2018  . Essential hypertension 02/18/2018  . Foot swelling 01/26/2018  . Diabetes mellitus, type 2 ([place]) 01/26/2018  . Diabetic peripheral neuropathy ([place]) 02/25/2017  . Hammer toes of both feet 02/25/2017  . Risk for falls 09/23/2016  . NSTEMI (non-ST elevated myocardial infarction) ([place]) 08/30/2016  . Wound eschar of foot 08/21/2016  . Dilated cardiomyopathy ([place]) 07/16/2016  . Dyslipidemia (high LDL; low HDL) 07/16/2016  . Noncompliance 07/16/2016  . Smoking 07/16/2016  . Stroke ([place]) 12/19/2015   Current Outpatient Medications on File Prior to Visit  Medication Sig Dispense Refill  . albuterol (VENTOLIN HFA) 108 (90 Base) MCG/ACT inhaler Inhale into the lungs.    Marland Kitchen apixaban (ELIQUIS) 5 MG TABS tablet Take by mouth 2 (two) times daily.     Marland Kitchen aspirin EC 81 MG tablet Take 81 mg by mouth daily.    Marland Kitchen atorvastatin (LIPITOR) 40 MG tablet daily.     . carvedilol (COREG) 3.125 MG tablet Take 6.25 mg by mouth every 12 (twelve) hours.    . ciprofloxacin (CIPRO) 500 MG tablet Take 1 tablet (500 mg total) by mouth 2 (two) times daily. 20 tablet 0  . diphenhydrAMINE (BENADRYL) 25 MG tablet Take 25 mg by mouth every 6 (six) hours as needed.    . finasteride (PROSCAR) 5 MG tablet Take 5 mg by mouth daily.    . furosemide  (LASIX) 40 MG tablet Take 40 mg by mouth daily as needed.    Marland Kitchen glipiZIDE (GLUCOTROL) 5 MG tablet 2.5 mg 2 (two) times daily before a meal.     . lisinopril (ZESTRIL) 5 MG tablet Take 5 mg by mouth daily.    . metFORMIN (GLUCOPHAGE) 500 MG tablet TAKE 1 TABLET BY MOUTH EVERY TWELVE HOURS    . nitroGLYCERIN (NITROSTAT) 0.4 MG SL tablet PLACE 1 TABLET (0.4 MG TOTAL) UNDER THE TONGUE EVERY FIVE (5) MINUTES AS NEEDED FOR CHEST PAIN.    Marland Kitchen topiramate (TOPAMAX) 50 MG tablet Take 1 tablet (50 mg total) by mouth daily. 30 tablet 3  . triamcinolone cream (KENALOG) 0.1 % Apply 1 application topically 2 (two) times daily.     No current facility-administered medications on file prior to visit.   Allergies  Allergen Reactions  . Penicillins Nausea And Vomiting  . Milk Protein Itching and Hives  . Milk-Related Compounds Hives and Itching    No results found for this or any previous visit (from the past 2160 hour(s)).  Objective: There were no vitals filed for this visit.  General: Patient is awake, alert, oriented x 3 and in no acute distress.  Dermatology: Skin is warm and dry bilateral with significant dryness to lower extremities  Healed ulceration left foot no erythema no edema no acute signs of infection.  Minimal preulcerative callus plantar fifth metatarsal head on  right with no acute signs of infection.  Dry skin to right heel with no signs of infection.  No other acute signs of infection.   Vascular: Pedal pulses are difficult to palpate due 1+ swelling.  Neurologic: Protective sensation absent bilateral.  Musculosketal: There no pain to palpation bilateral.  No pain with compression to calves bilateral.  Patient is status post right fourth toe amputation which remains healed.   No results for input(s): GRAMSTAIN, LABORGA in the last 8760 hours.  Assessment and Plan:  Problem List Items Addressed This Visit      Endocrine   Diabetic peripheral neuropathy ([place])    Other  Visit Diagnoses    Foot ulcer, left, limited to breakdown of skin ([place])    -  Primary   Healed   Left foot pain       Pre-ulcerative calluses         -Examined patient  -Previous ulcer is healed -Advised patient to continue with daily skin emollients applied and gave patient a sample of foot miracle cream -Patient to see [name] or Benjie Karvonen for measurement for diabetic shoes and insoles to offload preulcerative areas -Patient to return to office as scheduled for diabetic shoe and insole measurements. [name], DPM

## 2019-11-12 ENCOUNTER — Other Ambulatory Visit: Payer: Self-pay

## 2019-11-12 ENCOUNTER — Ambulatory Visit: Payer: Medicare Other | Admitting: Orthotics

## 2019-11-12 DIAGNOSIS — E1142 Type 2 diabetes mellitus with diabetic polyneuropathy: Secondary | ICD-10-CM

## 2019-11-12 DIAGNOSIS — M79672 Pain in left foot: Secondary | ICD-10-CM

## 2019-11-12 DIAGNOSIS — L97521 Non-pressure chronic ulcer of other part of left foot limited to breakdown of skin: Secondary | ICD-10-CM

## 2019-11-12 NOTE — Progress Notes (Addendum)

## 2019-11-23 ENCOUNTER — Encounter: Payer: Self-pay | Admitting: *Deleted

## 2019-11-23 DIAGNOSIS — E7800 Pure hypercholesterolemia, unspecified: Secondary | ICD-10-CM

## 2019-11-23 HISTORY — DX: Pure hypercholesterolemia, unspecified: E78.00

## 2019-12-03 DIAGNOSIS — R319 Hematuria, unspecified: Secondary | ICD-10-CM | POA: Diagnosis not present

## 2019-12-09 DIAGNOSIS — N401 Enlarged prostate with lower urinary tract symptoms: Secondary | ICD-10-CM | POA: Diagnosis not present

## 2019-12-09 DIAGNOSIS — R319 Hematuria, unspecified: Secondary | ICD-10-CM | POA: Diagnosis not present

## 2019-12-10 DIAGNOSIS — N400 Enlarged prostate without lower urinary tract symptoms: Secondary | ICD-10-CM | POA: Diagnosis not present

## 2019-12-10 DIAGNOSIS — I700 Atherosclerosis of aorta: Secondary | ICD-10-CM | POA: Diagnosis not present

## 2019-12-10 DIAGNOSIS — N281 Cyst of kidney, acquired: Secondary | ICD-10-CM | POA: Diagnosis not present

## 2019-12-10 DIAGNOSIS — R319 Hematuria, unspecified: Secondary | ICD-10-CM | POA: Diagnosis not present

## 2019-12-10 DIAGNOSIS — N3289 Other specified disorders of bladder: Secondary | ICD-10-CM | POA: Diagnosis not present

## 2019-12-13 NOTE — Progress Notes (Signed)
Cardiology Office Note:    Date:  12/14/2019   ID:  [name], DOB Feb 21, 1956, MRN [id number]  PCP:  [name], NP  Cardiologist:  Shirlee More, MD   Referring MD: [name], NP  ASSESSMENT:    1. CAD in native artery   2. Ischemic cardiomyopathy   3. Essential hypertension   4. Familial hyperlipidemia   5. Aortic valve stenosis, etiology of cardiac valve disease unspecified    PLAN:    In order of problems listed above:  1. Stable CAD I have asked him resume clopidogrel his dual antiplatelet therapy continue his high intensity statin. 2. No evidence of heart failure continue beta-blocker ACE inhibitor and will search records at [facility] he thinks that he has had an echocardiogram the last few months and his current dose of diuretic. 3. BP at target continue guideline directed treatment 4. Lipids improved LDL at target continue his high intensity statin 5. Will need a repeat echocardiogram around his next visit  Next appointment in 6 months   Medication Adjustments/Labs and Tests Ordered: Current medicines are reviewed at length with the patient today.  Concerns regarding medicines are outlined above.  No orders of the defined types were placed in this encounter.  No orders of the defined types were placed in this encounter.    Chief Complaint  Patient presents with  . Follow-up  . Coronary Artery Disease  . Congestive Heart Failure  . Aortic Stenosis    History of Present Illness:    Craig Rollins is a 64 y.o. male who is being seen today to reestablish cardiology care at the request of [name], NP. He was scheduled to be seen in December was a no-show.  My records show a history of CAD with PCI of the LAD and right coronary artery 09/26/2016 cardiomyopathy ejection fraction 20 to 25% other problems include mild to moderate aortic regurgitation hypertensive heart disease type 2 diabetes mellitus hyperlipidemia and stroke 2019.   Echocardiogram performed 02/17/2018 showed improvement in EF 50 to 55% with anterolateral hypokinesia and moderate aortic stenosis with peak and mean gradients of 34 and 20 mmHg valve area 1.2 cm.  Chart review shows that he had PCI and stent right coronary artery [facility] 03/29/2019.  Ejection fraction diminished 40% mild aortic stenosis noted.  He had peripheral arterial disease and advised to undergo angiography for nonhealing wound left lower extremity distal tip of the second or third toe.  Records primary care physician shows that soon afterwards he sustained a pulmonary embolism and was transiently on triple therapy with Eliquis Plavix and aspirin at the time of his last visit aspirin and Eliquis.  Labs from that visit 11/19/2019 shows a creatinine of 1.10 GFR 72 cc potassium 5.3 A1c 6.9%.  Cholesterol at target 143 LDL 87 triglycerides 95 HDL 38 and CBC was normal.  Left heart cath 03/29/2019: FINDINGS:  Severe single vessel CAD  Severe distal RCA stenosis  Patent mid LAd stent  Patent mid RCA stent  Aortic stenosis with peak to peak gradient of 25 mmHg. Mean gradient 19 mmHg  Diagnostic Recommendations  Proceed with PCI to RCA  Interventional Summary  Successful PCI with DES, direct stenting, to distal RAC  Interventional Recommendations   Echocardiogram 03/29/2019 [facility]: Conclusions  Summary  Technically difficult and limited study. LVEF has improved vs. previous echo  report  Mild concentric left ventricular hypertrophy with moderate global hypokinesis  and ejection fraction  visually estimated at 10-17%  Diastolic function is indeterminate  The aortic valve leaflets were not well visualized. Transvalvular gradients  are moderately elevated, mean 18 mmHg peak 36 mmHg probably due to a combination of mild AS and moderate AI  He thinks he was in [facility] last few months may have had an echocardiogram and will research records. Finally the  wound on his foot is healed.  He is feeling better and is more active and is not having edema shortness of breath chest pain palpitation or syncope and compliant with medications.  He is no longer on anticoagulant takes aspirin as a single antiplatelet agent.  Labs done in his PCP office recently 11/18/2019 shows lipids at target cholesterol 143 LDL 87 on atorvastatin HDL 38 triglycerides 95 A1c at target 6.9% creatinine normal 1.10.  He tolerates his statin without muscle pain or weakness. Past Medical History:  Diagnosis Date  . Aortic stenosis 02/18/2018  . Brain aneurysm   . CAD in native artery 02/18/2018  . Cataract   . Chest discomfort 08/13/2018  . CHF (congestive heart failure) ([place])   . COPD (chronic obstructive pulmonary disease) ([place])   . Diabetes ([place])   . Diabetes mellitus, type 2 ([place]) 01/26/2018  . Diabetic peripheral neuropathy ([place]) 02/25/2017  . Dilated cardiomyopathy ([place]) 07/16/2016   Ejection fraction 30% based on echo from 2018  . Dyslipidemia (high LDL; low HDL) 07/16/2016  . Essential hypertension 02/18/2018  . Foot swelling 01/26/2018  . Hammer toes of both feet 02/25/2017  . High cholesterol   . Hypercholesteremia 11/23/2019  . Myocardial infarction ([place])   . Noncompliance 07/16/2016  . NSTEMI (non-ST elevated myocardial infarction) ([place]) 08/30/2016  . Shortness of breath 08/13/2018  . Smoking 07/16/2016  . Stroke ([place])   . TIA (transient ischemic attack)   . Unstable angina ([place]) 03/30/2019  . Wound eschar of foot 08/21/2016    Past Surgical History:  Procedure Laterality Date  . CORONARY ANGIOPLASTY WITH STENT PLACEMENT    . KNEE SURGERY      Current Medications: Current Meds  Medication Sig  . albuterol (VENTOLIN HFA) 108 (90 Base) MCG/ACT inhaler Inhale into the lungs.  Marland Kitchen aspirin EC 81 MG tablet Take 81 mg by mouth daily.  Marland Kitchen atorvastatin (LIPITOR) 40 MG tablet daily.   . carvedilol (COREG) 3.125 MG tablet Take 6.25 mg by mouth every 12 (twelve) hours.  .  diphenhydrAMINE (BENADRYL) 25 MG tablet Take 25 mg by mouth every 6 (six) hours as needed.  . finasteride (PROSCAR) 5 MG tablet Take 5 mg by mouth daily.  . furosemide (LASIX) 40 MG tablet Take 40 mg by mouth daily as needed.  Marland Kitchen glipiZIDE (GLUCOTROL) 5 MG tablet 2.5 mg 2 (two) times daily before a meal.   . lisinopril (ZESTRIL) 5 MG tablet Take 5 mg by mouth daily.  . metFORMIN (GLUCOPHAGE) 500 MG tablet TAKE 1 TABLET BY MOUTH EVERY TWELVE HOURS  . nitroGLYCERIN (NITROSTAT) 0.4 MG SL tablet PLACE 1 TABLET (0.4 MG TOTAL) UNDER THE TONGUE EVERY FIVE (5) MINUTES AS NEEDED FOR CHEST PAIN.  . tamsulosin (FLOMAX) 0.4 MG CAPS capsule Take 1 capsule by mouth daily.  Marland Kitchen topiramate (TOPAMAX) 50 MG tablet Take 1 tablet (50 mg total) by mouth daily.  Marland Kitchen triamcinolone cream (KENALOG) 0.1 % Apply 1 application topically 2 (two) times daily.     Allergies:   Penicillins, Milk protein, and Milk-related compounds   Social History   Socioeconomic History  . Marital status: Single  Spouse name: Not on file  . Number of children: Not on file  . Years of education: Not on file  . Highest education level: Not on file  Occupational History  . Not on file  Tobacco Use  . Smoking status: Current Every Day Smoker    Packs/day: 1.00    Years: 55.00    Pack years: 55.00  . Smokeless tobacco: Never Used  Vaping Use  . Vaping Use: Former  Substance and Sexual Activity  . Alcohol use: Not Currently  . Drug use: Never  . Sexual activity: Not on file  Other Topics Concern  . Not on file  Social History Narrative  . Not on file   Social Determinants of Health   Financial Resource Strain:   . Difficulty of Paying Living Expenses:   Food Insecurity:   . Worried About Charity fundraiser in the Last Year:   . Arboriculturist in the Last Year:   Transportation Needs: No Transportation Needs  . Lack of Transportation (Medical): No  . Lack of Transportation (Non-Medical): No  Physical Activity:   . Days  of Exercise per Week:   . Minutes of Exercise per Session:   Stress:   . Feeling of Stress :   Social Connections:   . Frequency of Communication with Friends and Family:   . Frequency of Social Gatherings with Friends and Family:   . Attends Religious Services:   . Active Member of Clubs or Organizations:   . Attends Archivist Meetings:   Marland Kitchen Marital Status:      Family History: The patient's family history includes Breast cancer in his paternal grandmother; Diabetes in his father; Heart attack in his father and paternal grandfather; Heart disease in his father; Hyperlipidemia in his father; Hypertension in his father; Liver cancer in his maternal grandfather.  ROS:   Review of Systems  Constitutional: Negative.  HENT: Negative.   Eyes: Negative.   Cardiovascular: Negative.   Respiratory: Negative.   Endocrine: Negative.   Hematologic/Lymphatic: Negative.   Skin: Negative.   Musculoskeletal: Negative.   Gastrointestinal: Negative.   Genitourinary: Negative.   Neurological: Negative.   Psychiatric/Behavioral: Negative.   Allergic/Immunologic: Negative.    Please see the history of present illness.     All other systems reviewed and are negative.  EKGs/Labs/Other Studies Reviewed:    The following studies were reviewed today:   EKG:  EKG is  ordered today.  The ekg ordered today is personally reviewed and demonstrates sinus rhythm nonspecific ST-T otherwise normal  Recent Labs: No results found for requested labs within last 8760 hours.  Recent Lipid Panel    Component Value Date/Time   CHOL 107 04/01/2018 1619   TRIG 116 04/01/2018 1619   HDL 32 (L) 04/01/2018 1619   CHOLHDL 3.3 04/01/2018 1619   LDLCALC 52 04/01/2018 1619    Physical Exam:    VS:  BP (!) 152/78 (BP Location: Right Arm, Patient Position: Sitting, Cuff Size: Normal)   Pulse 105   Ht 6\' 4"  (1.93 m)   Wt (!) 275 lb 3.2 oz (124.8 kg)   SpO2 98%   BMI 33.50 kg/m     Wt Readings from  Last 3 Encounters:  12/14/19 (!) 275 lb 3.2 oz (124.8 kg)  04/01/18 273 lb 9.6 oz (124.1 kg)  03/24/18 275 lb 3.2 oz (124.8 kg)     GEN: He does not look chronically ill well nourished, well developed in no acute  distress HEENT: Normal NECK: No JVD; No carotid bruits LYMPHATICS: No lymphadenopathy CARDIAC: RRR, no murmurs, rubs, gallops RESPIRATORY:  Clear to auscultation without rales, wheezing or rhonchi  ABDOMEN: Soft, non-tender, non-distended MUSCULOSKELETAL:  No edema; No deformity  SKIN: Warm and dry NEUROLOGIC:  Alert and oriented x 3 PSYCHIATRIC:  Normal affect     Signed, Shirlee More, MD  12/14/2019 1:38 PM    [facility]

## 2019-12-14 ENCOUNTER — Encounter: Payer: Self-pay | Admitting: Cardiology

## 2019-12-14 ENCOUNTER — Ambulatory Visit (INDEPENDENT_AMBULATORY_CARE_PROVIDER_SITE_OTHER): Payer: Medicare Other | Admitting: Cardiology

## 2019-12-14 ENCOUNTER — Other Ambulatory Visit: Payer: Self-pay

## 2019-12-14 VITALS — BP 152/78 | HR 105 | Ht 76.0 in | Wt 275.2 lb

## 2019-12-14 DIAGNOSIS — I2510 Atherosclerotic heart disease of native coronary artery without angina pectoris: Secondary | ICD-10-CM | POA: Diagnosis not present

## 2019-12-14 DIAGNOSIS — E7849 Other hyperlipidemia: Secondary | ICD-10-CM

## 2019-12-14 DIAGNOSIS — I350 Nonrheumatic aortic (valve) stenosis: Secondary | ICD-10-CM

## 2019-12-14 DIAGNOSIS — I255 Ischemic cardiomyopathy: Secondary | ICD-10-CM | POA: Diagnosis not present

## 2019-12-14 DIAGNOSIS — I10 Essential (primary) hypertension: Secondary | ICD-10-CM | POA: Diagnosis not present

## 2019-12-14 MED ORDER — CLOPIDOGREL BISULFATE 75 MG PO TABS
75.0000 mg | ORAL_TABLET | Freq: Every day | ORAL | 3 refills | Status: DC
Start: 2019-12-14 — End: 2020-12-08

## 2019-12-14 NOTE — Patient Instructions (Signed)
Medication Instructions:  Your physician has recommended you make the following change in your medication:  START: Plavix 75 mg take one tablet by mouth daily.  *If you need a refill on your cardiac medications before your next appointment, please call your pharmacy*   Lab Work: None If you have labs (blood work) drawn today and your tests are completely normal, you will receive your results only by: . MyChart Message (if you have MyChart) OR . A paper copy in the mail If you have any lab test that is abnormal or we need to change your treatment, we will call you to review the results.   Testing/Procedures: None   Follow-Up: At [facility], you and your health needs are our priority.  As part of our continuing mission to provide you with exceptional heart care, we have created designated Provider Care Teams.  These Care Teams include your primary Cardiologist (physician) and Advanced Practice Providers (APPs -  Physician Assistants and Nurse Practitioners) who all work together to provide you with the care you need, when you need it.  We recommend signing up for the patient portal called "MyChart".  Sign up information is provided on this After Visit Summary.  MyChart is used to connect with patients for Virtual Visits (Telemedicine).  Patients are able to view lab/test results, encounter notes, upcoming appointments, etc.  Non-urgent messages can be sent to your provider as well.   To learn more about what you can do with MyChart, go to [email or web address].    Your next appointment:   6 month(s)  The format for your next appointment:   In Person  Provider:   Brian Munley, MD   Other Instructions   

## 2019-12-23 ENCOUNTER — Telehealth: Payer: Self-pay | Admitting: Cardiology

## 2019-12-23 NOTE — Telephone Encounter (Signed)
Spoke with Tye Maryland just now and she wanted to know if the patient was suppose to be taking eliquis, plavix, and aspirin all at the same time. My records show that the patient is on plavix and aspirin but Tye Maryland states that the patient is taking all three.   I will route to Dr. Bettina Gavia for further advise

## 2019-12-23 NOTE — Telephone Encounter (Signed)
Craig Rollins is aware of Dr. Joya Gaskins recommendation and will update his chart.

## 2019-12-23 NOTE — Telephone Encounter (Signed)
Attempted to return call, but the office is closed. Will attempt later today.

## 2019-12-23 NOTE — Telephone Encounter (Signed)
New Message:    Please cal asap, thinks pt might be on too many Anti Platelets.

## 2019-12-23 NOTE — Telephone Encounter (Signed)
I did not prescribe Eliquis and is not on his medication list and per my perspective without anticoagulation he should be taking aspirin and clopidogrel.

## 2019-12-31 ENCOUNTER — Other Ambulatory Visit: Payer: Self-pay

## 2019-12-31 ENCOUNTER — Ambulatory Visit (INDEPENDENT_AMBULATORY_CARE_PROVIDER_SITE_OTHER): Payer: Medicare Other | Admitting: Sports Medicine

## 2019-12-31 ENCOUNTER — Encounter: Payer: Self-pay | Admitting: Sports Medicine

## 2019-12-31 DIAGNOSIS — B351 Tinea unguium: Secondary | ICD-10-CM | POA: Diagnosis not present

## 2019-12-31 DIAGNOSIS — M79675 Pain in left toe(s): Secondary | ICD-10-CM | POA: Diagnosis not present

## 2019-12-31 DIAGNOSIS — L853 Xerosis cutis: Secondary | ICD-10-CM

## 2019-12-31 DIAGNOSIS — M79674 Pain in right toe(s): Secondary | ICD-10-CM

## 2019-12-31 DIAGNOSIS — E1142 Type 2 diabetes mellitus with diabetic polyneuropathy: Secondary | ICD-10-CM | POA: Diagnosis not present

## 2019-12-31 DIAGNOSIS — Z899 Acquired absence of limb, unspecified: Secondary | ICD-10-CM

## 2019-12-31 NOTE — Progress Notes (Signed)
Subjective: Craig Rollins is a 64 y.o. male patient with history of diabetes who presents to office today complaining of long,mildly painful nails  while ambulating in shoes; unable to trim. Patient states that the glucose reading this morning was 106 mg/dl.  Last A1c 6.8.  Last visit to Dr. Fraser Din 3 weeks ago patient denies any new changes in medication or new problems. Patient is doing better with the foot miracle cream that he purchased from our office.  Reports that he is no longer on Eliquis and got a good report of his cardiology last visit.  Patient Active Problem List   Diagnosis Date Noted  . Hypercholesteremia 11/23/2019  . Unstable angina ([place]) 03/30/2019  . Chest discomfort 08/13/2018  . CHF (congestive heart failure) ([place]) 08/13/2018  . COPD (chronic obstructive pulmonary disease) ([place]) 08/13/2018  . Open wound 08/13/2018  . Shortness of breath 08/13/2018  . CAD in native artery 02/18/2018  . Aortic stenosis 02/18/2018  . Essential hypertension 02/18/2018  . Foot swelling 01/26/2018  . Diabetes mellitus, type 2 ([place]) 01/26/2018  . Diabetic peripheral neuropathy ([place]) 02/25/2017  . Hammer toes of both feet 02/25/2017  . Risk for falls 09/23/2016  . NSTEMI (non-ST elevated myocardial infarction) ([place]) 08/30/2016  . Wound eschar of foot 08/21/2016  . Dilated cardiomyopathy ([place]) 07/16/2016  . Dyslipidemia (high LDL; low HDL) 07/16/2016  . Noncompliance 07/16/2016  . Smoking 07/16/2016  . Stroke ([place]) 12/19/2015   Current Outpatient Medications on File Prior to Visit  Medication Sig Dispense Refill  . albuterol (VENTOLIN HFA) 108 (90 Base) MCG/ACT inhaler Inhale into the lungs.    Marland Kitchen aspirin EC 81 MG tablet Take 81 mg by mouth daily.    Marland Kitchen atorvastatin (LIPITOR) 40 MG tablet daily.     . carvedilol (COREG) 3.125 MG tablet Take 6.25 mg by mouth every 12 (twelve) hours.    . clopidogrel (PLAVIX) 75 MG tablet Take 1 tablet (75 mg total) by mouth daily. 90 tablet 3  .  diphenhydrAMINE (BENADRYL) 25 MG tablet Take 25 mg by mouth every 6 (six) hours as needed.    . finasteride (PROSCAR) 5 MG tablet Take 5 mg by mouth daily.    . furosemide (LASIX) 40 MG tablet Take 40 mg by mouth daily as needed.    Marland Kitchen glipiZIDE (GLUCOTROL) 5 MG tablet 2.5 mg 2 (two) times daily before a meal.     . lisinopril (ZESTRIL) 5 MG tablet Take 5 mg by mouth daily.    . metFORMIN (GLUCOPHAGE) 500 MG tablet TAKE 1 TABLET BY MOUTH EVERY TWELVE HOURS    . nitroGLYCERIN (NITROSTAT) 0.4 MG SL tablet PLACE 1 TABLET (0.4 MG TOTAL) UNDER THE TONGUE EVERY FIVE (5) MINUTES AS NEEDED FOR CHEST PAIN.    . tamsulosin (FLOMAX) 0.4 MG CAPS capsule Take 1 capsule by mouth daily.    Marland Kitchen topiramate (TOPAMAX) 50 MG tablet Take 1 tablet (50 mg total) by mouth daily. 30 tablet 3  . triamcinolone cream (KENALOG) 0.1 % Apply 1 application topically 2 (two) times daily.     No current facility-administered medications on file prior to visit.   Allergies  Allergen Reactions  . Penicillins Nausea And Vomiting  . Milk Protein Itching and Hives  . Milk-Related Compounds Hives and Itching    No results found for this or any previous visit (from the past 2160 hour(s)).  Objective: General: Patient is awake, alert, and oriented x 3 and in no acute distress.  Integument: Skin is warm,  dry and supple bilateral. Nails are tender, long, thickened and dystrophic with subungual debris, consistent with onychomycosis, 1 through 5 on the left and 1-3 and 5 on right, no signs of infection.  Minimal reactive keratosis plantar right foot.  Previous left foot ulcer remains healed.  Remaining integument unremarkable.  Vasculature:  Dorsalis Pedis pulse 1/4 bilateral. Posterior Tibial pulse  0/4 bilateral. Capillary fill time <5 sec in all remaining digits.  Trophic skin just.  No varicosities present bilateral. No edema present bilateral.   Neurology: Protective sensation absent bilateral with Semmes Weinstein  monofilament  Musculoskeletal: No pain to palpation.  Patient is status post right fourth toe amputation remains healed.  Assessment and Plan: Problem List Items Addressed This Visit      Endocrine   Diabetic peripheral neuropathy ([place])    Other Visit Diagnoses    Pain due to onychomycosis of toenails of both feet    -  Primary   Xerosis cutis       History of amputation          -Examined patient. -Discussed and educated patient on diabetic foot care, especially with  regards to the vascular, neurological and musculoskeletal systems.  -Stressed the importance of good glycemic control and the detriment of not  controlling glucose levels in relation to the foot. -Mechanically debrided all nails x9  bilateral using sterile nail nipper and filed with dremel without incident  -Continue with foot miracle cream for dry skin -Continue with the supportive shoes daily and offloading padding on right fifth -Answered all patient questions -Patient to return  in 2 months for at risk foot care -Patient advised to call the office if any problems or questions arise in the meantime.  [name], DPM

## 2020-01-27 DIAGNOSIS — Z1331 Encounter for screening for depression: Secondary | ICD-10-CM | POA: Diagnosis not present

## 2020-01-27 DIAGNOSIS — E785 Hyperlipidemia, unspecified: Secondary | ICD-10-CM | POA: Diagnosis not present

## 2020-01-27 DIAGNOSIS — Z6833 Body mass index (BMI) 33.0-33.9, adult: Secondary | ICD-10-CM | POA: Diagnosis not present

## 2020-01-27 DIAGNOSIS — Z9181 History of falling: Secondary | ICD-10-CM | POA: Diagnosis not present

## 2020-01-27 DIAGNOSIS — Z0000 Encounter for general adult medical examination without abnormal findings: Secondary | ICD-10-CM | POA: Diagnosis not present

## 2020-03-03 ENCOUNTER — Other Ambulatory Visit: Payer: Self-pay

## 2020-03-03 ENCOUNTER — Ambulatory Visit (INDEPENDENT_AMBULATORY_CARE_PROVIDER_SITE_OTHER): Payer: Medicare HMO | Admitting: Sports Medicine

## 2020-03-03 ENCOUNTER — Encounter: Payer: Self-pay | Admitting: Sports Medicine

## 2020-03-03 DIAGNOSIS — B351 Tinea unguium: Secondary | ICD-10-CM | POA: Diagnosis not present

## 2020-03-03 DIAGNOSIS — E1142 Type 2 diabetes mellitus with diabetic polyneuropathy: Secondary | ICD-10-CM

## 2020-03-03 DIAGNOSIS — M79675 Pain in left toe(s): Secondary | ICD-10-CM

## 2020-03-03 DIAGNOSIS — L853 Xerosis cutis: Secondary | ICD-10-CM

## 2020-03-03 DIAGNOSIS — M79674 Pain in right toe(s): Secondary | ICD-10-CM

## 2020-03-03 DIAGNOSIS — Z899 Acquired absence of limb, unspecified: Secondary | ICD-10-CM | POA: Diagnosis not present

## 2020-03-03 NOTE — Progress Notes (Signed)
Subjective: Harrold Jamale Spangler is a 64 y.o. male patient with history of diabetes who presents to office today complaining of long,mildly painful nails  while ambulating in shoes; unable to trim. Patient states that the glucose reading is doing good. Any changes with medical history or medications since last encounter. No other pedal complaints noted.  Patient Active Problem List   Diagnosis Date Noted  . Hypercholesteremia 11/23/2019  . Unstable angina ([place]) 03/30/2019  . Chest discomfort 08/13/2018  . CHF (congestive heart failure) ([place]) 08/13/2018  . COPD (chronic obstructive pulmonary disease) ([place]) 08/13/2018  . Open wound 08/13/2018  . Shortness of breath 08/13/2018  . CAD in native artery 02/18/2018  . Aortic stenosis 02/18/2018  . Essential hypertension 02/18/2018  . Foot swelling 01/26/2018  . Diabetes mellitus, type 2 ([place]) 01/26/2018  . Diabetic peripheral neuropathy ([place]) 02/25/2017  . Hammer toes of both feet 02/25/2017  . Risk for falls 09/23/2016  . NSTEMI (non-ST elevated myocardial infarction) ([place]) 08/30/2016  . Wound eschar of foot 08/21/2016  . Dilated cardiomyopathy ([place]) 07/16/2016  . Dyslipidemia (high LDL; low HDL) 07/16/2016  . Noncompliance 07/16/2016  . Smoking 07/16/2016  . Stroke ([place]) 12/19/2015   Current Outpatient Medications on File Prior to Visit  Medication Sig Dispense Refill  . albuterol (VENTOLIN HFA) 108 (90 Base) MCG/ACT inhaler Inhale into the lungs.    Marland Kitchen aspirin EC 81 MG tablet Take 81 mg by mouth daily.    Marland Kitchen atorvastatin (LIPITOR) 40 MG tablet daily.     . carvedilol (COREG) 3.125 MG tablet Take 6.25 mg by mouth every 12 (twelve) hours.    . clopidogrel (PLAVIX) 75 MG tablet Take 1 tablet (75 mg total) by mouth daily. 90 tablet 3  . diphenhydrAMINE (BENADRYL) 25 MG tablet Take 25 mg by mouth every 6 (six) hours as needed.    . finasteride (PROSCAR) 5 MG tablet Take 5 mg by mouth daily.    . furosemide (LASIX) 40 MG tablet Take 40 mg by  mouth daily as needed.    Marland Kitchen glipiZIDE (GLUCOTROL) 5 MG tablet 2.5 mg 2 (two) times daily before a meal.     . lisinopril (ZESTRIL) 5 MG tablet Take 5 mg by mouth daily.    . metFORMIN (GLUCOPHAGE) 500 MG tablet TAKE 1 TABLET BY MOUTH EVERY TWELVE HOURS    . nitroGLYCERIN (NITROSTAT) 0.4 MG SL tablet PLACE 1 TABLET (0.4 MG TOTAL) UNDER THE TONGUE EVERY FIVE (5) MINUTES AS NEEDED FOR CHEST PAIN.    . tamsulosin (FLOMAX) 0.4 MG CAPS capsule Take 1 capsule by mouth daily.    Marland Kitchen topiramate (TOPAMAX) 50 MG tablet Take 1 tablet (50 mg total) by mouth daily. 30 tablet 3  . triamcinolone cream (KENALOG) 0.1 % Apply 1 application topically 2 (two) times daily.     No current facility-administered medications on file prior to visit.   Allergies  Allergen Reactions  . Penicillins Nausea And Vomiting  . Milk Protein Itching and Hives  . Milk-Related Compounds Hives and Itching    No results found for this or any previous visit (from the past 2160 hour(s)).  Objective: General: Patient is awake, alert, and oriented x 3 and in no acute distress.  Integument: Skin is warm, dry and supple bilateral. Nails are tender, long, thickened and dystrophic with subungual debris, consistent with onychomycosis, 1 through 5 on the left and 1-3 and 5 on right, no signs of infection.  Minimal reactive keratosis plantar right foot.  Previous left foot  ulcer remains healed.  Remaining integument unremarkable.  Vasculature:  Dorsalis Pedis pulse 1/4 bilateral. Posterior Tibial pulse  0/4 bilateral. Capillary fill time <5 sec in all remaining digits.  Trophic skin just.  No varicosities present bilateral. No edema present bilateral.   Neurology: Protective sensation absent bilateral with Semmes Weinstein monofilament  Musculoskeletal: No pain to palpation.  Patient is status post right fourth toe amputation remains healed.  Assessment and Plan: Problem List Items Addressed This Visit      Endocrine   Diabetic  peripheral neuropathy ([place])    Other Visit Diagnoses    Pain due to onychomycosis of toenails of both feet    -  Primary   Xerosis cutis       History of amputation          -Examined patient. -Discussed and educated patient on diabetic foot care, -Mechanically debrided all nails x9  bilateral using sterile nail nipper and filed with dremel without incident  -Continue with foot miracle cream for dry skin like before -Continue with the supportive shoes daily and offloading padding on right fifth -Answered all patient questions -Patient to return  in 3 months for at risk foot care -Patient advised to call the office if any problems or questions arise in the meantime.  [name], DPM

## 2020-03-20 ENCOUNTER — Telehealth: Payer: Self-pay

## 2020-03-20 ENCOUNTER — Other Ambulatory Visit: Payer: Self-pay

## 2020-03-20 ENCOUNTER — Other Ambulatory Visit: Payer: Self-pay | Admitting: Cardiology

## 2020-03-20 ENCOUNTER — Other Ambulatory Visit (INDEPENDENT_AMBULATORY_CARE_PROVIDER_SITE_OTHER): Payer: Medicare HMO

## 2020-03-20 DIAGNOSIS — R079 Chest pain, unspecified: Secondary | ICD-10-CM | POA: Diagnosis not present

## 2020-03-20 LAB — ECHOCARDIOGRAM COMPLETE
AR max vel: 0.93 cm2
AV Area VTI: 0.85 cm2
AV Area mean vel: 0.91 cm2
AV Mean grad: 22 mmHg
AV Peak grad: 37.5 mmHg
Ao pk vel: 3.06 m/s
Area-P 1/2: 3.3 cm2
Calc EF: 46.5 %
P 1/2 time: 478 msec
S' Lateral: 4.3 cm
Single Plane A2C EF: 42.4 %
Single Plane A4C EF: 49.9 %

## 2020-03-20 NOTE — Telephone Encounter (Signed)
Tried calling patient. No answer and no voicemail set up for me to leave a message. 

## 2020-03-20 NOTE — Telephone Encounter (Signed)
-----   Message from Richardo Priest, MD sent at 03/20/2020  1:31 PM EDT ----- I am concerned that his aortic valve blockage or stenosis is becoming severe  I would like him to have in [place] a aortic valve calcification score same technique is a coronary artery calcium score.

## 2020-03-20 NOTE — Progress Notes (Signed)
Complete echocardiogram has been performed.  Jimmy Rhoderick Farrel RDCS, RVT 

## 2020-03-21 ENCOUNTER — Telehealth: Payer: Self-pay

## 2020-03-21 DIAGNOSIS — I350 Nonrheumatic aortic (valve) stenosis: Secondary | ICD-10-CM

## 2020-03-21 NOTE — Telephone Encounter (Signed)
Spoke with patient regarding results and recommendation.  Patient verbalizes understanding and is agreeable to plan of care. Advised patient to call back with any issues or concerns.  

## 2020-03-21 NOTE — Telephone Encounter (Signed)
-----   Message from Richardo Priest, MD sent at 03/20/2020  1:31 PM EDT ----- I am concerned that his aortic valve blockage or stenosis is becoming severe  I would like him to have in [place] a aortic valve calcification score same technique is a coronary artery calcium score.

## 2020-03-29 ENCOUNTER — Other Ambulatory Visit: Payer: Self-pay

## 2020-03-29 ENCOUNTER — Ambulatory Visit (INDEPENDENT_AMBULATORY_CARE_PROVIDER_SITE_OTHER): Payer: Medicare HMO | Admitting: Orthotics

## 2020-03-29 DIAGNOSIS — E1142 Type 2 diabetes mellitus with diabetic polyneuropathy: Secondary | ICD-10-CM | POA: Diagnosis not present

## 2020-03-29 DIAGNOSIS — B351 Tinea unguium: Secondary | ICD-10-CM | POA: Diagnosis not present

## 2020-03-29 DIAGNOSIS — L97521 Non-pressure chronic ulcer of other part of left foot limited to breakdown of skin: Secondary | ICD-10-CM

## 2020-03-29 DIAGNOSIS — Z899 Acquired absence of limb, unspecified: Secondary | ICD-10-CM

## 2020-03-29 DIAGNOSIS — M79675 Pain in left toe(s): Secondary | ICD-10-CM | POA: Diagnosis not present

## 2020-03-29 DIAGNOSIS — M79674 Pain in right toe(s): Secondary | ICD-10-CM | POA: Diagnosis not present

## 2020-03-29 NOTE — Progress Notes (Signed)
Patient picked up DBS and 3 pair of Custom DB inserts; they fit great with no heel slippage and had 1/4"  Distal clearanance.

## 2020-04-24 DIAGNOSIS — R079 Chest pain, unspecified: Secondary | ICD-10-CM | POA: Diagnosis not present

## 2020-04-25 ENCOUNTER — Telehealth: Payer: Self-pay | Admitting: Cardiology

## 2020-04-25 DIAGNOSIS — I671 Cerebral aneurysm, nonruptured: Secondary | ICD-10-CM | POA: Insufficient documentation

## 2020-04-25 DIAGNOSIS — G459 Transient cerebral ischemic attack, unspecified: Secondary | ICD-10-CM | POA: Insufficient documentation

## 2020-04-25 DIAGNOSIS — E7800 Pure hypercholesterolemia, unspecified: Secondary | ICD-10-CM | POA: Insufficient documentation

## 2020-04-25 DIAGNOSIS — E119 Type 2 diabetes mellitus without complications: Secondary | ICD-10-CM | POA: Insufficient documentation

## 2020-04-25 DIAGNOSIS — H269 Unspecified cataract: Secondary | ICD-10-CM | POA: Insufficient documentation

## 2020-04-25 DIAGNOSIS — I219 Acute myocardial infarction, unspecified: Secondary | ICD-10-CM | POA: Insufficient documentation

## 2020-04-25 NOTE — Telephone Encounter (Signed)
Patient c/o Palpitations:  High priority if patient c/o lightheadedness, shortness of breath, or chest pain  1) How long have you had palpitations/irregular HR/ Afib? Are you having the symptoms now? yes  2) Are you currently experiencing lightheadedness, SOB or CP? SOB  3) Do you have a history of afib (atrial fibrillation) or irregular heart rhythm? yes  4) Have you checked your BP or HR? (document readings if available): no  5) Are you experiencing any other symptoms? SOB  Pt c/o Shortness Of Breath: STAT if SOB developed within the last 24 hours or pt is noticeably SOB on the phone  1. Are you currently SOB (can you hear that pt is SOB on the phone)?   2. How long have you been experiencing SOB? Since Sunday when the pt went to [facility]   3. Are you SOB when sitting or when up moving around? All the time   4. Are you currently experiencing any other symptoms? afib  Pt said he went to the Hospital Sunday night because he was in Afib. He was discharged yesterday afternoon but is still in afib. He wanted to know if he could come in and be seen by Dr. Bettina Gavia

## 2020-04-25 NOTE — Telephone Encounter (Signed)
Spoke to the patient and let him know that we will see him in the office on Friday. He verbalizes understanding of this and states that he will be here for the appointment. Dr. Bettina Gavia reviewed his records from [facility].

## 2020-04-27 DIAGNOSIS — I119 Hypertensive heart disease without heart failure: Secondary | ICD-10-CM | POA: Insufficient documentation

## 2020-04-27 NOTE — H&P (View-Only) (Signed)
Cardiology Office Note:    Date:  04/28/2020   ID:  [name], DOB 06/18/1955, MRN [id number]  PCP:  [name], NP  Cardiologist:  Shirlee More, MD    Referring MD: [name], NP    ASSESSMENT:    1. CAD in native artery   2. Ischemic cardiomyopathy   3. Nonrheumatic aortic valve stenosis   4. Familial hyperlipidemia   5. Hypertensive heart disease without heart failure    PLAN:    In order of problems listed above:  1. He is not doing well he has ongoing chest pain atypical but at high risk of needing further revascularization and we will plan on undergoing coronary angiography.  He will continue dual antiplatelet therapy beta-blocker and lipid-lowering. 2. EF is similar to a year ago 36% following coronary angiography will need to transition ACE inhibitor to [facility] with up titration beta-blocker and if stable renal function initiate MRA and SGLT2 inhibitor. 3. Mild aortic stenosis 4. Continue his high intensity statin 5. He will continue taking a diuretic daily he had been taking it as needed   Next appointment: 4 weeks   Medication Adjustments/Labs and Tests Ordered: Current medicines are reviewed at length with the patient today.  Concerns regarding medicines are outlined above.  No orders of the defined types were placed in this encounter.  No orders of the defined types were placed in this encounter.   Chief complaint: The patient called the office the same day discharge from the hospital saying that he was still in atrial fibrillation and needed to be seen.  We quickly accessed his hospital records and reviewed them and he is brought in today in follow-up to that hospital visit.  There was no documented atrial fibrillation.  History of Present Illness:    Craig Rollins is a 64 y.o. male with a hx of CAD PCI and stent to right coronary artery [facility] 03/29/2019 EF of 40% and mild aortic stenosis.  Other problems  include of peripheral arterial disease with a nonhealing wound left lower extremity distal tip of the second third toes.  He has a history of pulmonary embolism.  He was last seen by me 12/14/2019.  He was admitted to [facility] 04/23/2020 and discharged the next day.  His complaint was chest pain serial cardiac enzymes were normal EKG showed no evidence of acute coronary syndrome.  I independently reviewed his EKG 04/24/2020 showing sinus rhythm with frequent atrial premature contractions old septal MI.  He did not have atrial fibrillation chest x-ray was read as normal and he had a myocardial perfusion test done showing EF of 36% inferior inferolateral scar with hypokinesia consistent with scar.  Compliance with diet, lifestyle and medications: Yes  Left heart cath 03/29/2019: FINDINGS:  Severe single vessel CAD  Severe distal RCA stenosis  Patent mid LAd stent  Patent mid RCA stent  Aortic stenosis with peak to peak gradient of 25 mmHg. Mean gradient 19 mmHg  Diagnostic Recommendations  Proceed with PCI to RCA  Interventional Summary  Successful PCI with DES, direct stenting, to distal RAC  Interventional Recommendations   Echocardiogram 03/29/2019 [facility]: Conclusions  Summary  Technically difficult and limited study. LVEF has improved vs. previous echo  report  Mild concentric left ventricular hypertrophy with moderate global hypokinesis  and ejection fraction visually estimated at 72-53%  Diastolic function is indeterminate  The aortic valve leaflets were not well visualized. Transvalvular gradients  are  moderately elevated, mean 18 mmHg peak 36 mmHg probably due to a combination of mild AS and moderate AI  He had done well until the day of hospitalization.  He finds it when he is a big meal he feels full in his chest has palpitation generally and short-lived to need nitroglycerin.  Day admission to the hospital the symptoms were much worse he describes chest pain  is severe some relief with 3 nitroglycerin and fortunately is cardiac enzymes were normal.  He continues to have the same sensation of fullness in his chest and palpitation that waxes and wanes as not exertional in nature.  His myocardial perfusion study showed extensive fixed defect however I am not convinced that this is not ischemia.  I asked him to put a ZIO monitor on today we will take her off next Friday morning and I think he needs undergo coronary angiography is a very high risk of having recurrent need for PCI.  He has been compliant taking his dual antiplatelet therapy and recently takes furosemide daily because he has noticed edema shortness of breath and orthopnea. Past Medical History:  Diagnosis Date  . Aortic stenosis 02/18/2018  . Brain aneurysm   . CAD in native artery 02/18/2018  . Cataract   . Chest discomfort 08/13/2018  . CHF (congestive heart failure) ([place])   . COPD (chronic obstructive pulmonary disease) ([place])   . Diabetes ([place])   . Diabetes mellitus, type 2 ([place]) 01/26/2018  . Diabetic peripheral neuropathy ([place]) 02/25/2017  . Dilated cardiomyopathy ([place]) 07/16/2016   Ejection fraction 30% based on echo from 2018  . Dyslipidemia (high LDL; low HDL) 07/16/2016  . Essential hypertension 02/18/2018  . Foot swelling 01/26/2018  . Hammer toes of both feet 02/25/2017  . High cholesterol   . Hypercholesteremia 11/23/2019  . Myocardial infarction ([place])   . Noncompliance 07/16/2016  . NSTEMI (non-ST elevated myocardial infarction) ([place]) 08/30/2016  . Shortness of breath 08/13/2018  . Smoking 07/16/2016  . Stroke ([place])   . TIA (transient ischemic attack)   . Unstable angina ([place]) 03/30/2019  . Wound eschar of foot 08/21/2016    Past Surgical History:  Procedure Laterality Date  . CORONARY ANGIOPLASTY WITH STENT PLACEMENT    . KNEE SURGERY      Current Medications: No outpatient medications have been marked as taking for the 04/28/20 encounter (Appointment) with Richardo Priest, MD.      Allergies:   Penicillins, Milk protein, and Milk-related compounds   Social History   Socioeconomic History  . Marital status: Single    Spouse name: Not on file  . Number of children: Not on file  . Years of education: Not on file  . Highest education level: Not on file  Occupational History  . Not on file  Tobacco Use  . Smoking status: Current Every Day Smoker    Packs/day: 1.00    Years: 55.00    Pack years: 55.00  . Smokeless tobacco: Never Used  Vaping Use  . Vaping Use: Former  Substance and Sexual Activity  . Alcohol use: Not Currently  . Drug use: Never  . Sexual activity: Not on file  Other Topics Concern  . Not on file  Social History Narrative  . Not on file   Social Determinants of Health   Financial Resource Strain: Not on file  Food Insecurity: Not on file  Transportation Needs: No Transportation Needs  . Lack of Transportation (Medical): No  . Lack of Transportation (Non-Medical): No  Physical Activity: Not on file  Stress: Not on file  Social Connections: Not on file     Family History: The patient's family history includes Breast cancer in his paternal grandmother; Diabetes in his father; Heart attack in his father and paternal grandfather; Heart disease in his father; Hyperlipidemia in his father; Hypertension in his father; Liver cancer in his maternal grandfather. ROS:   Please see the history of present illness.    All other systems reviewed and are negative.  EKGs/Labs/Other Studies Reviewed:    The following studies were reviewed today:  EKG:  EKG ordered today and personally reviewed.  The ekg ordered today demonstrates sinus rhythm LVH repolarization   Recent Lipid Panel  02/22/2020: Cholesterol 138 LDL 83 triglycerides 114 HDL 34 A1c 7.3% creatinine 1.3    Component Value Date/Time   CHOL 107 04/01/2018 1619   TRIG 116 04/01/2018 1619   HDL 32 (L) 04/01/2018 1619   CHOLHDL 3.3 04/01/2018 1619   LDLCALC 52 04/01/2018  1619    Physical Exam:    VS:  There were no vitals taken for this visit.    Wt Readings from Last 3 Encounters:  12/14/19 (!) 275 lb 3.2 oz (124.8 kg)  04/01/18 273 lb 9.6 oz (124.1 kg)  03/24/18 275 lb 3.2 oz (124.8 kg)     GEN:  Well nourished, well developed in no acute distress HEENT: Normal NECK: No JVD; No carotid bruits LYMPHATICS: No lymphadenopathy CARDIAC: RRR, no murmurs, rubs, gallops RESPIRATORY:  Clear to auscultation without rales, wheezing or rhonchi  ABDOMEN: Soft, non-tender, non-distended MUSCULOSKELETAL: 1+ bilateral lower extremity pitting edema; No deformity  SKIN: Warm and dry NEUROLOGIC:  Alert and oriented x 3 PSYCHIATRIC:  Normal affect    Signed, Shirlee More, MD  04/28/2020 10:27 AM    [place]

## 2020-04-27 NOTE — Progress Notes (Signed)
Cardiology Office Note:    Date:  04/28/2020   ID:  [name], DOB [birth year], MRN [id number]  PCP:  [name], NP  Cardiologist:  Shirlee More, MD    Referring MD: [name], NP    ASSESSMENT:    1. CAD in native artery   2. Ischemic cardiomyopathy   3. Nonrheumatic aortic valve stenosis   4. Familial hyperlipidemia   5. Hypertensive heart disease without heart failure    PLAN:    In order of problems listed above:  1. He is not doing well he has ongoing chest pain atypical but at high risk of needing further revascularization and we will plan on undergoing coronary angiography.  He will continue dual antiplatelet therapy beta-blocker and lipid-lowering. 2. EF is similar to a year ago 36% following coronary angiography will need to transition ACE inhibitor to [facility] with up titration beta-blocker and if stable renal function initiate MRA and SGLT2 inhibitor. 3. Mild aortic stenosis 4. Continue his high intensity statin 5. He will continue taking a diuretic daily he had been taking it as needed   Next appointment: 4 weeks   Medication Adjustments/Labs and Tests Ordered: Current medicines are reviewed at length with the patient today.  Concerns regarding medicines are outlined above.  No orders of the defined types were placed in this encounter.  No orders of the defined types were placed in this encounter.   Chief complaint: The patient called the office the same day discharge from the hospital saying that he was still in atrial fibrillation and needed to be seen.  We quickly accessed his hospital records and reviewed them and he is brought in today in follow-up to that hospital visit.  There was no documented atrial fibrillation.  History of Present Illness:    Craig Rollins is a 64 y.o. male with a hx of CAD PCI and stent to right coronary artery [facility] 03/29/2019 EF of 40% and mild aortic stenosis.  Other problems  include of peripheral arterial disease with a nonhealing wound left lower extremity distal tip of the second third toes.  He has a history of pulmonary embolism.  He was last seen by me 12/14/2019.  He was admitted to [facility] 04/23/2020 and discharged the next day.  His complaint was chest pain serial cardiac enzymes were normal EKG showed no evidence of acute coronary syndrome.  I independently reviewed his EKG 04/24/2020 showing sinus rhythm with frequent atrial premature contractions old septal MI.  He did not have atrial fibrillation chest x-ray was read as normal and he had a myocardial perfusion test done showing EF of 36% inferior inferolateral scar with hypokinesia consistent with scar.  Compliance with diet, lifestyle and medications: Yes  Left heart cath 03/29/2019: FINDINGS:  Severe single vessel CAD  Severe distal RCA stenosis  Patent mid LAd stent  Patent mid RCA stent  Aortic stenosis with peak to peak gradient of 25 mmHg. Mean gradient 19 mmHg  Diagnostic Recommendations  Proceed with PCI to RCA  Interventional Summary  Successful PCI with DES, direct stenting, to distal RAC  Interventional Recommendations   Echocardiogram 03/29/2019 [facility]: Conclusions  Summary  Technically difficult and limited study. LVEF has improved vs. previous echo  report  Mild concentric left ventricular hypertrophy with moderate global hypokinesis  and ejection fraction visually estimated at 76-16%  Diastolic function is indeterminate  The aortic valve leaflets were not well visualized. Transvalvular gradients  are  moderately elevated, mean 18 mmHg peak 36 mmHg probably due to a combination of mild AS and moderate AI  He had done well until the day of hospitalization.  He finds it when he is a big meal he feels full in his chest has palpitation generally and short-lived to need nitroglycerin.  Day admission to the hospital the symptoms were much worse he describes chest pain  is severe some relief with 3 nitroglycerin and fortunately is cardiac enzymes were normal.  He continues to have the same sensation of fullness in his chest and palpitation that waxes and wanes as not exertional in nature.  His myocardial perfusion study showed extensive fixed defect however I am not convinced that this is not ischemia.  I asked him to put a ZIO monitor on today we will take her off next Friday morning and I think he needs undergo coronary angiography is a very high risk of having recurrent need for PCI.  He has been compliant taking his dual antiplatelet therapy and recently takes furosemide daily because he has noticed edema shortness of breath and orthopnea. Past Medical History:  Diagnosis Date  . Aortic stenosis 02/18/2018  . Brain aneurysm   . CAD in native artery 02/18/2018  . Cataract   . Chest discomfort 08/13/2018  . CHF (congestive heart failure) ([place])   . COPD (chronic obstructive pulmonary disease) ([place])   . Diabetes ([place])   . Diabetes mellitus, type 2 ([place]) 01/26/2018  . Diabetic peripheral neuropathy ([place]) 02/25/2017  . Dilated cardiomyopathy ([place]) 07/16/2016   Ejection fraction 30% based on echo from 2018  . Dyslipidemia (high LDL; low HDL) 07/16/2016  . Essential hypertension 02/18/2018  . Foot swelling 01/26/2018  . Hammer toes of both feet 02/25/2017  . High cholesterol   . Hypercholesteremia 11/23/2019  . Myocardial infarction ([place])   . Noncompliance 07/16/2016  . NSTEMI (non-ST elevated myocardial infarction) ([place]) 08/30/2016  . Shortness of breath 08/13/2018  . Smoking 07/16/2016  . Stroke ([place])   . TIA (transient ischemic attack)   . Unstable angina ([place]) 03/30/2019  . Wound eschar of foot 08/21/2016    Past Surgical History:  Procedure Laterality Date  . CORONARY ANGIOPLASTY WITH STENT PLACEMENT    . KNEE SURGERY      Current Medications: No outpatient medications have been marked as taking for the 04/28/20 encounter (Appointment) with Richardo Priest, MD.      Allergies:   Penicillins, Milk protein, and Milk-related compounds   Social History   Socioeconomic History  . Marital status: Single    Spouse name: Not on file  . Number of children: Not on file  . Years of education: Not on file  . Highest education level: Not on file  Occupational History  . Not on file  Tobacco Use  . Smoking status: Current Every Day Smoker    Packs/day: 1.00    Years: 55.00    Pack years: 55.00  . Smokeless tobacco: Never Used  Vaping Use  . Vaping Use: Former  Substance and Sexual Activity  . Alcohol use: Not Currently  . Drug use: Never  . Sexual activity: Not on file  Other Topics Concern  . Not on file  Social History Narrative  . Not on file   Social Determinants of Health   Financial Resource Strain: Not on file  Food Insecurity: Not on file  Transportation Needs: No Transportation Needs  . Lack of Transportation (Medical): No  . Lack of Transportation (Non-Medical): No  Physical Activity: Not on file  Stress: Not on file  Social Connections: Not on file     Family History: The patient's family history includes Breast cancer in his paternal grandmother; Diabetes in his father; Heart attack in his father and paternal grandfather; Heart disease in his father; Hyperlipidemia in his father; Hypertension in his father; Liver cancer in his maternal grandfather. ROS:   Please see the history of present illness.    All other systems reviewed and are negative.  EKGs/Labs/Other Studies Reviewed:    The following studies were reviewed today:  EKG:  EKG ordered today and personally reviewed.  The ekg ordered today demonstrates sinus rhythm LVH repolarization   Recent Lipid Panel  02/22/2020: Cholesterol 138 LDL 83 triglycerides 114 HDL 34 A1c 7.3% creatinine 1.3    Component Value Date/Time   CHOL 107 04/01/2018 1619   TRIG 116 04/01/2018 1619   HDL 32 (L) 04/01/2018 1619   CHOLHDL 3.3 04/01/2018 1619   LDLCALC 52 04/01/2018  1619    Physical Exam:    VS:  There were no vitals taken for this visit.    Wt Readings from Last 3 Encounters:  12/14/19 (!) 275 lb 3.2 oz (124.8 kg)  04/01/18 273 lb 9.6 oz (124.1 kg)  03/24/18 275 lb 3.2 oz (124.8 kg)     GEN:  Well nourished, well developed in no acute distress HEENT: Normal NECK: No JVD; No carotid bruits LYMPHATICS: No lymphadenopathy CARDIAC: RRR, no murmurs, rubs, gallops RESPIRATORY:  Clear to auscultation without rales, wheezing or rhonchi  ABDOMEN: Soft, non-tender, non-distended MUSCULOSKELETAL: 1+ bilateral lower extremity pitting edema; No deformity  SKIN: Warm and dry NEUROLOGIC:  Alert and oriented x 3 PSYCHIATRIC:  Normal affect    Signed, Shirlee More, MD  04/28/2020 10:27 AM    [place]

## 2020-04-28 ENCOUNTER — Ambulatory Visit (INDEPENDENT_AMBULATORY_CARE_PROVIDER_SITE_OTHER): Payer: Medicare HMO | Admitting: Cardiology

## 2020-04-28 ENCOUNTER — Ambulatory Visit (INDEPENDENT_AMBULATORY_CARE_PROVIDER_SITE_OTHER): Payer: Medicare HMO

## 2020-04-28 ENCOUNTER — Encounter: Payer: Self-pay | Admitting: Cardiology

## 2020-04-28 ENCOUNTER — Other Ambulatory Visit: Payer: Self-pay

## 2020-04-28 VITALS — BP 114/77 | HR 96 | Ht 76.0 in | Wt 279.6 lb

## 2020-04-28 DIAGNOSIS — E7849 Other hyperlipidemia: Secondary | ICD-10-CM

## 2020-04-28 DIAGNOSIS — I350 Nonrheumatic aortic (valve) stenosis: Secondary | ICD-10-CM | POA: Diagnosis not present

## 2020-04-28 DIAGNOSIS — I2510 Atherosclerotic heart disease of native coronary artery without angina pectoris: Secondary | ICD-10-CM | POA: Diagnosis not present

## 2020-04-28 DIAGNOSIS — R002 Palpitations: Secondary | ICD-10-CM

## 2020-04-28 DIAGNOSIS — I119 Hypertensive heart disease without heart failure: Secondary | ICD-10-CM

## 2020-04-28 DIAGNOSIS — I255 Ischemic cardiomyopathy: Secondary | ICD-10-CM

## 2020-04-28 NOTE — Patient Instructions (Signed)
Medication Instructions:  Your physician recommends that you continue on your current medications as directed. Please refer to the Current Medication list given to you today.  *If you need a refill on your cardiac medications before your next appointment, please call your pharmacy*   Lab Work: Your physician recommends that you return for lab work in: TODAY BMP, ProBNP If you have labs (blood work) drawn today and your tests are completely normal, you will receive your results only by: Marland Kitchen MyChart Message (if you have MyChart) OR . A paper copy in the mail If you have any lab test that is abnormal or we need to change your treatment, we will call you to review the results.   Testing/Procedures:    [place] [id number] Dept: [phone] Loc: [phone]  Zalyn Amend  04/28/2020  You are scheduled for a Cardiac Catheterization on Friday, December 17 with Dr. [name].  1. Please arrive at the [facility] (Main Entrance A) at [facility]: [address] [place], [place] [address] at 5:30 AM (This time is two hours before your procedure to ensure your preparation). Free valet parking service is available.   Special note: Every effort is made to have your procedure done on time. Please understand that emergencies sometimes delay scheduled procedures.  2. Diet: Do not eat solid foods after midnight.  The patient may have clear liquids until 5am upon the day of the procedure.  3. Labs: You will need to have blood drawn on TODAY 04/28/2020  4. Medication instructions in preparation for your procedure:   Contrast Allergy: No  Do not take Diabetes Med Glucophage (Metformin) on the day of the procedure and HOLD 48 HOURS AFTER THE PROCEDURE.  On the morning of your procedure, take your Aspirin and any morning medicines NOT listed above.  You may use  sips of water.  5. Plan for one night stay--bring personal belongings. 6. Bring a current list of your medications and current insurance cards. 7. You MUST have a responsible person to drive you home. 8. Someone MUST be with you the first 24 hours after you arrive home or your discharge will be delayed. 9. Please wear clothes that are easy to get on and off and wear slip-on shoes.  Thank you for allowing [place] to care for you!   -- [facility]    Follow-Up: At [facility], you and your health needs are our priority.  As part of our continuing mission to provide you with exceptional heart care, we have created designated Provider Care Teams.  These Care Teams include your primary Cardiologist (physician) and Advanced Practice Providers (APPs -  Physician Assistants and Nurse Practitioners) who all work together to provide you with the care you need, when you need it.  We recommend signing up for the patient portal called "MyChart".  Sign up information is provided on this After Visit Summary.  MyChart is used to connect with patients for Virtual Visits (Telemedicine).  Patients are able to view lab/test results, encounter notes, upcoming appointments, etc.  Non-urgent messages can be sent to your provider as well.   To learn more about what you can do with MyChart, go to [email or web address].    Your next appointment:   4 week(s)  The format for your next appointment:   In Person  Provider:   Shirlee More, MD   Other Instructions

## 2020-04-29 LAB — CBC
Hematocrit: 44 % (ref 37.5–51.0)
Hemoglobin: 14.1 g/dL (ref 13.0–17.7)
MCH: 29.3 pg (ref 26.6–33.0)
MCHC: 32 g/dL (ref 31.5–35.7)
MCV: 92 fL (ref 79–97)
Platelets: 232 10*3/uL (ref 150–450)
RBC: 4.81 x10E6/uL (ref 4.14–5.80)
RDW: 13.7 % (ref 11.6–15.4)
WBC: 9.3 10*3/uL (ref 3.4–10.8)

## 2020-04-29 LAB — PRO B NATRIURETIC PEPTIDE: NT-Pro BNP: 327 pg/mL — ABNORMAL HIGH (ref 0–210)

## 2020-04-29 LAB — BASIC METABOLIC PANEL
BUN/Creatinine Ratio: 17 (ref 10–24)
BUN: 22 mg/dL (ref 8–27)
CO2: 25 mmol/L (ref 20–29)
Calcium: 9.5 mg/dL (ref 8.6–10.2)
Chloride: 107 mmol/L — ABNORMAL HIGH (ref 96–106)
Creatinine, Ser: 1.32 mg/dL — ABNORMAL HIGH (ref 0.76–1.27)
GFR calc Af Amer: 66 mL/min/{1.73_m2} (ref >59)
GFR calc non Af Amer: 57 mL/min/{1.73_m2} — ABNORMAL LOW (ref >59)
Glucose: 142 mg/dL — ABNORMAL HIGH (ref 65–99)
Potassium: 4.5 mmol/L (ref 3.5–5.2)
Sodium: 144 mmol/L (ref 134–144)

## 2020-05-01 ENCOUNTER — Telehealth: Payer: Self-pay

## 2020-05-01 NOTE — Telephone Encounter (Signed)
-----   Message from Richardo Priest, MD sent at 04/30/2020  2:13 PM EST ----- Good result for left heart cath

## 2020-05-01 NOTE — Telephone Encounter (Signed)
Spoke with patient regarding results and recommendation.  Patient verbalizes understanding and is agreeable to plan of care. Advised patient to call back with any issues or concerns.  

## 2020-05-03 ENCOUNTER — Ambulatory Visit: Payer: Medicare HMO | Admitting: Sports Medicine

## 2020-05-03 ENCOUNTER — Other Ambulatory Visit: Payer: Self-pay | Admitting: Sports Medicine

## 2020-05-03 ENCOUNTER — Other Ambulatory Visit (HOSPITAL_COMMUNITY)
Admission: RE | Admit: 2020-05-03 | Discharge: 2020-05-03 | Disposition: A | Discharge status: Home / Self Care | Payer: Medicare HMO | Source: Ambulatory Visit | Attending: Cardiovascular Disease | Admitting: Cardiovascular Disease

## 2020-05-03 DIAGNOSIS — Z01812 Encounter for preprocedural laboratory examination: Secondary | ICD-10-CM | POA: Diagnosis present

## 2020-05-03 DIAGNOSIS — Z20822 Contact with and (suspected) exposure to covid-19: Secondary | ICD-10-CM | POA: Insufficient documentation

## 2020-05-03 DIAGNOSIS — M79672 Pain in left foot: Secondary | ICD-10-CM

## 2020-05-03 LAB — SARS CORONAVIRUS 2 (TAT 6-24 HRS): SARS Coronavirus 2: NEGATIVE

## 2020-05-04 ENCOUNTER — Telehealth: Payer: Self-pay | Admitting: *Deleted

## 2020-05-04 NOTE — Telephone Encounter (Signed)
Pt contacted pre-catheterization scheduled at [facility] for: Friday May 05, 2020 7:30 AM Verified arrival time and place: [facility]) at: 5:30 AM   No solid food after midnight prior to cath, clear liquids until 5 AM day of procedure.  Hold: Metformin-day of procedure and 48 hours post procedure Glipizide -AM of procedure Treshiba-1/2 usual dose HS prior to procedure Lasix-AM of procedure-GFR 57-already taken today Lisinopril-AM of procedure-GFR 57-already taken today  Except hold medications AM meds can be  taken pre-cath with sips of water including: ASA 81 mg Plavix 75 mg  Confirmed patient has responsible adult to drive home post procedure and be with patient first 24 hours after arriving home: yes  You are allowed ONE visitor in the waiting room during the time you are at the hospital for your procedure. Both you and your visitor must wear a mask once you enter the hospital.       COVID-19 Pre-Screening Questions:  . In the past 14 days have you had any symptoms concerning for COVID-19 infection (fever, chills, cough, or new shortness of breath)? no . In the past 14 days have you been around anyone with known Covid 19? no   Reviewed procedure/mask/visitor instructions, COVID-19 questions reviewed with patient.

## 2020-05-05 ENCOUNTER — Other Ambulatory Visit: Payer: Self-pay

## 2020-05-05 ENCOUNTER — Ambulatory Visit (HOSPITAL_COMMUNITY)
Admission: RE | Admit: 2020-05-05 | Discharge: 2020-05-05 | Disposition: A | Discharge status: Home / Self Care | Payer: Medicare HMO | Attending: Cardiovascular Disease | Admitting: Cardiovascular Disease

## 2020-05-05 ENCOUNTER — Encounter (HOSPITAL_COMMUNITY)
Admission: RE | Disposition: A | Discharge status: Home / Self Care | Payer: Self-pay | Source: Home / Self Care | Attending: Cardiovascular Disease

## 2020-05-05 ENCOUNTER — Encounter (HOSPITAL_COMMUNITY): Payer: Self-pay | Admitting: Cardiovascular Disease

## 2020-05-05 DIAGNOSIS — Z955 Presence of coronary angioplasty implant and graft: Secondary | ICD-10-CM | POA: Diagnosis not present

## 2020-05-05 DIAGNOSIS — I350 Nonrheumatic aortic (valve) stenosis: Secondary | ICD-10-CM | POA: Diagnosis not present

## 2020-05-05 DIAGNOSIS — I119 Hypertensive heart disease without heart failure: Secondary | ICD-10-CM | POA: Diagnosis not present

## 2020-05-05 DIAGNOSIS — I2510 Atherosclerotic heart disease of native coronary artery without angina pectoris: Secondary | ICD-10-CM | POA: Diagnosis present

## 2020-05-05 DIAGNOSIS — F17210 Nicotine dependence, cigarettes, uncomplicated: Secondary | ICD-10-CM | POA: Insufficient documentation

## 2020-05-05 DIAGNOSIS — R002 Palpitations: Secondary | ICD-10-CM

## 2020-05-05 DIAGNOSIS — R079 Chest pain, unspecified: Secondary | ICD-10-CM

## 2020-05-05 DIAGNOSIS — Z880 Allergy status to penicillin: Secondary | ICD-10-CM | POA: Insufficient documentation

## 2020-05-05 DIAGNOSIS — I255 Ischemic cardiomyopathy: Secondary | ICD-10-CM

## 2020-05-05 DIAGNOSIS — E7849 Other hyperlipidemia: Secondary | ICD-10-CM | POA: Diagnosis not present

## 2020-05-05 HISTORY — PX: LEFT HEART CATH AND CORONARY ANGIOGRAPHY: CATH118249

## 2020-05-05 LAB — GLUCOSE, CAPILLARY: Glucose-Capillary: 220 mg/dL — ABNORMAL HIGH (ref 70–99)

## 2020-05-05 SURGERY — LEFT HEART CATH AND CORONARY ANGIOGRAPHY
Anesthesia: LOCAL

## 2020-05-05 MED ORDER — CLOPIDOGREL BISULFATE 75 MG PO TABS
75.0000 mg | ORAL_TABLET | Freq: Once | ORAL | Status: AC
  Administered 2020-05-05: 75 mg via ORAL
  Filled 2020-05-05: qty 1

## 2020-05-05 MED ORDER — SODIUM CHLORIDE 0.9% FLUSH: 3.0000 mL | Freq: Two times a day (BID) | INTRAVENOUS | Status: DC

## 2020-05-05 MED ORDER — SODIUM CHLORIDE 0.9 % IV SOLN: 250.0000 mL | INTRAVENOUS | Status: DC | PRN

## 2020-05-05 MED ORDER — HEPARIN (PORCINE) IN NACL 1000-0.9 UT/500ML-% IV SOLN
INTRAVENOUS | Status: DC | PRN
  Administered 2020-05-05 (×2): 500 mL

## 2020-05-05 MED ORDER — HEPARIN SODIUM (PORCINE) 1000 UNIT/ML IJ SOLN
INTRAMUSCULAR | Status: DC | PRN
  Administered 2020-05-05: 6000 [IU] via INTRAVENOUS

## 2020-05-05 MED ORDER — ONDANSETRON HCL 4 MG/2ML IJ SOLN: 4.0000 mg | Freq: Four times a day (QID) | INTRAMUSCULAR | Status: DC | PRN

## 2020-05-05 MED ORDER — ISOSORBIDE MONONITRATE ER 30 MG PO TB24: 30.0000 mg | ORAL_TABLET | Freq: Every day | ORAL | 3 refills | Status: DC

## 2020-05-05 MED ORDER — LIDOCAINE HCL (PF) 1 % IJ SOLN
INTRAMUSCULAR | Status: DC | PRN
  Administered 2020-05-05: 2 mL

## 2020-05-05 MED ORDER — IOHEXOL 350 MG/ML SOLN
INTRAVENOUS | Status: DC | PRN
  Administered 2020-05-05: 60 mL via INTRA_ARTERIAL

## 2020-05-05 MED ORDER — MIDAZOLAM HCL 2 MG/2ML IJ SOLN
INTRAMUSCULAR | Status: DC | PRN
  Administered 2020-05-05: 1 mg via INTRAVENOUS

## 2020-05-05 MED ORDER — SODIUM CHLORIDE 0.9 % IV SOLN: INTRAVENOUS | Status: DC

## 2020-05-05 MED ORDER — ISOSORBIDE MONONITRATE ER 30 MG PO TB24
30.0000 mg | ORAL_TABLET | Freq: Once | ORAL | Status: AC
  Administered 2020-05-05: 30 mg via ORAL
  Filled 2020-05-05: qty 1

## 2020-05-05 MED ORDER — ACETAMINOPHEN 325 MG PO TABS: 650.0000 mg | ORAL_TABLET | ORAL | Status: DC | PRN

## 2020-05-05 MED ORDER — VERAPAMIL HCL 2.5 MG/ML IV SOLN: INTRAVENOUS | Status: DC | PRN

## 2020-05-05 MED ORDER — ASPIRIN 81 MG PO CHEW: 81.0000 mg | CHEWABLE_TABLET | ORAL | Status: DC

## 2020-05-05 MED ORDER — SODIUM CHLORIDE 0.9% FLUSH: 3.0000 mL | INTRAVENOUS | Status: DC | PRN

## 2020-05-05 MED ORDER — NITROGLYCERIN 0.4 MG SL SUBL: 0.4000 mg | SUBLINGUAL_TABLET | SUBLINGUAL | Status: DC | PRN

## 2020-05-05 MED ORDER — NITROGLYCERIN 0.4 MG SL SUBL
SUBLINGUAL_TABLET | SUBLINGUAL | Status: AC
  Administered 2020-05-05: 0.4 mg
  Administered 2020-05-05: 0.4 mg via SUBLINGUAL
  Filled 2020-05-05: qty 1

## 2020-05-05 MED ORDER — LABETALOL HCL 5 MG/ML IV SOLN: 10.0000 mg | INTRAVENOUS | Status: DC | PRN

## 2020-05-05 MED ORDER — FENTANYL CITRATE (PF) 100 MCG/2ML IJ SOLN
INTRAMUSCULAR | Status: DC | PRN
  Administered 2020-05-05: 25 ug via INTRAVENOUS

## 2020-05-05 MED ORDER — SODIUM CHLORIDE 0.9 % IV SOLN: INTRAVENOUS | Status: AC

## 2020-05-05 MED ORDER — HYDRALAZINE HCL 20 MG/ML IJ SOLN: 10.0000 mg | INTRAMUSCULAR | Status: DC | PRN

## 2020-05-05 SURGICAL SUPPLY — 11 items
CATH 5FR JL3.5 JR4 ANG PIG MP (CATHETERS) ×2 IMPLANT
CATH INFINITI 5FR AL1 (CATHETERS) ×2 IMPLANT
DEVICE RAD COMP TR BAND LRG (VASCULAR PRODUCTS) ×2 IMPLANT
GLIDESHEATH SLEND SS 6F .021 (SHEATH) ×2 IMPLANT
GUIDEWIRE INQWIRE 1.5J.035X260 (WIRE) ×1 IMPLANT
INQWIRE 1.5J .035X260CM (WIRE) ×2
KIT HEART LEFT (KITS) ×2 IMPLANT
PACK CARDIAC CATHETERIZATION (CUSTOM PROCEDURE TRAY) ×2 IMPLANT
TRANSDUCER W/STOPCOCK (MISCELLANEOUS) ×2 IMPLANT
TUBING CIL FLEX 10 FLL-RA (TUBING) ×2 IMPLANT
WIRE EMERALD ST .035X150CM (WIRE) ×2 IMPLANT

## 2020-05-05 NOTE — Progress Notes (Signed)
Discharge instructions reviewed with patient and friend Otho Ket. Verbalized understanding and need for her to stay with him for the first 24 hours.

## 2020-05-05 NOTE — Interval H&P Note (Signed)
History and Physical Interval Note:  05/05/2020 7:20 AM  Craig Rollins  has presented today for surgery, with the diagnosis of CAD.  The various methods of treatment have been discussed with the patient and family. After consideration of risks, benefits and other options for treatment, the patient has consented to  Procedure(s): LEFT HEART CATH AND CORONARY ANGIOGRAPHY (N/A) as a surgical intervention.  The patient's history has been reviewed, patient examined, no change in status, stable for surgery.  I have reviewed the patient's chart and labs.  Questions were answered to the patient's satisfaction.    Cath Lab Visit (complete for each Cath Lab visit)  Clinical Evaluation Leading to the Procedure:   ACS: No.  Non-ACS:    Anginal Classification: CCS III  Anti-ischemic medical therapy: Minimal Therapy (1 class of medications)  Non-Invasive Test Results: No non-invasive testing performed  Prior CABG: No previous CABG        Craig Rollins

## 2020-05-05 NOTE — Progress Notes (Addendum)
PATIENT C/ O CHEST PAIN ANS SOB. PATIENT GIVEN 2 SL ntg TOTAL , ekg DONE , 4 LITERS O2 ON. PATIENT STATES PAIN IS GONE.  Paged Dr. Angelena Form. Notified Aaron Edelman in cath lab of previos episode of chestpain. Relief after 2 SL NTG.

## 2020-05-05 NOTE — Discharge Instructions (Signed)
Hold metformin for 48 hours post cath. Drink plenty of fluids for the next 24 hours.    Radial Site Care  This sheet gives you information about how to care for yourself after your procedure. Your health care provider may also give you more specific instructions. If you have problems or questions, contact your health care provider. What can I expect after the procedure? After the procedure, it is common to have:  Bruising and tenderness at the catheter insertion area. Follow these instructions at home: Medicines  Take over-the-counter and prescription medicines only as told by your health care provider. Insertion site care  Follow instructions from your health care provider about how to take care of your insertion site. Make sure you: ? Wash your hands with soap and water before you change your bandage (dressing). If soap and water are not available, use hand sanitizer. ? Change your dressing as told by your health care provider. ? Leave stitches (sutures), skin glue, or adhesive strips in place. These skin closures may need to stay in place for 2 weeks or longer. If adhesive strip edges start to loosen and curl up, you may trim the loose edges. Do not remove adhesive strips completely unless your health care provider tells you to do that.  Check your insertion site every day for signs of infection. Check for: ? Redness, swelling, or pain. ? Fluid or blood. ? Pus or a bad smell. ? Warmth.  Do not take baths, swim, or use a hot tub until your health care provider approves.  You may shower 24-48 hours after the procedure, or as directed by your health care provider. ? Remove the dressing and gently wash the site with plain soap and water. ? Pat the area dry with a clean towel. ? Do not rub the site. That could cause bleeding.  Do not apply powder or lotion to the site. Activity   For 24 hours after the procedure, or as directed by your health care provider: ? Do not flex or bend  the affected arm. ? Do not push or pull heavy objects with the affected arm. ? Do not drive yourself home from the hospital or clinic. You may drive 24 hours after the procedure unless your health care provider tells you not to. ? Do not operate machinery or power tools.  Do not lift anything that is heavier than 10 lb (4.5 kg), or the limit that you are told, until your health care provider says that it is safe.  Ask your health care provider when it is okay to: ? Return to work or school. ? Resume usual physical activities or sports. ? Resume sexual activity. General instructions  If the catheter site starts to bleed, raise your arm and put firm pressure on the site. If the bleeding does not stop, get help right away. This is a medical emergency.  If you went home on the same day as your procedure, a responsible adult should be with you for the first 24 hours after you arrive home.  Keep all follow-up visits as told by your health care provider. This is important. Contact a health care provider if:  You have a fever.  You have redness, swelling, or yellow drainage around your insertion site. Get help right away if:  You have unusual pain at the radial site.  The catheter insertion area swells very fast.  The insertion area is bleeding, and the bleeding does not stop when you hold steady pressure on the  area.  Your arm or hand becomes pale, cool, tingly, or numb. These symptoms may represent a serious problem that is an emergency. Do not wait to see if the symptoms will go away. Get medical help right away. Call your local emergency services (911 in the [place] Do not drive yourself to the hospital. Summary  After the procedure, it is common to have bruising and tenderness at the site.  Follow instructions from your health care provider about how to take care of your radial site wound. Check the wound every day for signs of infection.  Do not lift anything that is heavier than  10 lb (4.5 kg), or the limit that you are told, until your health care provider says that it is safe. This information is not intended to replace advice given to you by your health care provider. Make sure you discuss any questions you have with your health care provider. Document Revised: 06/11/2017 Document Reviewed: 06/11/2017 Elsevier Patient Education  2020 [employer].

## 2020-06-05 ENCOUNTER — Ambulatory Visit: Payer: Medicare HMO | Admitting: Cardiology

## 2020-06-05 ENCOUNTER — Telehealth: Payer: Self-pay

## 2020-06-05 NOTE — Telephone Encounter (Signed)
Left vm to call back for results.  

## 2020-06-06 ENCOUNTER — Ambulatory Visit: Payer: Medicare HMO | Admitting: Sports Medicine

## 2020-06-09 NOTE — Progress Notes (Signed)
Cardiology Office Note:    Date:  06/12/2020   ID:  Craig Rollins, DOB [birth year], MRN [id number]  PCP:  [name], NP  Cardiologist:  Shirlee More, MD    Referring MD: [name], NP    ASSESSMENT:    1. CAD in native artery   2. Ischemic cardiomyopathy   3. Hypertensive heart disease without heart failure   4. Familial hyperlipidemia   5. Nonrheumatic aortic valve stenosis    PLAN:    In order of problems listed above:  1. Stable continue medical treatment we will put his oral mononitrate to the side unless he is having frequent angina.  He is reassured. 2. Stable continue minimum dose carvedilol ACE inhibitor reduced with hypotension 3. See above well compensated continue his current loop diuretic 4. Stable continue medical therapy high intensity statin 5. Mild stenosis echocardiogram 1 year   Next appointment: 6 months   Medication Adjustments/Labs and Tests Ordered: Current medicines are reviewed at length with the patient today.  Concerns regarding medicines are outlined above.  No orders of the defined types were placed in this encounter.  No orders of the defined types were placed in this encounter.   Chief Complaint  Patient presents with  . Follow-up    History of Present Illness:    Craig Rollins is a 65 y.o. male with a hx of CAD with PCI and stent right coronary artery [facility] 03/29/2019 with mild aortic stenosis and LV dysfunction EF of 40%, peripheral arterial disease previous pulmonary embolism.  He was last seen 04/28/2020 in follow-up to hospitalization.He was admitted to [facility] 04/23/2020 and discharged the next day.  His complaint was chest pain serial cardiac enzymes were normal EKG showed no evidence of acute coronary syndrome.  I independently reviewed his EKG 04/24/2020 showing sinus rhythm with frequent atrial premature contractions old septal MI.  He did not have atrial fibrillation chest  x-ray was read as normal and he had a myocardial perfusion test done showing EF of 36% inferior inferolateral scar with hypokinesia consistent with scar.   He underwent left heart catheterization 05/05/2020 showing patent stents in the LAD and right coronary artery mild to moderate mid LAD stenosis not flow-limiting and no gradient across the aortic valve.  He is advised ongoing medical treatment and oral mononitrate was added to his regimen.  Coronary Diagrams   Diagnostic Dominance: Right      03/20/2020 ejection fraction 40 to 45% with global hypokinesia and moderate aortic stenosis.  During catheterization he had a 24 mm peak gradient LV to aorta.  Compliance with diet, lifestyle and medications: Yes  He checks home blood pressure and not infrequently gets systolics of 12-878 but not lightheaded or weak. He would like to stop taking oral mononitrate I told him he could but if he is having frequent angina we will resume it. With systolic of 90 we will decrease his ACE inhibitor 50% and continue low-dose beta-blocker and ACE inhibitor with LV dysfunction. I reviewed the results of his coronary angiogram and echocardiogram with him. No edema shortness of breath chest pain palpitation or syncope. Past Medical History:  Diagnosis Date  . Aortic stenosis 02/18/2018  . Brain aneurysm   . CAD in native artery 02/18/2018  . Cataract   . Chest discomfort 08/13/2018  . CHF (congestive heart failure) ([place])   . COPD (chronic obstructive pulmonary disease) ([place])   . Diabetes ([place])   . Diabetes mellitus,  type 2 ([place]) 01/26/2018  . Diabetic peripheral neuropathy ([place]) 02/25/2017  . Dilated cardiomyopathy ([place]) 07/16/2016   Ejection fraction 30% based on echo from 2018  . Dyslipidemia (high LDL; low HDL) 07/16/2016  . Essential hypertension 02/18/2018  . Foot swelling 01/26/2018  . Hammer toes of both feet 02/25/2017  . High cholesterol   . Hypercholesteremia 11/23/2019  . Myocardial infarction ([place])    . Noncompliance 07/16/2016  . NSTEMI (non-ST elevated myocardial infarction) ([place]) 08/30/2016  . Shortness of breath 08/13/2018  . Smoking 07/16/2016  . Stroke ([place])   . TIA (transient ischemic attack)   . Unstable angina ([place]) 03/30/2019  . Wound eschar of foot 08/21/2016    Past Surgical History:  Procedure Laterality Date  . CORONARY ANGIOPLASTY WITH STENT PLACEMENT    . KNEE SURGERY    . LEFT HEART CATH AND CORONARY ANGIOGRAPHY N/A 05/05/2020   Procedure: LEFT HEART CATH AND CORONARY ANGIOGRAPHY;  Surgeon: Burnell Blanks, MD;  Location: [facility];  Service: Cardiovascular;  Laterality: N/A;    Current Medications: Current Meds  Medication Sig  . albuterol (VENTOLIN HFA) 108 (90 Base) MCG/ACT inhaler Inhale 2 puffs into the lungs every 6 (six) hours as needed for wheezing or shortness of breath.  Marland Kitchen aspirin EC 81 MG tablet Take 81 mg by mouth daily. Swallow whole.  Marland Kitchen atorvastatin (LIPITOR) 40 MG tablet Take 40 mg by mouth daily.  . carvedilol (COREG) 3.125 MG tablet Take 6.25 mg by mouth every 12 (twelve) hours.  . clopidogrel (PLAVIX) 75 MG tablet Take 1 tablet (75 mg total) by mouth daily.  . diphenhydrAMINE (BENADRYL) 25 MG tablet Take 25 mg by mouth 2 (two) times daily.  . finasteride (PROSCAR) 5 MG tablet Take 5 mg by mouth daily.  . furosemide (LASIX) 40 MG tablet Take 40 mg by mouth daily as needed for fluid.  Marland Kitchen glipiZIDE (GLUCOTROL) 5 MG tablet Take 2.5 mg by mouth 2 (two) times daily before a meal.  . lisinopril (ZESTRIL) 5 MG tablet Take 2.5 mg by mouth daily.  . melatonin 5 MG TABS Take 5 mg by mouth at bedtime.  . metFORMIN (GLUCOPHAGE) 500 MG tablet Take 500 mg by mouth 2 (two) times daily with a meal.  . nitroGLYCERIN (NITROSTAT) 0.4 MG SL tablet Place 0.4 mg under the tongue every 5 (five) minutes as needed for chest pain.  . Nutritional Supplements (JUICE PLUS FIBRE PO) Take 8 capsules by mouth See admin instructions. Vegetable 4 capsule  and fruit 4  capsule daily  . tamsulosin (FLOMAX) 0.4 MG CAPS capsule Take 0.4 mg by mouth daily.  Marland Kitchen topiramate (TOPAMAX) 50 MG tablet Take 1 tablet (50 mg total) by mouth daily.  Tyler Aas FLEXTOUCH 100 UNIT/ML FlexTouch Pen Inject 12 Units into the skin at bedtime.  . triamcinolone cream (KENALOG) 0.1 % Apply 1 application topically 2 (two) times daily.  . [DISCONTINUED] isosorbide mononitrate (IMDUR) 30 MG 24 hr tablet Take 1 tablet (30 mg total) by mouth daily.     Allergies:   Penicillins, Milk protein, and Milk-related compounds   Social History   Socioeconomic History  . Marital status: Single    Spouse name: Not on file  . Number of children: Not on file  . Years of education: Not on file  . Highest education level: Not on file  Occupational History  . Not on file  Tobacco Use  . Smoking status: Current Every Day Smoker    Packs/day: 1.00  Years: 55.00    Pack years: 55.00  . Smokeless tobacco: Never Used  Vaping Use  . Vaping Use: Former  Substance and Sexual Activity  . Alcohol use: Not Currently  . Drug use: Never  . Sexual activity: Not on file  Other Topics Concern  . Not on file  Social History Narrative  . Not on file   Social Determinants of Health   Financial Resource Strain: Not on file  Food Insecurity: Not on file  Transportation Needs: Not on file  Physical Activity: Not on file  Stress: Not on file  Social Connections: Not on file     Family History: The patient's family history includes Breast cancer in his paternal grandmother; Diabetes in his father; Heart attack in his father and paternal grandfather; Heart disease in his father; Hyperlipidemia in his father; Hypertension in his father; Liver cancer in his maternal grandfather. ROS:   Please see the history of present illness.    All other systems reviewed and are negative.  EKGs/Labs/Other Studies Reviewed:    The following studies were reviewed today:  05/24/2020: Cholesterol 135 LDL 83  triglycerides 124 HDL 29 A1c 7.3% Recent Labs: 04/28/2020: BUN 22; Creatinine, Ser 1.32; Hemoglobin 14.1; NT-Pro BNP 327; Platelets 232; Potassium 4.5; Sodium 144  Recent Lipid Panel    Component Value Date/Time   CHOL 107 04/01/2018 1619   TRIG 116 04/01/2018 1619   HDL 32 (L) 04/01/2018 1619   CHOLHDL 3.3 04/01/2018 1619   LDLCALC 52 04/01/2018 1619    Physical Exam:    VS:  BP 90/70 (BP Location: Right Arm, Patient Position: Sitting, Cuff Size: Normal)   Pulse 88   Ht 6\' 4"  (1.93 m)   Wt 279 lb (126.6 kg)   SpO2 98%   BMI 33.96 kg/m     Wt Readings from Last 3 Encounters:  06/12/20 279 lb (126.6 kg)  05/05/20 285 lb (129.3 kg)  04/28/20 279 lb 9.6 oz (126.8 kg)     GEN:  Well nourished, well developed in no acute distress HEENT: Normal NECK: No JVD; No carotid bruits LYMPHATICS: No lymphadenopathy CARDIAC: RRR, no murmurs, rubs, gallops RESPIRATORY:  Clear to auscultation without rales, wheezing or rhonchi  ABDOMEN: Soft, non-tender, non-distended MUSCULOSKELETAL:  No edema; No deformity  SKIN: Warm and dry NEUROLOGIC:  Alert and oriented x 3 PSYCHIATRIC:  Normal affect    Signed, Shirlee More, MD  06/12/2020 10:18 AM    [place]

## 2020-06-12 ENCOUNTER — Other Ambulatory Visit: Payer: Self-pay

## 2020-06-12 ENCOUNTER — Encounter: Payer: Self-pay | Admitting: Cardiology

## 2020-06-12 ENCOUNTER — Ambulatory Visit (INDEPENDENT_AMBULATORY_CARE_PROVIDER_SITE_OTHER): Payer: Medicare HMO | Admitting: Cardiology

## 2020-06-12 VITALS — BP 90/70 | HR 88 | Ht 76.0 in | Wt 279.0 lb

## 2020-06-12 DIAGNOSIS — I255 Ischemic cardiomyopathy: Secondary | ICD-10-CM

## 2020-06-12 DIAGNOSIS — E7849 Other hyperlipidemia: Secondary | ICD-10-CM

## 2020-06-12 DIAGNOSIS — I119 Hypertensive heart disease without heart failure: Secondary | ICD-10-CM

## 2020-06-12 DIAGNOSIS — I2510 Atherosclerotic heart disease of native coronary artery without angina pectoris: Secondary | ICD-10-CM | POA: Diagnosis not present

## 2020-06-12 DIAGNOSIS — I350 Nonrheumatic aortic (valve) stenosis: Secondary | ICD-10-CM

## 2020-06-12 NOTE — Patient Instructions (Signed)
Medication Instructions:  Your physician has recommended you make the following change in your medication:  STOP: Imdur DECREASE: Lisinopril 2.5 mg take 0.5 tablet by mouth daily.  *If you need a refill on your cardiac medications before your next appointment, please call your pharmacy*   Lab Work: None If you have labs (blood work) drawn today and your tests are completely normal, you will receive your results only by: Marland Kitchen MyChart Message (if you have MyChart) OR . A paper copy in the mail If you have any lab test that is abnormal or we need to change your treatment, we will call you to review the results.   Testing/Procedures: NOne   Follow-Up: At [facility], you and your health needs are our priority.  As part of our continuing mission to provide you with exceptional heart care, we have created designated Provider Care Teams.  These Care Teams include your primary Cardiologist (physician) and Advanced Practice Providers (APPs -  Physician Assistants and Nurse Practitioners) who all work together to provide you with the care you need, when you need it.  We recommend signing up for the patient portal called "MyChart".  Sign up information is provided on this After Visit Summary.  MyChart is used to connect with patients for Virtual Visits (Telemedicine).  Patients are able to view lab/test results, encounter notes, upcoming appointments, etc.  Non-urgent messages can be sent to your provider as well.   To learn more about what you can do with MyChart, go to [email or web address].    Your next appointment:   6 month(s)  The format for your next appointment:   In Person  Provider:   Shirlee More, MD   Other Instructions

## 2020-06-20 ENCOUNTER — Ambulatory Visit (INDEPENDENT_AMBULATORY_CARE_PROVIDER_SITE_OTHER): Payer: Medicare HMO | Admitting: Sports Medicine

## 2020-06-20 ENCOUNTER — Other Ambulatory Visit: Payer: Self-pay

## 2020-06-20 ENCOUNTER — Encounter: Payer: Self-pay | Admitting: Sports Medicine

## 2020-06-20 DIAGNOSIS — M79675 Pain in left toe(s): Secondary | ICD-10-CM | POA: Diagnosis not present

## 2020-06-20 DIAGNOSIS — E1142 Type 2 diabetes mellitus with diabetic polyneuropathy: Secondary | ICD-10-CM

## 2020-06-20 DIAGNOSIS — M79674 Pain in right toe(s): Secondary | ICD-10-CM

## 2020-06-20 DIAGNOSIS — Z899 Acquired absence of limb, unspecified: Secondary | ICD-10-CM

## 2020-06-20 DIAGNOSIS — B351 Tinea unguium: Secondary | ICD-10-CM

## 2020-06-20 NOTE — Progress Notes (Signed)
Subjective: Craig Rollins is a 65 y.o. male patient with history of diabetes who presents to office today complaining of long,mildly painful nails  while ambulating in shoes; unable to trim. Patient states that the glucose reading is doing "good". Any changes with medical history or medications since last encounter. No other pedal complaints noted  Last PCP visit was 3 weeks ago, [name] FBS this am 96 A1C 7.4 .  Patient Active Problem List   Diagnosis Date Noted  . Chest pain of uncertain etiology   . Hypertensive heart disease 04/27/2020  . TIA (transient ischemic attack)   . Myocardial infarction ([place])   . High cholesterol   . Diabetes ([place])   . Cataract   . Brain aneurysm   . Hypercholesteremia 11/23/2019  . Unstable angina ([place]) 03/30/2019  . Chest discomfort 08/13/2018  . CHF (congestive heart failure) ([place]) 08/13/2018  . COPD (chronic obstructive pulmonary disease) ([place]) 08/13/2018  . Open wound 08/13/2018  . Shortness of breath 08/13/2018  . CAD in native artery 02/18/2018  . Aortic stenosis 02/18/2018  . Essential hypertension 02/18/2018  . Foot swelling 01/26/2018  . Diabetes mellitus, type 2 ([place]) 01/26/2018  . Diabetic peripheral neuropathy ([place]) 02/25/2017  . Hammer toes of both feet 02/25/2017  . Risk for falls 09/23/2016  . NSTEMI (non-ST elevated myocardial infarction) ([place]) 08/30/2016  . Wound eschar of foot 08/21/2016  . Dilated cardiomyopathy ([place]) 07/16/2016  . Dyslipidemia (high LDL; low HDL) 07/16/2016  . Noncompliance 07/16/2016  . Smoking 07/16/2016  . Stroke ([place]) 12/19/2015   Current Outpatient Medications on File Prior to Visit  Medication Sig Dispense Refill  . albuterol (VENTOLIN HFA) 108 (90 Base) MCG/ACT inhaler Inhale 2 puffs into the lungs every 6 (six) hours as needed for wheezing or shortness of breath.    Marland Kitchen aspirin EC 81 MG tablet Take 81 mg by mouth daily. Swallow whole.    Marland Kitchen atorvastatin (LIPITOR) 40 MG tablet Take 40 mg by  mouth daily.    . carvedilol (COREG) 3.125 MG tablet Take 6.25 mg by mouth every 12 (twelve) hours.    . clopidogrel (PLAVIX) 75 MG tablet Take 1 tablet (75 mg total) by mouth daily. 90 tablet 3  . diphenhydrAMINE (BENADRYL) 25 MG tablet Take 25 mg by mouth 2 (two) times daily.    . finasteride (PROSCAR) 5 MG tablet Take 5 mg by mouth daily.    . furosemide (LASIX) 40 MG tablet Take 40 mg by mouth daily as needed for fluid.    Marland Kitchen glipiZIDE (GLUCOTROL) 5 MG tablet Take 2.5 mg by mouth 2 (two) times daily before a meal.    . lisinopril (ZESTRIL) 5 MG tablet Take 2.5 mg by mouth daily.    . melatonin 5 MG TABS Take 5 mg by mouth at bedtime.    . metFORMIN (GLUCOPHAGE) 500 MG tablet Take 500 mg by mouth 2 (two) times daily with a meal.    . nitroGLYCERIN (NITROSTAT) 0.4 MG SL tablet Place 0.4 mg under the tongue every 5 (five) minutes as needed for chest pain.    . Nutritional Supplements (JUICE PLUS FIBRE PO) Take 8 capsules by mouth See admin instructions. Vegetable 4 capsule  and fruit 4 capsule daily    . tamsulosin (FLOMAX) 0.4 MG CAPS capsule Take 0.4 mg by mouth daily.    Marland Kitchen topiramate (TOPAMAX) 50 MG tablet Take 1 tablet (50 mg total) by mouth daily. 30 tablet 3  . TRESIBA FLEXTOUCH 100 UNIT/ML FlexTouch Pen  Inject 12 Units into the skin at bedtime.    . triamcinolone cream (KENALOG) 0.1 % Apply 1 application topically 2 (two) times daily.     No current facility-administered medications on file prior to visit.   Allergies  Allergen Reactions  . Penicillins Nausea And Vomiting    Reaction: Childhood  . Milk Protein Itching and Hives  . Milk-Related Compounds Hives and Itching    Recent Results (from the past 2160 hour(s))  Basic metabolic panel     Status: Abnormal   Collection Time: 04/28/20 11:22 AM  Result Value Ref Range   Glucose 142 (H) 65 - 99 mg/dL   BUN 22 8 - 27 mg/dL   Creatinine, Ser 1.32 (H) 0.76 - 1.27 mg/dL   GFR calc non Af Amer 57 (L) >59 mL/min/1.73   GFR calc  Af Amer 66 >59 mL/min/1.73    Comment: **In accordance with recommendations from the NKF-ASN Task force,**   [facility] is in the process of updating its eGFR calculation to the   2021 CKD-EPI creatinine equation that estimates kidney function   without a race variable.    BUN/Creatinine Ratio 17 10 - 24   Sodium 144 134 - 144 mmol/L   Potassium 4.5 3.5 - 5.2 mmol/L   Chloride 107 (H) 96 - 106 mmol/L   CO2 25 20 - 29 mmol/L   Calcium 9.5 8.6 - 10.2 mg/dL  CBC     Status: None   Collection Time: 04/28/20 11:22 AM  Result Value Ref Range   WBC 9.3 3.4 - 10.8 x10E3/uL   RBC 4.81 4.14 - 5.80 x10E6/uL   Hemoglobin 14.1 13.0 - 17.7 g/dL   Hematocrit 44.0 37.5 - 51.0 %   MCV 92 79 - 97 fL   MCH 29.3 26.6 - 33.0 pg   MCHC 32.0 31.5 - 35.7 g/dL   RDW 13.7 11.6 - 15.4 %   Platelets 232 150 - 450 x10E3/uL  Pro b natriuretic peptide (BNP)     Status: Abnormal   Collection Time: 04/28/20 11:22 AM  Result Value Ref Range   NT-Pro BNP 327 (H) 0 - 210 pg/mL    Comment: The following cut-points have been suggested for the use of proBNP for the diagnostic evaluation of heart failure (HF) in patients with acute dyspnea: Modality                     Age           Optimal Cut                            (years)            Point ------------------------------------------------------ Diagnosis (rule in HF)        <50            450 pg/mL                           50 - 75            900 pg/mL                               >75           1800 pg/mL Exclusion (rule out HF)  Age independent     300 pg/mL   SARS CORONAVIRUS 2 (TAT  6-24 HRS) Nasopharyngeal Nasopharyngeal Swab     Status: None   Collection Time: 05/03/20 11:12 AM   Specimen: Nasopharyngeal Swab  Result Value Ref Range   SARS Coronavirus 2 NEGATIVE NEGATIVE    Comment: (NOTE) SARS-CoV-2 target nucleic acids are NOT DETECTED.  The SARS-CoV-2 RNA is generally detectable in upper and lower respiratory specimens during the acute phase of  infection. Negative results do not preclude SARS-CoV-2 infection, do not rule out co-infections with other pathogens, and should not be used as the sole basis for treatment or other patient management decisions. Negative results must be combined with clinical observations, patient history, and epidemiological information. The expected result is Negative.  Fact Sheet for Patients: SugarRoll.be  Fact Sheet for Healthcare Providers: https://www.woods-mathews.com/  This test is not yet approved or cleared by the [place] FDA and  has been authorized for detection and/or diagnosis of SARS-CoV-2 by FDA under an Emergency Use Authorization (EUA). This EUA will remain  in effect (meaning this test can be used) for the duration of the COVID-19 declaration under Se ction 564(b)(1) of the Act, 21 U.S.C. section 360bbb-3(b)(1), unless the authorization is terminated or revoked sooner.  Performed at [facility], [place] [address] [place], [place] [address]   Glucose, capillary     Status: Abnormal   Collection Time: 05/05/20  5:44 AM  Result Value Ref Range   Glucose-Capillary 220 (H) 70 - 99 mg/dL    Comment: Glucose reference range applies only to samples taken after fasting for at least 8 hours.    Objective: General: Patient is awake, alert, and oriented x 3 and in no acute distress.  Integument: Skin is warm, dry and supple bilateral. Nails are tender, long, thickened and dystrophic with subungual debris, consistent with onychomycosis, 1 through 5 on the left and 1-3 and 5 on right, no signs of infection.  Minimal reactive keratosis plantar right foot.  Previous left foot ulcer remains healed.  Remaining integument unremarkable.  Vasculature:  Dorsalis Pedis pulse 1/4 bilateral. Posterior Tibial pulse  0/4 bilateral. Capillary fill time <5 sec in all remaining digits.  Trophic skin just.  No varicosities present bilateral. No edema  present bilateral.   Neurology: Protective sensation absent bilateral with Semmes Weinstein monofilament  Musculoskeletal: No pain to palpation.  Patient is status post right fourth toe amputation remains healed.  Assessment and Plan: Problem List Items Addressed This Visit      Endocrine   Diabetic peripheral neuropathy ([place])    Other Visit Diagnoses    Pain due to onychomycosis of toenails of both feet    -  Primary   History of amputation          -Examined patient. -Discussed and educated patient on diabetic foot care, -Mechanically debrided all nails x9  bilateral using sterile nail nipper and filed with dremel without incident  -Continue with daily skin emollients -Continue with diabetic shoes -Answered all patient questions -Patient to return  in 3 months for at risk foot care -Patient advised to call the office if any problems or questions arise in the meantime.  [name], DPM

## 2020-09-19 ENCOUNTER — Encounter: Payer: Self-pay | Admitting: Sports Medicine

## 2020-09-19 ENCOUNTER — Other Ambulatory Visit: Payer: Self-pay

## 2020-09-19 ENCOUNTER — Ambulatory Visit (INDEPENDENT_AMBULATORY_CARE_PROVIDER_SITE_OTHER): Payer: Medicare HMO | Admitting: Sports Medicine

## 2020-09-19 DIAGNOSIS — E1142 Type 2 diabetes mellitus with diabetic polyneuropathy: Secondary | ICD-10-CM

## 2020-09-19 DIAGNOSIS — M79675 Pain in left toe(s): Secondary | ICD-10-CM

## 2020-09-19 DIAGNOSIS — Z899 Acquired absence of limb, unspecified: Secondary | ICD-10-CM | POA: Diagnosis not present

## 2020-09-19 DIAGNOSIS — B351 Tinea unguium: Secondary | ICD-10-CM

## 2020-09-19 DIAGNOSIS — M79674 Pain in right toe(s): Secondary | ICD-10-CM

## 2020-09-19 DIAGNOSIS — L853 Xerosis cutis: Secondary | ICD-10-CM

## 2020-09-19 NOTE — Progress Notes (Signed)
Subjective: [name] is a 65 y.o. male patient with history of diabetes who presents to office today complaining of long,mildly painful nails  while ambulating in shoes; unable to trim.  Patient denies any other pedal complaints since last encounter.  Denies any changes with medical history since last encounter.  Last PCP visit was 3 to 4 weeks ago, Craig Rollins FBS this am 117 A1C 7.2 .  Patient Active Problem List   Diagnosis Date Noted  . Chest pain of uncertain etiology   . Hypertensive heart disease 04/27/2020  . TIA (transient ischemic attack)   . Myocardial infarction ([place])   . High cholesterol   . Diabetes ([place])   . Cataract   . Brain aneurysm   . Hypercholesteremia 11/23/2019  . Unstable angina ([place]) 03/30/2019  . Chest discomfort 08/13/2018  . CHF (congestive heart failure) ([place]) 08/13/2018  . COPD (chronic obstructive pulmonary disease) ([place]) 08/13/2018  . Open wound 08/13/2018  . Shortness of breath 08/13/2018  . CAD in native artery 02/18/2018  . Aortic stenosis 02/18/2018  . Essential hypertension 02/18/2018  . Foot swelling 01/26/2018  . Diabetes mellitus, type 2 ([place]) 01/26/2018  . Diabetic peripheral neuropathy ([place]) 02/25/2017  . Hammer toes of both feet 02/25/2017  . Risk for falls 09/23/2016  . NSTEMI (non-ST elevated myocardial infarction) ([place]) 08/30/2016  . Wound eschar of foot 08/21/2016  . Dilated cardiomyopathy ([place]) 07/16/2016  . Dyslipidemia (high LDL; low HDL) 07/16/2016  . Noncompliance 07/16/2016  . Smoking 07/16/2016  . Stroke (Craig Rollins) 12/19/2015   Current Outpatient Medications on File Prior to Visit  Medication Sig Dispense Refill  . albuterol (VENTOLIN HFA) 108 (90 Base) MCG/ACT inhaler Inhale 2 puffs into the lungs every 6 (six) hours as needed for wheezing or shortness of breath.    Marland Kitchen aspirin EC 81 MG tablet Take 81 mg by mouth daily. Swallow whole.    Marland Kitchen atorvastatin (LIPITOR) 40 MG tablet Take 40 mg by mouth daily.    .  carvedilol (COREG) 3.125 MG tablet Take 6.25 mg by mouth every 12 (twelve) hours.    . clopidogrel (PLAVIX) 75 MG tablet Take 1 tablet (75 mg total) by mouth daily. 90 tablet 3  . diphenhydrAMINE (BENADRYL) 25 MG tablet Take 25 mg by mouth 2 (two) times daily.    . finasteride (PROSCAR) 5 MG tablet Take 5 mg by mouth daily.    . furosemide (LASIX) 40 MG tablet Take 40 mg by mouth daily as needed for fluid.    Marland Kitchen glipiZIDE (GLUCOTROL) 5 MG tablet Take 2.5 mg by mouth 2 (two) times daily before a meal.    . lisinopril (ZESTRIL) 5 MG tablet Take 2.5 mg by mouth daily.    . melatonin 5 MG TABS Take 5 mg by mouth at bedtime.    . metFORMIN (GLUCOPHAGE) 500 MG tablet Take 500 mg by mouth 2 (two) times daily with a meal.    . nitroGLYCERIN (NITROSTAT) 0.4 MG SL tablet Place 0.4 mg under the tongue every 5 (five) minutes as needed for chest pain.    . Nutritional Supplements (JUICE PLUS FIBRE PO) Take 8 capsules by mouth See admin instructions. Vegetable 4 capsule  and fruit 4 capsule daily    . tamsulosin (FLOMAX) 0.4 MG CAPS capsule Take 0.4 mg by mouth daily.    Marland Kitchen topiramate (TOPAMAX) 50 MG tablet Take 1 tablet (50 mg total) by mouth daily. 30 tablet 3  . TRESIBA FLEXTOUCH 100 UNIT/ML FlexTouch Pen Inject 12  Units into the skin at bedtime.    . triamcinolone cream (KENALOG) 0.1 % Apply 1 application topically 2 (two) times daily.     No current facility-administered medications on file prior to visit.   Allergies  Allergen Reactions  . Penicillins Nausea And Vomiting    Reaction: Childhood  . Milk Protein Itching and Hives  . Milk-Related Compounds Hives and Itching    No results found for this or any previous visit (from the past 2160 hour(s)).  Objective: General: Patient is awake, alert, and oriented x 3 and in no acute distress.  Integument: Skin is warm, dry and supple bilateral. Nails are tender, long, thickened and dystrophic with subungual debris, consistent with onychomycosis, 1  through 5 on the left and 1-3 and 5 on right, no signs of infection.  Minimal reactive keratosis plantar right foot.  Remaining integument unremarkable.  Vasculature:  Dorsalis Pedis pulse 1/4 bilateral. Posterior Tibial pulse  0/4 bilateral. Capillary fill time <5 sec in all remaining digits.  Trophic skin just.  No varicosities present bilateral. No edema present bilateral.   Neurology: Protective sensation absent bilateral with Semmes Weinstein monofilament  Musculoskeletal: No pain to palpation.  Patient is status post right fourth toe amputation remains healed.  Assessment and Plan: Problem List Items Addressed This Visit      Endocrine   Diabetic peripheral neuropathy ([place])    Other Visit Diagnoses    Pain due to onychomycosis of toenails of both feet    -  Primary   History of amputation       Xerosis cutis          -Examined patient. -Re-Discussed and educated patient on diabetic foot care, -Mechanically debrided all nails x9  bilateral using sterile nail nipper and filed with dremel without incident  -Continue with daily skin emollients, foot miracle cream -Continue with diabetic shoes -Answered all patient questions -Patient to return  in 3 months for at risk foot care -Patient advised to call the office if any problems or questions arise in the meantime.  [name], DPM

## 2020-12-08 ENCOUNTER — Other Ambulatory Visit: Payer: Self-pay | Admitting: Cardiology

## 2020-12-20 ENCOUNTER — Other Ambulatory Visit: Payer: Self-pay

## 2020-12-20 ENCOUNTER — Encounter: Payer: Self-pay | Admitting: Sports Medicine

## 2020-12-20 ENCOUNTER — Ambulatory Visit (INDEPENDENT_AMBULATORY_CARE_PROVIDER_SITE_OTHER): Payer: Medicare HMO | Admitting: Sports Medicine

## 2020-12-20 DIAGNOSIS — E1142 Type 2 diabetes mellitus with diabetic polyneuropathy: Secondary | ICD-10-CM | POA: Diagnosis not present

## 2020-12-20 DIAGNOSIS — M79674 Pain in right toe(s): Secondary | ICD-10-CM

## 2020-12-20 DIAGNOSIS — B351 Tinea unguium: Secondary | ICD-10-CM

## 2020-12-20 DIAGNOSIS — Z899 Acquired absence of limb, unspecified: Secondary | ICD-10-CM

## 2020-12-20 DIAGNOSIS — L853 Xerosis cutis: Secondary | ICD-10-CM

## 2020-12-20 DIAGNOSIS — M79675 Pain in left toe(s): Secondary | ICD-10-CM

## 2020-12-20 NOTE — Progress Notes (Signed)
Subjective: Craig Rollins is a 65 y.o. male patient with history of diabetes who presents to office today complaining of long,mildly painful nails  while ambulating in shoes; unable to trim.  Patient denies any other pedal complaints since last encounter.  Denies any changes with medical history since last encounter.  Last PCP visit was 3 weeks ago, [name] FBS this am 129 A1C 7.2 .  Patient Active Problem List   Diagnosis Date Noted   Chest pain of uncertain etiology    Hypertensive heart disease 04/27/2020   TIA (transient ischemic attack)    Myocardial infarction ([place])    High cholesterol    Diabetes (Craig Rollins)    Cataract    Brain aneurysm    Hypercholesteremia 11/23/2019   Unstable angina (Craig Rollins) 03/30/2019   Chest discomfort 08/13/2018   CHF (congestive heart failure) (Craig Rollins) 08/13/2018   COPD (chronic obstructive pulmonary disease) ([place]) 08/13/2018   Open wound 08/13/2018   Shortness of breath 08/13/2018   CAD in native artery 02/18/2018   Aortic stenosis 02/18/2018   Essential hypertension 02/18/2018   Foot swelling 01/26/2018   Diabetes mellitus, type 2 (Craig Rollins) 01/26/2018   Diabetic peripheral neuropathy (Craig Rollins) 02/25/2017   Hammer toes of both feet 02/25/2017   Risk for falls 09/23/2016   NSTEMI (non-ST elevated myocardial infarction) (Craig Rollins) 08/30/2016   Wound eschar of foot 08/21/2016   Dilated cardiomyopathy (Craig Rollins) 07/16/2016   Dyslipidemia (high LDL; low HDL) 07/16/2016   Noncompliance 07/16/2016   Smoking 07/16/2016   Stroke (Craig Rollins) 12/19/2015   Current Outpatient Medications on File Prior to Visit  Medication Sig Dispense Refill   albuterol (VENTOLIN HFA) 108 (90 Base) MCG/ACT inhaler Inhale 2 puffs into the lungs every 6 (six) hours as needed for wheezing or shortness of breath.     aspirin EC 81 MG tablet Take 81 mg by mouth daily. Swallow whole.     atorvastatin (LIPITOR) 40 MG tablet Take 40 mg by mouth daily.     carvedilol (COREG) 3.125 MG tablet Take 6.25  mg by mouth every 12 (twelve) hours.     clopidogrel (PLAVIX) 75 MG tablet TAKE 1 TABLET BY MOUTH DAILY 90 tablet 2   diphenhydrAMINE (BENADRYL) 25 MG tablet Take 25 mg by mouth 2 (two) times daily.     finasteride (PROSCAR) 5 MG tablet Take 5 mg by mouth daily.     furosemide (LASIX) 40 MG tablet Take 40 mg by mouth daily as needed for fluid.     glipiZIDE (GLUCOTROL) 5 MG tablet Take 2.5 mg by mouth 2 (two) times daily before a meal.     lisinopril (ZESTRIL) 5 MG tablet Take 2.5 mg by mouth daily.     melatonin 5 MG TABS Take 5 mg by mouth at bedtime.     metFORMIN (GLUCOPHAGE) 500 MG tablet Take 500 mg by mouth 2 (two) times daily with a meal.     nitroGLYCERIN (NITROSTAT) 0.4 MG SL tablet Place 0.4 mg under the tongue every 5 (five) minutes as needed for chest pain.     Nutritional Supplements (JUICE PLUS FIBRE PO) Take 8 capsules by mouth See admin instructions. Vegetable 4 capsule  and fruit 4 capsule daily     tamsulosin (FLOMAX) 0.4 MG CAPS capsule Take 0.4 mg by mouth daily.     topiramate (TOPAMAX) 50 MG tablet Take 1 tablet (50 mg total) by mouth daily. 30 tablet 3   TRESIBA FLEXTOUCH 100 UNIT/ML FlexTouch Pen Inject 12 Units into the skin at  bedtime.     triamcinolone cream (KENALOG) 0.1 % Apply 1 application topically 2 (two) times daily.     No current facility-administered medications on file prior to visit.   Allergies  Allergen Reactions   Penicillins Nausea And Vomiting    Reaction: Childhood   Milk Protein Itching and Hives   Milk-Related Compounds Hives and Itching    No results found for this or any previous visit (from the past 2160 hour(s)).  Objective: General: Patient is awake, alert, and oriented x 3 and in no acute distress.  Integument: Skin is warm, dry and supple bilateral. Nails are tender, long, thickened and dystrophic with subungual debris, consistent with onychomycosis, 1 through 5 on the left and 1-3 and 5 on right, no signs of infection.  Minimal  reactive keratosis plantar right foot.  Remaining integument unremarkable.  Vasculature:  Dorsalis Pedis pulse 1/4 bilateral. Posterior Tibial pulse  0/4 bilateral. Capillary fill time <5 sec in all remaining digits.  Trophic skin just.  No varicosities present bilateral. No edema present bilateral.   Neurology: Protective sensation absent bilateral with Semmes Weinstein monofilament  Musculoskeletal: No pain to palpation.  Patient is status post right fourth toe amputation remains healed.  Assessment and Plan: Problem List Items Addressed This Visit       Endocrine   Diabetic peripheral neuropathy (Craig Rollins)   Other Visit Diagnoses     Pain due to onychomycosis of toenails of both feet    -  Primary   History of amputation       Xerosis cutis           -Examined patient. -Re-Discussed and educated patient on diabetic foot care, -Mechanically debrided all nails x9  bilateral using sterile nail nipper and filed with dremel without incident  -Mechanically debrided reactive keratosis plantar right foot using a sterile 15 blade without incident -Continue with daily skin emollients, encouraged continued use of foot miracle cream -Continue with diabetic shoes -Answered all patient questions -Patient to return  in 3 months for at risk foot care -Patient advised to call the office if any problems or questions arise in the meantime.  Craig Rollins, DPM

## 2020-12-24 NOTE — Progress Notes (Signed)
Cardiology Office Note:    Date:  12/25/2020   ID:  [name], DOB 03-28-1956, MRN [id number]  PCP:  [name], NP  Cardiologist:  Shirlee More, MD    Referring MD: [name], NP    ASSESSMENT:    1. CAD in native artery   2. Ischemic cardiomyopathy   3. Hypertensive heart disease without heart failure   4. Familial hyperlipidemia   5. Aortic valve stenosis, etiology of cardiac valve disease unspecified   6. Chronic obstructive pulmonary disease, unspecified COPD type ([place])    PLAN:    In order of problems listed above:  Stable CAD no anginal discomfort continue medical treatment including aspirin high intensity statin beta-blocker Optimize treatment add SGLT2 inhibitor and if hemodynamics remain stable at next visit transition ACE to ARB.  We will also increase his loop diuretic to daily Continue current lipid treatment ideal LDL Stable mild aortic stenosis and cath Stable COPD continue his bronchodilators   Next appointment: 6 months   Medication Adjustments/Labs and Tests Ordered: Current medicines are reviewed at length with the patient today.  Concerns regarding medicines are outlined above.  No orders of the defined types were placed in this encounter.  No orders of the defined types were placed in this encounter.   Chief Complaint  Patient presents with   Follow-up   Coronary Artery Disease   Cardiomyopathy    History of Present Illness:    Craig Rollins is a 65 y.o. male with a hx of CAD with PCI and stent right coronary artery 04/08/2019 hypertensive heart disease without heart failure familial hyperlipidemia and ischemic cardiomyopathy with with mild aortic stenosis and LV dysfunction EF in the range of 40% peripheral arterial disease and previous pulmonary embolism.  He was then admitted to [facility] December 2021 and discharged the next day.  Myocardial perfusion study showed an EF of 36% and inferior lateral scar and  hypokinesia fixed defect scar no ischemia. He underwent left heart catheterization 05/05/2020 showing patent stents in the LAD and right coronary artery mild to moderate mid LAD stenosis not flow-limiting  He was advised ongoing medical treatment and oral mononitrate was added to his regimen.   Coronary Diagrams Diagnostic Dominance: Right          03/20/2020 ejection fraction 40 to 45% with global hypokinesia and moderate aortic stenosis.  During catheterization he had a 24 mm peak gradient LV to aorta.   He was last seen 06/12/2020 doing well on reduced dose beta-blocker and ACE inhibitor due to hypotension.  Compliance with diet, lifestyle and medications: Yes  Is done well since his hospitalization he takes a diuretic perhaps every other day he notices more edema we will take it daily. He is not having any hypotensive blood pressures and typically runs 120-130/70-80 at home. Is an opportunity to optimize treatment we will add SGLT2 inhibitor and if tolerated next visit consider transition ACE to [facility]. He has had no anginal discomfort shortness of breath palpitation or syncope. Recent labs look quite good except for A1c still above target at 7.4% Cholesterol 124 LDL 72 triglycerides 99 HDL 33 A1c 7.4% hemoglobin 13.6 creatinine 1.7 potassium 4.7  He was seen at [facility] ED 10/03/2020 with complaints of COVID-like symptoms weakness temperature 102.0 pulse rate 124 blood pressure 125/93 oxygen sat 99% respiratory rate mildly tachypneic 18/min white count was markedly elevated at 21,600 hemoglobin 13.2 BMP normal except for sodium mildly diminished 135.  His EKG showed  sinus tachycardia with frequent PVCs and anterior MI.  Chest x-ray was felt to be normal CT scan chest abdomen and pelvis showed coronary artery calcification and aortic atherosclerosis no pulmonary infiltrate.  He was discharged 5 19 2022 with a discharge diagnosis acute exacerbation of COPD and chronic combined  systolic and diastolic heart failure.  His COVID and flu testing were normal.  He was treated with Solu-Medrol and add oral doxycycline.  Troponin was undetectable he did not have a proBNP level performed there is no discussion of discharge summary regarding congestive heart failure.  Past Medical History:  Diagnosis Date   Aortic stenosis 02/18/2018   Brain aneurysm    CAD in native artery 02/18/2018   Cataract    Chest discomfort 08/13/2018   CHF (congestive heart failure) ([facility])    COPD (chronic obstructive pulmonary disease) ([place])    Diabetes ([place])    Diabetes mellitus, type 2 ([place]) 01/26/2018   Diabetic peripheral neuropathy ([place]) 02/25/2017   Dilated cardiomyopathy ([place]) 07/16/2016   Ejection fraction 30% based on echo from 2018   Dyslipidemia (high LDL; low HDL) 07/16/2016   Essential hypertension 02/18/2018   Foot swelling 01/26/2018   Hammer toes of both feet 02/25/2017   High cholesterol    Hypercholesteremia 11/23/2019   Myocardial infarction ([place])    Noncompliance 07/16/2016   NSTEMI (non-ST elevated myocardial infarction) ([place]) 08/30/2016   Shortness of breath 08/13/2018   Smoking 07/16/2016   Stroke ([place])    TIA (transient ischemic attack)    Unstable angina ([place]) 03/30/2019   Wound eschar of foot 08/21/2016    Past Surgical History:  Procedure Laterality Date   CORONARY ANGIOPLASTY WITH STENT PLACEMENT     KNEE SURGERY     LEFT HEART CATH AND CORONARY ANGIOGRAPHY N/A 05/05/2020   Procedure: LEFT HEART CATH AND CORONARY ANGIOGRAPHY;  Surgeon: Burnell Blanks, MD;  Location: [facility];  Service: Cardiovascular;  Laterality: N/A;    Current Medications: Current Meds  Medication Sig   albuterol (VENTOLIN HFA) 108 (90 Base) MCG/ACT inhaler Inhale 2 puffs into the lungs every 6 (six) hours as needed for wheezing or shortness of breath.   aspirin EC 81 MG tablet Take 81 mg by mouth daily. Swallow whole.   atorvastatin (LIPITOR) 40 MG tablet Take 40 mg by mouth daily.    carvedilol (COREG) 3.125 MG tablet Take 6.25 mg by mouth every 12 (twelve) hours.   clopidogrel (PLAVIX) 75 MG tablet TAKE 1 TABLET BY MOUTH DAILY   diphenhydrAMINE (BENADRYL) 25 MG tablet Take 25 mg by mouth 2 (two) times daily.   finasteride (PROSCAR) 5 MG tablet Take 5 mg by mouth daily.   furosemide (LASIX) 40 MG tablet Take 40 mg by mouth daily as needed for fluid.   glipiZIDE (GLUCOTROL) 5 MG tablet Take 2.5 mg by mouth 2 (two) times daily before a meal.   lisinopril (ZESTRIL) 5 MG tablet Take 2.5 mg by mouth daily.   metFORMIN (GLUCOPHAGE) 500 MG tablet Take 500 mg by mouth 2 (two) times daily with a meal.   nitroGLYCERIN (NITROSTAT) 0.4 MG SL tablet Place 0.4 mg under the tongue every 5 (five) minutes as needed for chest pain.   Nutritional Supplements (JUICE PLUS FIBRE PO) Take 8 capsules by mouth See admin instructions. Vegetable 4 capsule  and fruit 4 capsule daily   tamsulosin (FLOMAX) 0.4 MG CAPS capsule Take 0.4 mg by mouth daily.   topiramate (TOPAMAX) 50 MG tablet Take 1 tablet (50  mg total) by mouth daily.   TRESIBA FLEXTOUCH 100 UNIT/ML FlexTouch Pen Inject 12 Units into the skin at bedtime.   triamcinolone cream (KENALOG) 0.1 % Apply 1 application topically 2 (two) times daily.     Allergies:   Penicillins, Milk protein, and Milk-related compounds   Social History   Socioeconomic History   Marital status: Single    Spouse name: Not on file   Number of children: Not on file   Years of education: Not on file   Highest education level: Not on file  Occupational History   Not on file  Tobacco Use   Smoking status: Every Day    Packs/day: 1.00    Years: 55.00    Pack years: 55.00    Types: Cigarettes   Smokeless tobacco: Never  Vaping Use   Vaping Use: Former  Substance and Sexual Activity   Alcohol use: Not Currently   Drug use: Never   Sexual activity: Not on file  Other Topics Concern   Not on file  Social History Narrative   Not on file   Social  Determinants of Health   Financial Resource Strain: Not on file  Food Insecurity: Not on file  Transportation Needs: Not on file  Physical Activity: Not on file  Stress: Not on file  Social Connections: Not on file     Family History: The patient's family history includes Breast cancer in his paternal grandmother; Diabetes in his father; Heart attack in his father and paternal grandfather; Heart disease in his father; Hyperlipidemia in his father; Hypertension in his father; Liver cancer in his maternal grandfather. ROS:   Please see the history of present illness.    All other systems reviewed and are negative.  EKGs/Labs/Other Studies Reviewed:    The following studies were reviewed today:    Recent Labs: 04/28/2020: BUN 22; Creatinine, Ser 1.32; Hemoglobin 14.1; NT-Pro BNP 327; Platelets 232; Potassium 4.5; Sodium 144  Recent Lipid Panel    Component Value Date/Time   CHOL 107 04/01/2018 1619   TRIG 116 04/01/2018 1619   HDL 32 (L) 04/01/2018 1619   CHOLHDL 3.3 04/01/2018 1619   LDLCALC 52 04/01/2018 1619    Physical Exam:    VS:  BP 131/79   Pulse 70   Ht '6\' 4"'$  (1.93 m)   Wt 296 lb 12.8 oz (134.6 kg)   SpO2 99%   BMI 36.13 kg/m     Wt Readings from Last 3 Encounters:  12/25/20 296 lb 12.8 oz (134.6 kg)  06/12/20 279 lb (126.6 kg)  05/05/20 285 lb (129.3 kg)     GEN:  Well nourished, well developed in no acute distress HEENT: Normal NECK: No JVD; No carotid bruits LYMPHATICS: No lymphadenopathy CARDIAC: RRR, no murmurs, rubs, gallops RESPIRATORY:  Clear to auscultation without rales, wheezing or rhonchi  ABDOMEN: Soft, non-tender, non-distended MUSCULOSKELETAL: 2+ bilateral lower extremity pitting edema; No deformity  SKIN: Warm and dry NEUROLOGIC:  Alert and oriented x 3 PSYCHIATRIC:  Normal affect    Signed, Shirlee More, MD  12/25/2020 2:41 PM    [place]

## 2020-12-25 ENCOUNTER — Other Ambulatory Visit: Payer: Self-pay

## 2020-12-25 ENCOUNTER — Ambulatory Visit (INDEPENDENT_AMBULATORY_CARE_PROVIDER_SITE_OTHER): Payer: Medicare HMO | Admitting: Cardiology

## 2020-12-25 ENCOUNTER — Encounter: Payer: Self-pay | Admitting: Cardiology

## 2020-12-25 VITALS — BP 131/79 | HR 70 | Ht 76.0 in | Wt 296.8 lb

## 2020-12-25 DIAGNOSIS — I255 Ischemic cardiomyopathy: Secondary | ICD-10-CM | POA: Diagnosis not present

## 2020-12-25 DIAGNOSIS — I5042 Chronic combined systolic (congestive) and diastolic (congestive) heart failure: Secondary | ICD-10-CM

## 2020-12-25 DIAGNOSIS — E7849 Other hyperlipidemia: Secondary | ICD-10-CM

## 2020-12-25 DIAGNOSIS — I2510 Atherosclerotic heart disease of native coronary artery without angina pectoris: Secondary | ICD-10-CM

## 2020-12-25 DIAGNOSIS — I110 Hypertensive heart disease with heart failure: Secondary | ICD-10-CM

## 2020-12-25 DIAGNOSIS — I350 Nonrheumatic aortic (valve) stenosis: Secondary | ICD-10-CM

## 2020-12-25 DIAGNOSIS — J449 Chronic obstructive pulmonary disease, unspecified: Secondary | ICD-10-CM

## 2020-12-25 MED ORDER — DAPAGLIFLOZIN PROPANEDIOL 10 MG PO TABS: 10.0000 mg | ORAL_TABLET | Freq: Every day | ORAL | 3 refills | Status: AC

## 2020-12-25 MED ORDER — VARENICLINE TARTRATE 0.5 MG X 11 & 1 MG X 42 PO MISC: ORAL | 0 refills | Status: DC

## 2020-12-25 NOTE — Patient Instructions (Signed)
Medication Instructions:  Your physician has recommended you make the following change in your medication:  START: Farxiga 10 mg take one tablet by mouth daily.  INCREASE: Furosemide take one tablet by mouth daily.  START: Chantix starter pack was called in today. Please follow their directions and call us for a refill if this works for you. *If you need a refill on your cardiac medications before your next appointment, please call your pharmacy*   Lab Work: None If you have labs (blood work) drawn today and your tests are completely normal, you will receive your results only by: [place] (if you have MyChart) OR A paper copy in the mail If you have any lab test that is abnormal or we need to change your treatment, we will call you to review the results.   Testing/Procedures: None   Follow-Up: At [facility], you and your health needs are our priority.  As part of our continuing mission to provide you with exceptional heart care, we have created designated Provider Care Teams.  These Care Teams include your primary Cardiologist (physician) and Advanced Practice Providers (APPs -  Physician Assistants and Nurse Practitioners) who all work together to provide you with the care you need, when you need it.  We recommend signing up for the patient portal called "MyChart".  Sign up information is provided on this After Visit Summary.  MyChart is used to connect with patients for Virtual Visits (Telemedicine).  Patients are able to view lab/test results, encounter notes, upcoming appointments, etc.  Non-urgent messages can be sent to your provider as well.   To learn more about what you can do with MyChart, go to [email or web address].    Your next appointment:   6 month(s)  The format for your next appointment:   In Person  Provider:   Dr. Bettina Gavia   Other Instructions

## 2021-01-19 ENCOUNTER — Inpatient Hospital Stay (HOSPITAL_COMMUNITY): Payer: Medicare HMO

## 2021-01-19 ENCOUNTER — Inpatient Hospital Stay (HOSPITAL_COMMUNITY)
Admission: AD | Admit: 2021-01-19 | Discharge: 2021-01-20 | DRG: 069 | Disposition: A | Discharge status: Home Health | Payer: Medicare HMO | Source: Other Acute Inpatient Hospital | Attending: Neurology | Admitting: Neurology

## 2021-01-19 DIAGNOSIS — E1122 Type 2 diabetes mellitus with diabetic chronic kidney disease: Secondary | ICD-10-CM | POA: Diagnosis present

## 2021-01-19 DIAGNOSIS — Z803 Family history of malignant neoplasm of breast: Secondary | ICD-10-CM

## 2021-01-19 DIAGNOSIS — I129 Hypertensive chronic kidney disease with stage 1 through stage 4 chronic kidney disease, or unspecified chronic kidney disease: Secondary | ICD-10-CM | POA: Diagnosis present

## 2021-01-19 DIAGNOSIS — E7800 Pure hypercholesterolemia, unspecified: Secondary | ICD-10-CM | POA: Diagnosis present

## 2021-01-19 DIAGNOSIS — N182 Chronic kidney disease, stage 2 (mild): Secondary | ICD-10-CM | POA: Diagnosis present

## 2021-01-19 DIAGNOSIS — Z9282 Status post administration of tPA (rtPA) in a different facility within the last 24 hours prior to admission to current facility: Secondary | ICD-10-CM | POA: Diagnosis not present

## 2021-01-19 DIAGNOSIS — Z8249 Family history of ischemic heart disease and other diseases of the circulatory system: Secondary | ICD-10-CM | POA: Diagnosis not present

## 2021-01-19 DIAGNOSIS — J449 Chronic obstructive pulmonary disease, unspecified: Secondary | ICD-10-CM | POA: Diagnosis present

## 2021-01-19 DIAGNOSIS — E785 Hyperlipidemia, unspecified: Secondary | ICD-10-CM

## 2021-01-19 DIAGNOSIS — I639 Cerebral infarction, unspecified: Secondary | ICD-10-CM | POA: Diagnosis not present

## 2021-01-19 DIAGNOSIS — I252 Old myocardial infarction: Secondary | ICD-10-CM | POA: Diagnosis not present

## 2021-01-19 DIAGNOSIS — R260 Ataxic gait: Secondary | ICD-10-CM | POA: Diagnosis present

## 2021-01-19 DIAGNOSIS — F17200 Nicotine dependence, unspecified, uncomplicated: Secondary | ICD-10-CM | POA: Diagnosis not present

## 2021-01-19 DIAGNOSIS — Z79899 Other long term (current) drug therapy: Secondary | ICD-10-CM

## 2021-01-19 DIAGNOSIS — Z83438 Family history of other disorder of lipoprotein metabolism and other lipidemia: Secondary | ICD-10-CM | POA: Diagnosis not present

## 2021-01-19 DIAGNOSIS — E1169 Type 2 diabetes mellitus with other specified complication: Secondary | ICD-10-CM

## 2021-01-19 DIAGNOSIS — I6389 Other cerebral infarction: Secondary | ICD-10-CM | POA: Diagnosis not present

## 2021-01-19 DIAGNOSIS — E669 Obesity, unspecified: Secondary | ICD-10-CM | POA: Diagnosis present

## 2021-01-19 DIAGNOSIS — G459 Transient cerebral ischemic attack, unspecified: Secondary | ICD-10-CM

## 2021-01-19 DIAGNOSIS — E1142 Type 2 diabetes mellitus with diabetic polyneuropathy: Secondary | ICD-10-CM | POA: Diagnosis present

## 2021-01-19 DIAGNOSIS — Z833 Family history of diabetes mellitus: Secondary | ICD-10-CM

## 2021-01-19 DIAGNOSIS — Z7982 Long term (current) use of aspirin: Secondary | ICD-10-CM

## 2021-01-19 DIAGNOSIS — Z880 Allergy status to penicillin: Secondary | ICD-10-CM | POA: Diagnosis not present

## 2021-01-19 DIAGNOSIS — Z955 Presence of coronary angioplasty implant and graft: Secondary | ICD-10-CM

## 2021-01-19 DIAGNOSIS — Z800 Family history of malignant neoplasm of digestive organs: Secondary | ICD-10-CM | POA: Diagnosis not present

## 2021-01-19 DIAGNOSIS — Z7902 Long term (current) use of antithrombotics/antiplatelets: Secondary | ICD-10-CM

## 2021-01-19 DIAGNOSIS — G8191 Hemiplegia, unspecified affecting right dominant side: Secondary | ICD-10-CM | POA: Diagnosis present

## 2021-01-19 DIAGNOSIS — I420 Dilated cardiomyopathy: Secondary | ICD-10-CM | POA: Diagnosis present

## 2021-01-19 DIAGNOSIS — G458 Other transient cerebral ischemic attacks and related syndromes: Principal | ICD-10-CM | POA: Diagnosis present

## 2021-01-19 DIAGNOSIS — Z8673 Personal history of transient ischemic attack (TIA), and cerebral infarction without residual deficits: Secondary | ICD-10-CM | POA: Diagnosis not present

## 2021-01-19 DIAGNOSIS — N400 Enlarged prostate without lower urinary tract symptoms: Secondary | ICD-10-CM | POA: Diagnosis present

## 2021-01-19 DIAGNOSIS — I69351 Hemiplegia and hemiparesis following cerebral infarction affecting right dominant side: Secondary | ICD-10-CM | POA: Diagnosis not present

## 2021-01-19 DIAGNOSIS — E1165 Type 2 diabetes mellitus with hyperglycemia: Secondary | ICD-10-CM | POA: Diagnosis not present

## 2021-01-19 DIAGNOSIS — F17210 Nicotine dependence, cigarettes, uncomplicated: Secondary | ICD-10-CM | POA: Diagnosis present

## 2021-01-19 DIAGNOSIS — Z8679 Personal history of other diseases of the circulatory system: Secondary | ICD-10-CM

## 2021-01-19 DIAGNOSIS — Z7984 Long term (current) use of oral hypoglycemic drugs: Secondary | ICD-10-CM

## 2021-01-19 DIAGNOSIS — I2510 Atherosclerotic heart disease of native coronary artery without angina pectoris: Secondary | ICD-10-CM | POA: Diagnosis present

## 2021-01-19 DIAGNOSIS — I10 Essential (primary) hypertension: Secondary | ICD-10-CM | POA: Diagnosis not present

## 2021-01-19 DIAGNOSIS — I700 Atherosclerosis of aorta: Secondary | ICD-10-CM | POA: Diagnosis present

## 2021-01-19 LAB — COMPREHENSIVE METABOLIC PANEL
ALT: 13 U/L (ref 0–44)
AST: 18 U/L (ref 15–41)
Albumin: 3 g/dL — ABNORMAL LOW (ref 3.5–5.0)
Alkaline Phosphatase: 87 U/L (ref 38–126)
Anion gap: 7 (ref 5–15)
BUN: 13 mg/dL (ref 8–23)
CO2: 24 mmol/L (ref 22–32)
Calcium: 9.1 mg/dL (ref 8.9–10.3)
Chloride: 105 mmol/L (ref 98–111)
Creatinine, Ser: 1.32 mg/dL — ABNORMAL HIGH (ref 0.61–1.24)
GFR, Estimated: >60 mL/min (ref >60)
Glucose, Bld: 127 mg/dL — ABNORMAL HIGH (ref 70–99)
Potassium: 3.8 mmol/L (ref 3.5–5.1)
Sodium: 136 mmol/L (ref 135–145)
Total Bilirubin: 0.6 mg/dL (ref 0.3–1.2)
Total Protein: 6.2 g/dL — ABNORMAL LOW (ref 6.5–8.1)

## 2021-01-19 LAB — GLUCOSE, CAPILLARY
Glucose-Capillary: 123 mg/dL — ABNORMAL HIGH (ref 70–99)
Glucose-Capillary: 207 mg/dL — ABNORMAL HIGH (ref 70–99)
Glucose-Capillary: 117 mg/dL — ABNORMAL HIGH (ref 70–99)
Glucose-Capillary: 159 mg/dL — ABNORMAL HIGH (ref 70–99)

## 2021-01-19 LAB — CBC
HCT: 43.5 % (ref 39.0–52.0)
Hemoglobin: 13.7 g/dL (ref 13.0–17.0)
MCH: 28.7 pg (ref 26.0–34.0)
MCHC: 31.5 g/dL (ref 30.0–36.0)
MCV: 91 fL (ref 80.0–100.0)
Platelets: 234 10*3/uL (ref 150–400)
RBC: 4.78 MIL/uL (ref 4.22–5.81)
RDW: 14.8 % (ref 11.5–15.5)
WBC: 9.4 10*3/uL (ref 4.0–10.5)
nRBC: 0 % (ref 0.0–0.2)

## 2021-01-19 LAB — MRSA NEXT GEN BY PCR, NASAL: MRSA by PCR Next Gen: NOT DETECTED

## 2021-01-19 LAB — ECHOCARDIOGRAM COMPLETE
AR max vel: 0.83 cm2
AV Area VTI: 0.99 cm2
AV Area mean vel: 0.84 cm2
AV Mean grad: 20 mmHg
AV Peak grad: 35.8 mmHg
Ao pk vel: 2.99 m/s
Area-P 1/2: 3.37 cm2
S' Lateral: 3.3 cm

## 2021-01-19 LAB — HEMOGLOBIN A1C
Hgb A1c MFr Bld: 7.4 % — ABNORMAL HIGH (ref 4.8–5.6)
Mean Plasma Glucose: 165.68 mg/dL

## 2021-01-19 LAB — LIPID PANEL
Cholesterol: 130 mg/dL (ref 0–200)
HDL: 23 mg/dL — ABNORMAL LOW (ref >40)
LDL Cholesterol: 85 mg/dL (ref 0–99)
Total CHOL/HDL Ratio: 5.7 RATIO
Triglycerides: 111 mg/dL (ref <150)
VLDL: 22 mg/dL (ref 0–40)

## 2021-01-19 LAB — HIV ANTIBODY (ROUTINE TESTING W REFLEX): HIV Screen 4th Generation wRfx: NONREACTIVE

## 2021-01-19 MED ORDER — ATORVASTATIN CALCIUM 40 MG PO TABS
40.0000 mg | ORAL_TABLET | Freq: Every day | ORAL | Status: DC
  Administered 2021-01-19: 40 mg via ORAL
  Filled 2021-01-19: qty 1

## 2021-01-19 MED ORDER — JUICE PLUS FIBRE PO LIQD: ORAL | Status: DC

## 2021-01-19 MED ORDER — ATORVASTATIN CALCIUM 80 MG PO TABS
80.0000 mg | ORAL_TABLET | Freq: Every day | ORAL | Status: DC
  Administered 2021-01-20: 80 mg via ORAL
  Filled 2021-01-19: qty 1

## 2021-01-19 MED ORDER — ALBUTEROL SULFATE (2.5 MG/3ML) 0.083% IN NEBU: 2.5000 mg | INHALATION_SOLUTION | Freq: Four times a day (QID) | RESPIRATORY_TRACT | Status: DC | PRN

## 2021-01-19 MED ORDER — ACETAMINOPHEN 160 MG/5ML PO SOLN: 650.0000 mg | ORAL | Status: DC | PRN

## 2021-01-19 MED ORDER — ASPIRIN 81 MG PO CHEW
81.0000 mg | CHEWABLE_TABLET | Freq: Every day | ORAL | Status: DC
  Administered 2021-01-19 – 2021-01-20 (×2): 81 mg via ORAL
  Filled 2021-01-19 (×2): qty 1

## 2021-01-19 MED ORDER — INSULIN ASPART 100 UNIT/ML IJ SOLN
0.0000 [IU] | Freq: Every day | INTRAMUSCULAR | Status: DC
Start: 2021-01-19 — End: 2021-01-20

## 2021-01-19 MED ORDER — CHLORHEXIDINE GLUCONATE CLOTH 2 % EX PADS
6.0000 | MEDICATED_PAD | Freq: Every day | CUTANEOUS | Status: DC
  Administered 2021-01-19: 6 via TOPICAL

## 2021-01-19 MED ORDER — ACETAMINOPHEN 325 MG PO TABS: 650.0000 mg | ORAL_TABLET | ORAL | Status: DC | PRN

## 2021-01-19 MED ORDER — INSULIN ASPART 100 UNIT/ML IJ SOLN
0.0000 [IU] | Freq: Three times a day (TID) | INTRAMUSCULAR | Status: DC
  Administered 2021-01-19: 5 [IU] via SUBCUTANEOUS
  Administered 2021-01-20: 3 [IU] via SUBCUTANEOUS
  Administered 2021-01-20: 2 [IU] via SUBCUTANEOUS

## 2021-01-19 MED ORDER — SENNOSIDES-DOCUSATE SODIUM 8.6-50 MG PO TABS: 1.0000 | ORAL_TABLET | Freq: Every evening | ORAL | Status: DC | PRN

## 2021-01-19 MED ORDER — ACETAMINOPHEN 650 MG RE SUPP: 650.0000 mg | RECTAL | Status: DC | PRN

## 2021-01-19 MED ORDER — LIDOCAINE 5 % EX PTCH
1.0000 | MEDICATED_PATCH | CUTANEOUS | Status: DC
  Administered 2021-01-19: 1 via TRANSDERMAL
  Filled 2021-01-19 (×2): qty 1

## 2021-01-19 MED ORDER — PERFLUTREN LIPID MICROSPHERE
1.0000 mL | INTRAVENOUS | Status: AC | PRN
Start: 2021-01-19 — End: 2021-01-19
  Administered 2021-01-19: 4 mL via INTRAVENOUS
  Filled 2021-01-19: qty 10

## 2021-01-19 MED ORDER — LORAZEPAM 2 MG/ML IJ SOLN
1.0000 mg | Freq: Once | INTRAMUSCULAR | Status: AC
  Administered 2021-01-19: 1 mg via INTRAVENOUS
  Filled 2021-01-19: qty 1

## 2021-01-19 MED ORDER — STROKE: EARLY STAGES OF RECOVERY BOOK
Freq: Once | Status: AC
  Administered 2021-01-19: 1
  Filled 2021-01-19: qty 1

## 2021-01-19 MED ORDER — PANTOPRAZOLE SODIUM 40 MG IV SOLR
40.0000 mg | Freq: Every day | INTRAVENOUS | Status: DC
  Administered 2021-01-19: 40 mg via INTRAVENOUS
  Filled 2021-01-19: qty 40

## 2021-01-19 MED ORDER — NICOTINE 21 MG/24HR TD PT24: 21.0000 mg | MEDICATED_PATCH | Freq: Every day | TRANSDERMAL | Status: DC | PRN

## 2021-01-19 MED ORDER — TAMSULOSIN HCL 0.4 MG PO CAPS
0.4000 mg | ORAL_CAPSULE | Freq: Every day | ORAL | Status: DC
  Administered 2021-01-19 – 2021-01-20 (×2): 0.4 mg via ORAL
  Filled 2021-01-19 (×2): qty 1

## 2021-01-19 NOTE — Progress Notes (Addendum)
STROKE TEAM PROGRESS NOTE   INTERVAL HISTORY Patient is seen ambulating with physical therapy. He reports being back to baseline. Hospital course so far has been unremarkable.  He gives prior history of left brain stroke with some residual right-sided weakness and gait ataxia.  Uses a walker for ambulation.  He states his left-sided deficits that resolved completely after receiving IV tPA at [facility] yesterday evening prior to his arrival at [facility].  Blood pressure adequately controlled overnight.  Follow-up post tPA brain imaging is pending Vitals:   01/19/21 0615 01/19/21 0630 01/19/21 0645 01/19/21 0700  BP: 129/73 125/82 127/72 137/74  Pulse: 67 72 67 75  Resp: '17 14 14 13  '$ SpO2: 94% 94% 92% 98%   CBC:  Recent Labs  Lab 01/19/21 0523  WBC 9.4  HGB 13.7  HCT 43.5  MCV 91.0  PLT [id number]   Basic Metabolic Panel:  Recent Labs  Lab 01/19/21 0523  NA 136  K 3.8  CL 105  CO2 24  GLUCOSE 127*  BUN 13  CREATININE 1.32*  CALCIUM 9.1     Lipid Panel:  Recent Labs  Lab 01/19/21 0523  CHOL 130  TRIG 111  HDL 23*  CHOLHDL 5.7  VLDL 22  LDLCALC 85    HgbA1c:  Recent Labs  Lab 01/19/21 0523  HGBA1C 7.4*   Urine Drug Screen: No results for input(s): LABOPIA, COCAINSCRNUR, LABBENZ, AMPHETMU, THCU, LABBARB in the last 168 hours.  Alcohol Level No results for input(s): ETH in the last 168 hours.  NEUROIMAGING: outside films - CT head without contrast with no acute intracranial abnormality, no ICH, no large territory stroke.   - CT angio head and neck with multifocal multivessel stenosis with no significant large vessel occlusion.  ASSESSMENT/PLAN Craig Rollins is a 65 y.o. male with history of   diabetes complicated by diabetic neuropathy, hypertension, hyperlipidemia, prior stroke with residual right-sided weakness, benign prostatic hyperplasia, dilated cardiomyopathy, COPD and smoker, history history of MI and status post 4 stents who  presented to [facility] emergency department with sudden onset right-sided weakness and numbness. He reports that, while he was able to lift his right arm and move his right leg, this was significatnly weaker than his usual state of health.  Patient reports that he was at a band practice when he had sudden onset numbness and weakness of his entire right side that started around 1845 on 01/18/2021.  He reports that his baseline he has mild right-sided numbness in his right face and his right arm.  However, yesterday he had numbness involving the entire right side including his right leg and was very difficult to control his right arm and his right leg.  He was evaluated in the emergency department by teleneurology and he was given tPA at [place] on 01/18/2021.  He reports that shortly after administration of the tPA, his symptoms are completely resolved.  Work-up at [facility] emergency department with CT head without contrast with no acute intracranial abnormality, no ICH, no large territory stroke.  CT angio head and neck was also completed with multifocal multivessel stenosis with no significant large vessel occlusion.  Smokes about 1 pack a day and has been doing this for several decades.  He does not use any recreational drugs.  He does not drink alcohol.  PHYSICAL EXAM:  HENT: Normal oropharynx and mucosa. Normal external appearance of ears and nose.  Neck: Supple, no pain or tenderness  CV: No JVD. No  peripheral edema.  Pulmonary: Symmetric Chest rise. Normal respiratory effort.  Abdomen: Soft to touch, non-tender.  Ext: No cyanosis, edema, or deformity  Skin: Redness and rahs on BL feet(reports that this is from eczema) Normal palpation of skin.   Musculoskeletal: Normal digits and nails by inspection. No clubbing.   Mental status/Cognition: Alert, oriented to self, place, month and year, good attention.  Speech/language: Fluent, comprehension intact, object naming intact, repetition  intact.  Cranial nerves:   CN II Pupils equal and reactive to light, no VF deficits    CN III,IV,VI EOM intact, no gaze preference or deviation, no nystagmus    CN V normal sensation in V1, V2, and V3 segments bilaterally    CN VII no asymmetry, mild right nasolabial fold flattening    CN VIII normal hearing to speech    CN IX & X normal palatal elevation, no uvular deviation    CN XI 5/5 head turn and 5/5 shoulder shrug bilaterally    CN XII midline tongue protrusion    Decreased muscle bulk with normal tone. No pronator drift noted.  Strength 4/5 on right, chronic. And 4+-5/5 on left.  Bilateral mild foot drop and ankle dorsiflexor weakness. Decreased sensation at right face and right arm/ this is baseline, per patient.  Finger to nose intact. Able to stand temporarily on toes and heels while holding onto walker.   Stroke :  left hemispheric infarct secondary to  small vessel disease source versus exacerbation of old deficits treated with IV tPA with complete resolution of new deficits Code Stroke  CT head No acute abnormality.  Small vessel disease. Atrophy.  ASPECTS 10.     CTA head & neck: Aortic atherosclerosis, no LVO  MRI  pending  2D Echo pending LDL 85 HgbA1c 7.4 VTE prophylaxis - scd    Diet   Diet Carb Modified Fluid consistency: Thin; Room service appropriate? Yes   aspirin 81 mg daily and clopidogrel 75 mg daily prior to admission, now will change to aspirin 81 mg daily and Brilinta (ticagrelor) 90 mg bid after repeat imaging done and shows no hemorrhage. Therapy recommendations:  not yet available Disposition:  home  Hypertension Home meds:  lisinopril, coreg, nitroglycerin Stable Permissive hypertension (OK if < 220/120) but gradually normalize in 5-7 days Long-term BP goal normotensive  Hyperlipidemia Home meds:  lipitor '40mg'$ , lipitor '80mg'$   in hospital LDL 85, goal < 70   High intensity statin   Continue statin at discharge  Diabetes type II  Uncontrolled Home meds:  metformin, farxiga, glucotrol HgbA1c 7.4, goal < 7.0 CBGs Recent Labs    01/19/21 0804  GLUCAP 123*    SSI  Other Stroke Risk Factors  Cigarette smoker advised to stop smoking  Obesity,  BMI >/= 30 associated with increased stroke risk, recommend weight loss, diet and exercise as appropriate   Hx stroke/TIA  Coronary artery disease     Hospital day # 0   I have personally obtained history,examined this patient, reviewed notes, independently viewed imaging studies, participated in medical decision making and plan of care.ROS completed by me personally and pertinent positives fully documented  I have made any additions or clarifications directly to the above note. Agree with note above.  Patient presented with worsening of existing right-sided weakness and numbness due to suspected new stroke and received IV tPA with near resolution of his deficits.  Continue close neurological monitoring and strict blood pressure control as per post thrombolytic protocol.  Continue mobilization out of  bed and therapy consults.  Check MRI scan brain later today echocardiogram.  May consider changing to aspirin and Brilinta.  Brain imaging is negative for bleed.  Already on aspirin and Plavix prior to admission.  Discussed with patient and family and answered questions.This patient is critically ill and at significant risk of neurological worsening, death and care requires constant monitoring of vital signs, hemodynamics,respiratory and cardiac monitoring, extensive review of multiple databases, frequent neurological assessment, discussion with family, other specialists and medical decision making of high complexity.I have made any additions or clarifications directly to the above note.This critical care time does not reflect procedure time, or teaching time or supervisory time of PA/NP/Med Resident etc but could involve care discussion time.  I spent 30 minutes of neurocritical care time   in the care of  this patient.     Antony Contras, MD Medical Director [facility] Pager: [phone] 01/19/2021 2:00 PM   To contact Stroke Continuity provider, please refer to [email or web address] After hours, contact General Neurology

## 2021-01-19 NOTE — Progress Notes (Signed)
Patient's belongings on admission: cell phone, cell phone charger, wallet, glasses, car keys, pocket knife, chap stick, vile of nitroglycerin, coin holder+change, pants, belt, shirt, shoes.

## 2021-01-19 NOTE — TOC Initial Note (Addendum)
Transition of Care [facility]) - Initial/Assessment Note    Patient Details  Name: Craig Rollins MRN: [id number] Date of Birth: 18-Aug-1955  Transition of Care [facility]) CM/SW Contact:    Verdell Carmine, RN Phone Number: 01/19/2021, 3:17 PM  Clinical Narrative:                  65 year old with history of DM, CVA transferred from [facility] with Acute CVA. Patient has already had PT evaluate him, home health PT recommended. Patient feels back to baseline according to PT report.  Attempted to call pateint on mobile phone and spoke to RN, will  attempt to call patient back about [facility] agencies.  [facility] would be close to patient. And patient uses [place] area for primary care.   1600 left my number on mobile for return call. Referral sent to [facility] for screen and review. Rolling walker with 5 inch wheels ordered from [employer].   CM will follow Expected Discharge Plan: [place] Barriers to Discharge: Continued Medical Work up   Patient Goals and CMS Choice        Expected Discharge Plan and Services Expected Discharge Plan: [place] Arranged: PT          Prior Living Arrangements/Services     Patient language and need for interpreter reviewed:: Yes        Need for Family Participation in Patient Care: Yes (Comment) Care giver support system in place?: Yes (comment)   Criminal Activity/Legal Involvement Pertinent to Current Situation/Hospitalization: No - Comment as needed  Activities of Daily Living   ADL Screening (condition at time of admission) Patient's cognitive ability adequate to safely complete daily activities?: Yes Is the patient deaf or have difficulty hearing?: No Does the patient have difficulty seeing, even when wearing glasses/contacts?: No Does the patient have difficulty concentrating, remembering, or making decisions?: No Patient able to express need for assistance  with ADLs?: Yes Does the patient have difficulty dressing or bathing?: Yes Independently performs ADLs?: No Communication: Independent Dressing (OT): Needs assistance Is this a change from baseline?: Change from baseline, expected to last <3days Grooming: Needs assistance Is this a change from baseline?: Change from baseline, expected to last <3 days Feeding: Independent Bathing: Needs assistance Is this a change from baseline?: Change from baseline, expected to last <3 days Toileting: Needs assistance Is this a change from baseline?: Change from baseline, expected to last >3days In/Out Bed: Needs assistance Is this a change from baseline?: Change from baseline, expected to last >3 days Walks in Home: Needs assistance Is this a change from baseline?: Change from baseline, expected to last >3 days Does the patient have difficulty walking or climbing stairs?: Yes Weakness of Legs: Both Weakness of Arms/Hands: Both  Permission Sought/Granted                  Emotional Assessment       Orientation: : Oriented to Situation, Oriented to  Time, Oriented to Place, Oriented to Self Alcohol / Substance Use: Not Applicable Psych Involvement: No (comment)  Admission diagnosis:  Stroke [facility]) [I63.9] Patient Active Problem List   Diagnosis Date Noted   Chest pain of uncertain etiology    Hypertensive heart disease 04/27/2020   TIA (  transient ischemic attack)    Myocardial infarction ([place])    High cholesterol    Diabetes ([place])    Cataract    Brain aneurysm    Hypercholesteremia 11/23/2019   Unstable angina (HCC) 03/30/2019   Chest discomfort 08/13/2018   CHF (congestive heart failure) ([place]) 08/13/2018   COPD (chronic obstructive pulmonary disease) ([place]) 08/13/2018   Open wound 08/13/2018   Shortness of breath 08/13/2018   CAD in native artery 02/18/2018   Aortic stenosis 02/18/2018   Essential hypertension 02/18/2018   Foot swelling 01/26/2018   Diabetes mellitus, type 2  ([place]) 01/26/2018   Diabetic peripheral neuropathy ([place]) 02/25/2017   Hammer toes of both feet 02/25/2017   Risk for falls 09/23/2016   NSTEMI (non-ST elevated myocardial infarction) ([place]) 08/30/2016   Wound eschar of foot 08/21/2016   Dilated cardiomyopathy ([place]) 07/16/2016   Dyslipidemia (high LDL; low HDL) 07/16/2016   Noncompliance 07/16/2016   Smoking 07/16/2016   Stroke ([place]) 12/19/2015   PCP:  [name], NP Pharmacy:   [place] [place] [address] Phone: [phone] Fax: [phone]     Social Determinants of Health (SDOH) Interventions    Readmission Risk Interventions No flowsheet data found.

## 2021-01-19 NOTE — H&P (Signed)
NEUROLOGY CONSULTATION NOTE   Date of service: January 19, 2021 Patient Name: Craig Rollins MRN:  [id number] DOB:  12-23-1955 Reason for consult: "R sided numbness and weakness s/p tPA" Requesting Provider: Donnetta Simpers, MD _ _ _   _ __   _ __ _ _  __ __   _ __   __ _  History of Present Illness  Craig Rollins is a 65 y.o. male with PMH significant for diabetes complicated by diabetic neuropathy, hypertension, hyperlipidemia, prior stroke with residual right-sided weakness, benign prostatic hyperplasia, dilated cardiomyopathy, COPD and smoker, history history of MI and status post 4 stents who presented to [facility] emergency department with sudden onset right-sided weakness and numbness.  Patient reports that he was at a band practice when he had sudden onset numbness and weakness of his entire right side that started around 1845 on 01/18/2021.  He reports that his baseline he has mild right-sided numbness in his right face and his right arm.  However, yesterday he had numbness involving the entire right side including his right leg and was very difficult to control his right arm and his right leg.  He was evaluated in the emergency department by teleneurology and he was given tPA at [place] on 01/18/2021.  He reports that shortly after administration of the tPA, his symptoms are completely resolved.  Work-up at [facility] emergency department with CT head without contrast which I personally reviewed with no acute intracranial abnormality, no ICH, no large territory stroke.  CT angio head and neck was also completed with multifocal multivessel stenosis with no significant large vessel occlusion.  He reports that he continues to smoke despite being told by multiple providers to quit smoking.  Smokes about 1 pack a day and has been doing this for several decades.  He does not use any recreational drugs.  He does not drink alcohol.  He lives by himself and is able to do  almost all activities of daily living with some assistance from his friend for groceries.  MR S: 0 tPA: Given at outside hospital at [place] on 01/18/2021. Thrombectomy: Not offered due to no LVO.  NIHSS components Score: Comment  1a Level of Conscious 0'[x]'$  1'[]'$  2'[]'$  3'[]'$      1b LOC Questions 0'[x]'$  1'[]'$  2'[]'$       1c LOC Commands 0'[x]'$  1'[]'$  2'[]'$       2 Best Gaze 0'[x]'$  1'[]'$  2'[]'$       3 Visual 0'[x]'$  1'[]'$  2'[]'$  3'[]'$      4 Facial Palsy 0'[x]'$  1'[]'$  2'[]'$  3'[]'$      5a Motor Arm - left 0'[x]'$  1'[]'$  2'[]'$  3'[]'$  4'[]'$  UN'[]'$    5b Motor Arm - Right 0'[x]'$  1'[]'$  2'[]'$  3'[]'$  4'[]'$  UN'[]'$    6a Motor Leg - Left 0'[x]'$  1'[]'$  2'[]'$  3'[]'$  4'[]'$  UN'[]'$    6b Motor Leg - Right 0'[x]'$  1'[]'$  2'[]'$  3'[]'$  4'[]'$  UN'[]'$    7 Limb Ataxia 0'[x]'$  1'[]'$  2'[]'$  3'[]'$  UN'[]'$     8 Sensory 0'[]'$  1'[x]'$  2'[]'$  UN'[]'$      9 Best Language 0'[x]'$  1'[]'$  2'[]'$  3'[]'$      10 Dysarthria 0'[x]'$  1'[]'$  2'[]'$  UN'[]'$      11 Extinct. and Inattention 0'[x]'$  1'[]'$  2'[]'$       TOTAL: 1       ROS   Constitutional Denies weight loss, fever and chills.   HEENT Denies changes in vision and hearing.   Respiratory Denies SOB and cough.   CV Denies palpitations and CP   GI Denies abdominal pain, nausea, vomiting and diarrhea.  GU Denies dysuria and urinary frequency.   MSK Denies myalgia and joint pain.   Skin Denies rash and pruritus.   Neurological Denies headache and syncope.   Psychiatric Denies recent changes in mood. Denies anxiety and depression.    Past History   Past Medical History:  Diagnosis Date  . Aortic stenosis 02/18/2018  . Brain aneurysm   . CAD in native artery 02/18/2018  . Cataract   . Chest discomfort 08/13/2018  . CHF (congestive heart failure) ([place])   . COPD (chronic obstructive pulmonary disease) ([place])   . Diabetes ([place])   . Diabetes mellitus, type 2 ([place]) 01/26/2018  . Diabetic peripheral neuropathy ([place]) 02/25/2017  . Dilated cardiomyopathy ([place]) 07/16/2016   Ejection fraction 30% based on echo from 2018  . Dyslipidemia (high LDL; low HDL) 07/16/2016  . Essential hypertension 02/18/2018  . Foot swelling 01/26/2018  .  Hammer toes of both feet 02/25/2017  . High cholesterol   . Hypercholesteremia 11/23/2019  . Myocardial infarction ([place])   . Noncompliance 07/16/2016  . NSTEMI (non-ST elevated myocardial infarction) ([place]) 08/30/2016  . Shortness of breath 08/13/2018  . Smoking 07/16/2016  . Stroke ([place])   . TIA (transient ischemic attack)   . Unstable angina ([place]) 03/30/2019  . Wound eschar of foot 08/21/2016   Past Surgical History:  Procedure Laterality Date  . CORONARY ANGIOPLASTY WITH STENT PLACEMENT    . KNEE SURGERY    . LEFT HEART CATH AND CORONARY ANGIOGRAPHY N/A 05/05/2020   Procedure: LEFT HEART CATH AND CORONARY ANGIOGRAPHY;  Surgeon: Burnell Blanks, MD;  Location: [facility];  Service: Cardiovascular;  Laterality: N/A;   Family History  Problem Relation Age of Onset  . Hypertension Father   . Hyperlipidemia Father   . Heart disease Father   . Heart attack Father   . Diabetes Father   . Liver cancer Maternal Grandfather   . Breast cancer Paternal Grandmother   . Heart attack Paternal Grandfather    Social History   Socioeconomic History  . Marital status: Single    Spouse name: Not on file  . Number of children: Not on file  . Years of education: Not on file  . Highest education level: Not on file  Occupational History  . Not on file  Tobacco Use  . Smoking status: Every Day    Packs/day: 1.00    Years: 55.00    Pack years: 55.00    Types: Cigarettes  . Smokeless tobacco: Never  Vaping Use  . Vaping Use: Former  Substance and Sexual Activity  . Alcohol use: Not Currently  . Drug use: Never  . Sexual activity: Not on file  Other Topics Concern  . Not on file  Social History Narrative  . Not on file   Social Determinants of Health   Financial Resource Strain: Not on file  Food Insecurity: Not on file  Transportation Needs: Not on file  Physical Activity: Not on file  Stress: Not on file  Social Connections: Not on file   Allergies  Allergen  Reactions  . Penicillins Nausea And Vomiting    Reaction: Childhood  . Milk Protein Itching and Hives  . Milk-Related Compounds Hives and Itching    Medications   Medications Prior to Admission  Medication Sig Dispense Refill Last Dose  . albuterol (VENTOLIN HFA) 108 (90 Base) MCG/ACT inhaler Inhale 2 puffs into the lungs every 6 (six) hours as needed for wheezing or shortness of breath.     Marland Kitchen  aspirin EC 81 MG tablet Take 81 mg by mouth daily. Swallow whole.     Marland Kitchen atorvastatin (LIPITOR) 40 MG tablet Take 40 mg by mouth daily.     . carvedilol (COREG) 3.125 MG tablet Take 6.25 mg by mouth every 12 (twelve) hours.     . clopidogrel (PLAVIX) 75 MG tablet TAKE 1 TABLET BY MOUTH DAILY 90 tablet 2   . dapagliflozin propanediol (FARXIGA) 10 MG TABS tablet Take 1 tablet (10 mg total) by mouth daily before breakfast. 90 tablet 3   . diphenhydrAMINE (BENADRYL) 25 MG tablet Take 25 mg by mouth 2 (two) times daily.     . finasteride (PROSCAR) 5 MG tablet Take 5 mg by mouth daily.     . furosemide (LASIX) 40 MG tablet Take 40 mg by mouth daily.     Marland Kitchen glipiZIDE (GLUCOTROL) 5 MG tablet Take 2.5 mg by mouth 2 (two) times daily before a meal.     . lisinopril (ZESTRIL) 5 MG tablet Take 2.5 mg by mouth daily.     . metFORMIN (GLUCOPHAGE) 500 MG tablet Take 500 mg by mouth 2 (two) times daily with a meal.     . nitroGLYCERIN (NITROSTAT) 0.4 MG SL tablet Place 0.4 mg under the tongue every 5 (five) minutes as needed for chest pain.     . Nutritional Supplements (JUICE PLUS FIBRE PO) Take 8 capsules by mouth See admin instructions. Vegetable 4 capsule  and fruit 4 capsule daily     . tamsulosin (FLOMAX) 0.4 MG CAPS capsule Take 0.4 mg by mouth daily.     Marland Kitchen topiramate (TOPAMAX) 50 MG tablet Take 1 tablet (50 mg total) by mouth daily. 30 tablet 3   . TRESIBA FLEXTOUCH 100 UNIT/ML FlexTouch Pen Inject 12 Units into the skin at bedtime.     . triamcinolone cream (KENALOG) 0.1 % Apply 1 application topically 2  (two) times daily.     . varenicline (CHANTIX STARTING MONTH PAK) 0.5 MG X 11 & 1 MG X 42 tablet Take one 0.5 mg tablet by mouth once daily for 3 days, then increase to one 0.5 mg tablet twice daily for 4 days, then increase to one 1 mg tablet twice daily. 53 tablet 0      Vitals   There were no vitals filed for this visit.   There is no height or weight on file to calculate BMI.  Physical Exam   General: Laying comfortably in bed; in no acute distress.  HENT: Normal oropharynx and mucosa. Normal external appearance of ears and nose.  Neck: Supple, no pain or tenderness  CV: No JVD. No peripheral edema.  Pulmonary: Symmetric Chest rise. Normal respiratory effort.  Abdomen: Soft to touch, non-tender.  Ext: No cyanosis, edema, or deformity  Skin: Redness and rahs on BL feet(reports that this is from eczema) Normal palpation of skin.   Musculoskeletal: Normal digits and nails by inspection. No clubbing.   Neurologic Examination  Mental status/Cognition: Alert, oriented to self, place, month and year, good attention.  Speech/language: Fluent, comprehension intact, object naming intact, repetition intact.  Cranial nerves:   CN II Pupils equal and reactive to light, no VF deficits    CN III,IV,VI EOM intact, no gaze preference or deviation, no nystagmus    CN V normal sensation in V1, V2, and V3 segments bilaterally    CN VII no asymmetry, no nasolabial fold flattening    CN VIII normal hearing to speech    CN IX &  X normal palatal elevation, no uvular deviation    CN XI 5/5 head turn and 5/5 shoulder shrug bilaterally    CN XII midline tongue protrusion    Motor:  Muscle bulk: poor, tone normal, pronator drift none tremor none Mvmt Root Nerve  Muscle Right Left Comments  SA C5/6 Ax Deltoid 4+ 5   EF C5/6 Mc Biceps 4+ 5   EE C6/7/8 Rad Triceps 4+ 5   WF C6/7 Med FCR     WE C7/8 PIN ECU     F Ab C8/T1 U ADM/FDI 4+ 5   HF L1/2/3 Fem Illopsoas 4 4+   KE L2/3/4 Fem Quad 4+ 5   DF  L4/5 D Peron Tib Ant 4+ 4+   PF S1/2 Tibial Grc/Sol 4+ 4+    Reflexes:  Right Left Comments  Pectoralis      Biceps (C5/6) 1 1   Brachioradialis (C5/6) 1 1    Triceps (C6/7) 1 1    Patellar (L3/4) 1 1    Achilles (S1)      Hoffman      Plantar     Jaw jerk    Sensation:  Light touch Decreased in R face and R arm which is his baseline with BL leg numbness in feet and lower third of the leg which is chronic and related to his diabetic neuropathy.   Pin prick    Temperature    Vibration   Proprioception    Coordination/Complex Motor:  - Finger to Nose intact BL - Heel to shin unable to do. - Rapid alternating movement are slowed - Gait: unsafe to assess given his post tPA status.  Labs   CBC: No results for input(s): WBC, NEUTROABS, HGB, HCT, MCV, PLT in the last 168 hours.  Basic Metabolic Panel:  Lab Results  Component Value Date   NA 144 04/28/2020   K 4.5 04/28/2020   CO2 25 04/28/2020   GLUCOSE 142 (H) 04/28/2020   BUN 22 04/28/2020   CREATININE 1.32 (H) 04/28/2020   CALCIUM 9.5 04/28/2020   GFRNONAA 57 (L) 04/28/2020   GFRAA 66 04/28/2020   Lipid Panel:  Lab Results  Component Value Date   LDLCALC 52 04/01/2018   HgbA1c: No results found for: HGBA1C Urine Drug Screen: No results found for: LABOPIA, COCAINSCRNUR, LABBENZ, AMPHETMU, THCU, LABBARB  Alcohol Level No results found for: [place]  CT Head without contrast(personally reviewed): CTH was negative for a large hypodensity concerning for a large territory infarct or hyperdensity concerning for an ICH. ASPECTS of 10.  CT angio Head and Neck with contrast: Aortic atherosclerosis, no LVO  MRI Brain: Pending  Impression   Craig Rollins is a 65 y.o. male with PMH significant for diabetes complicated by diabetic neuropathy, hypertension, hyperlipidemia, prior stroke with residual right-sided weakness, benign prostatic hyperplasia, dilated cardiomyopathy, COPD and smoker, history history of MI and  status post 4 stents who presented to [facility] emergency department with sudden onset right-sided weakness and numbness. He was given tPA and transferred to [facility] for close monitoring and further evaluation. His neurologic examination is notable for mild RUE and R face numbness along with mild RUE and RLE weakness. Reports that these are his baseline symptoms.  Primary Diagnosis:  Cerebral infarction, unspecified.  Secondary Diagnosis: Essential (primary) hypertension, Type 2 diabetes mellitus w/o complications, and Obesity S/p tPA at outside hospital within 24 hours. Chronic smoker.   Recommendations  Plan: - Frequent NeuroChecks for post tPA care per stroke  unit protocol: - Initial CTH demonstrated no acute hemorrhage or mass - MRI Brain - pending. - CTA - no LVO - TTE - pending. - Lipid Panel: LDL - pending.  - Statin: Continue home Atorvastatin '40mg'$  daily for now. - HbA1c: pending - Antithrombotic: Start ASA 81 mg daily if 24 h CTH does not show acute hemorrhage - DVT prophylaxis: SCDs. Pharmacologic prophylaxis if 24 h CTH does not demonstrate acute hemorrhage - Smoking cessation: counseled on the importance of quitting smoking. - Systolic Blood Pressure goal: < 180 mm Hg - Telemetry monitoring for arrhythmia: 72 hours - Swallow screen - ordered - PT/OT/SLP consults  Diabetes with diabetic neuropathy: - Carb controlled diet with sliding scale insulin and carb correction. - Hemoglobin A1c is pending.  Hypertension: - We will use as needed labetalol post tPA for blood pressure control. - SBP goal is less than 180 mmHg.  Benign prostatic hyperplasia: - Continue home Flomax and Proscar.  COPD: - Continue home albuterol.  Smoker: - Nicotine patch as needed. - Counseled importance of quitting smoking to reduce risk of strokes in the future.   CODE STATUS: Full code.  Would like his friend Rosalene Billings to make decisions on his behalf if he is unable to make  decisions by himself at any point.   __________________________________________________________________  This patient is critically ill and at significant risk of neurological worsening, death and care requires constant monitoring of vital signs, hemodynamics,respiratory and cardiac monitoring, neurological assessment, discussion with family, other specialists and medical decision making of high complexity. I spent 60 minutes of neurocritical care time  in the care of  this patient. This was time spent independent of any time provided by nurse practitioner or PA.  Donnetta Simpers [facility] Pager Number [phone] 01/19/2021  5:38 AM   Thank you for the opportunity to take part in the care of this patient. If you have any further questions, please contact the neurology consultation attending.  Signed,  [place] Pager Number [phone] _ _ _   _ __   _ __ _ _  __ __   _ __   __ _

## 2021-01-19 NOTE — Evaluation (Signed)
Physical Therapy Evaluation Patient Details Name: Craig Rollins MRN: [id number] DOB: March 28, 1956 Today's Date: 01/19/2021   History of Present Illness  65 yo male with sudden onset of R weakness and numnbess. TPA 9/1 NIH 1. MRI pending. PMH: DM diabetic neuropathy HTN HLD CVA with R weakness benign prostatic hyperplasia COPD smoker [facility] s/p 4 stents brain aneurysm CHF  Clinical Impression  Patient admitted with above diagnosis. PTA, patient lived alone in a duplex with 3 stairs to enter and unsafe railing. Patient reports ambulating with no AD primarily but uses RW as needed. Patient reports at least 3 falls in past 3 months. Patient presents with generalized weakness (R>L), impaired sensation, impaired coordination, decreased activity tolerance, impaired balance, and poor safety awareness. Patient requires min guard for ambulation with RW due to unsteadiness without AD. Patient negotiated 2 stairs with L hand rail and minA, demos significantly wide BOS during stairs and throughout mobility during session. Patient reports dizziness during ambulation that is more than his baseline, see General comments for 17 point drop in BP. Encouraged use of RW at discharge to safety and stability, patient verbalized understanding. Patient will benefit from skilled PT services during acute stay to address listed deficits. Recommend HHPT following discharge to address deficits in balance and strength as well as safety in the home.     Follow Up Recommendations Home health PT    Equipment Recommendations  Rolling Ninamarie Keel with 5" wheels    Recommendations for Other Services       Precautions / Restrictions Precautions Precautions: Fall Precaution Comments: residual R side weakness from previous stroke Restrictions Weight Bearing Restrictions: No      Mobility  Bed Mobility               General bed mobility comments: sitting EOB with OT on arrival    Transfers Overall  transfer level: Needs assistance Equipment used: None Transfers: Sit to/from Stand Sit to Stand: Min guard         General transfer comment: min guard for safety. Patient using momentum and signifcantly wide BOS to stand  Ambulation/Gait Ambulation/Gait assistance: Min guard Gait Distance (Feet): 120 Feet Assistive device: Rolling Cammie Faulstich (2 wheeled) Gait Pattern/deviations: Step-to pattern;Decreased stride length;Decreased weight shift to right;Decreased weight shift to left;Trunk flexed;Wide base of support Gait velocity: decreased   General Gait Details: min guard for safety with RW due to unsteadiness without AD. Patient with wide BOS throughout. Able to follow multi step commands for wayfinding during ambulation. Patient reports dizziness during ambulation which he states is more than his normal, see below.  Stairs Stairs: Yes Stairs assistance: Min assist;+2 safety/equipment Stair Management: One rail Left;Step to pattern;Forwards Number of Stairs: 2 General stair comments: minA for balance and +2 for safety. Patient with wide BOS with negotiating steps and heavy reliance on rail. Patient with poor safety awareness on stairs  Wheelchair Mobility    Modified Rankin (Stroke Patients Only)       Balance Overall balance assessment: Needs assistance Sitting-balance support: No upper extremity supported;Feet supported Sitting balance-Leahy Scale: Fair       Standing balance-Leahy Scale: Poor Standing balance comment: reliant on UE support and external assist                             Pertinent Vitals/Pain Pain Assessment: Faces Faces Pain Scale: No hurt    Home Living Family/patient expects to be discharged to:: Private residence Living  Arrangements: Alone Available Help at Discharge: Available PRN/intermittently;Friend(s) Type of Home: Other(Comment) (duplex) Home Access: Stairs to enter Entrance Stairs-Rails: Can reach both (wobbly) Entrance  Stairs-Number of Steps: 3 Home Layout: One level Home Equipment: [place] - 2 wheels      Prior Function Level of Independence: Independent         Comments: has RW but states he uses it when he needs it. Drives, does not work. Reports 3 falls in past 3 months. With the last fall, patient had to call EMS to get him up     Hand Dominance        Extremity/Trunk Assessment   Upper Extremity Assessment Upper Extremity Assessment: Defer to OT evaluation    Lower Extremity Assessment Lower Extremity Assessment: Generalized weakness;RLE deficits/detail;LLE deficits/detail RLE Sensation: history of peripheral neuropathy RLE Coordination: decreased fine motor;decreased gross motor LLE Sensation: history of peripheral neuropathy LLE Coordination: decreased fine motor;decreased gross motor       Communication   Communication: No difficulties  Cognition Arousal/Alertness: Awake/alert Behavior During Therapy: WFL for tasks assessed/performed Overall Cognitive Status: No family/caregiver present to determine baseline cognitive functioning                                 General Comments: Following commands appropriately but demos poor safety awareness      General Comments General comments (skin integrity, edema, etc.): BP sitting 125/75, during ambulation(complaints of dizziness) 108/75, standing at end of ambulation 114/70    Exercises     Assessment/Plan    PT Assessment Patient needs continued PT services  PT Problem List Decreased strength;Decreased activity tolerance;Decreased balance;Decreased mobility;Decreased coordination;Decreased cognition;Decreased safety awareness;Decreased knowledge of precautions;Impaired sensation       PT Treatment Interventions DME instruction;Gait training;Stair training;Functional mobility training;Therapeutic activities;Therapeutic exercise;Balance training;Patient/family education    PT Goals (Current goals can be found  in the Care Plan section)  Acute Rehab PT Goals Patient Stated Goal: to go home PT Goal Formulation: With patient Time For Goal Achievement: 02/02/21 Potential to Achieve Goals: Fair    Frequency Min 3X/week   Barriers to discharge Decreased caregiver support      Co-evaluation               AM-PAC PT "6 Clicks" Mobility  Outcome Measure Help needed turning from your back to your side while in a flat bed without using bedrails?: A Little Help needed moving from lying on your back to sitting on the side of a flat bed without using bedrails?: A Little Help needed moving to and from a bed to a chair (including a wheelchair)?: A Little Help needed standing up from a chair using your arms (e.g., wheelchair or bedside chair)?: A Little Help needed to walk in hospital room?: A Little Help needed climbing 3-5 steps with a railing? : A Little 6 Click Score: 18    End of Session Equipment Utilized During Treatment: Gait belt Activity Tolerance: Patient tolerated treatment well Patient left: in chair;with call bell/phone within reach;with chair alarm set Nurse Communication: Mobility status PT Visit Diagnosis: Unsteadiness on feet (R26.81);Muscle weakness (generalized) (M62.81);History of falling (Z91.81);Other abnormalities of gait and mobility (R26.89);Dizziness and giddiness (R42)    Time: [id number] PT Time Calculation (min) (ACUTE ONLY): 37 min   Charges:   PT Evaluation $PT Eval Moderate Complexity: 1 Mod PT Treatments $Gait Training: 8-22 mins        Jonanthony Nahar A. Gilford Rile, PT,  DPT Acute Rehabilitation Services Pager [phone] Office [phone]   Linna Hoff 01/19/2021, 12:25 PM

## 2021-01-19 NOTE — Progress Notes (Signed)
  Echocardiogram 2D Echocardiogram has been performed.  Craig Rollins 01/19/2021, 2:33 PM

## 2021-01-19 NOTE — Evaluation (Signed)
Occupational Therapy Evaluation Patient Details Name: Craig Rollins MRN: [id number] DOB: 30-Oct-1955 Today's Date: 01/19/2021    History of Present Illness 65 yo male with sudden onset of R weakness and numnbess. TPA 9/1 NIH 1. MRI pending. PMH: DM diabetic neuropathy HTN HLD CVA with R weakness benign prostatic hyperplasia COPD smoker [place] [facility]) s/p 4 stents brain aneurysm CHF   Clinical Impression   PT admitted with R side weakness with NIH 1. Pt currently with functional limitiations due to the deficits listed below (see OT problem list). Pt currently self reports 3 falls in 3 months and demonstrates poor balance. Pt walks with a widen base of support and occasionally DME use at home. Pt high fall risk and encourage use of DME at all times. Pt has amputation R foot could benefit from use of orthopedic shoes with movement. Pt reports rails on home are not stable but also states that he has not asked for them to be fixed by landlord. Pt only states "I am about to move" when asked where or when. Pt states "I dont know but anywhere else" Pt will benefit from skilled OT to increase their independence and safety with adls and balance to allow discharge hhot.     Follow Up Recommendations  Home health OT    Equipment Recommendations  Other (comment) (RW)    Recommendations for Other Services       Precautions / Restrictions Precautions Precautions: Fall Precaution Comments: residual R side weakness from previous stroke Required Braces or Orthoses: Knee Immobilizer - Right Restrictions Weight Bearing Restrictions: No      Mobility Bed Mobility Overal bed mobility: Modified Independent             General bed mobility comments: sitting EOB with OT on arrival    Transfers Overall transfer level: Needs assistance Equipment used: None Transfers: Sit to/from Stand Sit to Stand: Min guard         General transfer comment: min guard for safety. Patient using  momentum and signifcantly wide BOS to stand    Balance Overall balance assessment: Needs assistance Sitting-balance support: No upper extremity supported;Feet supported Sitting balance-Leahy Scale: Fair       Standing balance-Leahy Scale: Poor Standing balance comment: requires external supoport                           ADL either performed or assessed with clinical judgement   ADL Overall ADL's : Needs assistance/impaired Eating/Feeding: Modified independent   Grooming: Wash/dry hands;Moderate assistance;Standing Grooming Details (indicate cue type and reason): lob at sink x2 Upper Body Bathing: Set up   Lower Body Bathing: Moderate assistance   Upper Body Dressing : Set up   Lower Body Dressing: Moderate assistance   Toilet Transfer: Minimal assistance;Grab bars Toilet Transfer Details (indicate cue type and reason): requires (A) to elevate from low commode         Functional mobility during ADLs: Min guard;Rolling walker General ADL Comments: pt with very widen base of support and risk for falling     Vision Baseline Vision/History: 1 Wears glasses Ability to See in Adequate Light: 1 Impaired Vision Assessment?: No apparent visual deficits Additional Comments: pt reports vision is always changing. pt states its good today i can see further away     Perception     Praxis      Pertinent Vitals/Pain Pain Assessment: No/denies pain Faces Pain Scale: No hurt  Hand Dominance Right   Extremity/Trunk Assessment Upper Extremity Assessment Upper Extremity Assessment: Overall WFL for tasks assessed   Lower Extremity Assessment Lower Extremity Assessment: Defer to PT evaluation RLE Sensation: history of peripheral neuropathy RLE Coordination: decreased fine motor;decreased gross motor LLE Sensation: history of peripheral neuropathy LLE Coordination: decreased fine motor;decreased gross motor   Cervical / Trunk Assessment Cervical / Trunk  Assessment: Normal   Communication Communication Communication: No difficulties   Cognition Arousal/Alertness: Awake/alert Behavior During Therapy: WFL for tasks assessed/performed Overall Cognitive Status: No family/caregiver present to determine baseline cognitive functioning                                 General Comments: lacks awareness to fall risk. pt with LOB and needing (A) for LOB but does not seem alarmed by this. Pt states "i am always unsteady" "pt states my vision is always changing"   General Comments  BP sitting 125/75, during ambulation(complaints of dizziness) 108/75, standing at end of ambulation 114/70    Exercises     Shoulder Instructions      Home Living Family/patient expects to be discharged to:: Private residence Living Arrangements: Alone Available Help at Discharge: Available PRN/intermittently;Friend(s) Type of Home: Other(Comment) Home Access: Stairs to enter Entrance Stairs-Number of Steps: 3 Entrance Stairs-Rails: Can reach both Home Layout: One level     Bathroom Shower/Tub: Teacher, early years/pre: Standard     Home Equipment: Environmental consultant - 2 wheels;Bedside commode          Prior Functioning/Environment Level of Independence: Independent        Comments: has RW but states he uses it when he needs it. Drives, does not work. Reports 3 falls in past 3 months. With the last fall, patient had to call EMS to get him up        OT Problem List: Decreased strength;Impaired balance (sitting and/or standing);Decreased activity tolerance;Decreased safety awareness;Decreased cognition      OT Treatment/Interventions: Self-care/ADL training;Therapeutic exercise;Neuromuscular education;DME and/or AE instruction;Energy conservation;Therapeutic activities;Cognitive remediation/compensation;Visual/perceptual remediation/compensation;Patient/family education;Balance training    OT Goals(Current goals can be found in the care  plan section) Acute Rehab OT Goals Patient Stated Goal: to go home OT Goal Formulation: With patient Time For Goal Achievement: 02/02/21 Potential to Achieve Goals: Fair  OT Frequency: Min 2X/week   Barriers to D/C: Decreased caregiver support          Co-evaluation              AM-PAC OT "6 Clicks" Daily Activity     Outcome Measure Help from another person eating meals?: A Little Help from another person taking care of personal grooming?: A Little Help from another person toileting, which includes using toliet, bedpan, or urinal?: A Little Help from another person bathing (including washing, rinsing, drying)?: A Little Help from another person to put on and taking off regular upper body clothing?: A Little Help from another person to put on and taking off regular lower body clothing?: A Little 6 Click Score: 18   End of Session Equipment Utilized During Treatment: Gait belt;Rolling walker Nurse Communication: Mobility status;Precautions  Activity Tolerance: Patient tolerated treatment well Patient left: in chair;with call bell/phone within reach;with chair alarm set  OT Visit Diagnosis: Unsteadiness on feet (R26.81);Muscle weakness (generalized) (M62.81)                Time: [id number] OT Time Calculation (min): 33 min  Charges:  OT General Charges $OT Visit: 1 Visit OT Evaluation $OT Eval Moderate Complexity: 1 Mod   Brynn, OTR/L  Acute Rehabilitation Services Pager: [phone] Office: [phone] .   Jeri Modena 01/19/2021, 2:53 PM

## 2021-01-20 DIAGNOSIS — E7800 Pure hypercholesterolemia, unspecified: Secondary | ICD-10-CM

## 2021-01-20 DIAGNOSIS — Z9282 Status post administration of tPA (rtPA) in a different facility within the last 24 hours prior to admission to current facility: Secondary | ICD-10-CM

## 2021-01-20 DIAGNOSIS — E1165 Type 2 diabetes mellitus with hyperglycemia: Secondary | ICD-10-CM | POA: Diagnosis not present

## 2021-01-20 DIAGNOSIS — Z8673 Personal history of transient ischemic attack (TIA), and cerebral infarction without residual deficits: Secondary | ICD-10-CM | POA: Diagnosis not present

## 2021-01-20 DIAGNOSIS — G459 Transient cerebral ischemic attack, unspecified: Secondary | ICD-10-CM | POA: Diagnosis not present

## 2021-01-20 DIAGNOSIS — I10 Essential (primary) hypertension: Secondary | ICD-10-CM

## 2021-01-20 LAB — RAPID URINE DRUG SCREEN, HOSP PERFORMED
Amphetamines: NOT DETECTED
Barbiturates: NOT DETECTED
Benzodiazepines: POSITIVE — AB
Cocaine: NOT DETECTED
Opiates: NOT DETECTED
Tetrahydrocannabinol: NOT DETECTED

## 2021-01-20 LAB — GLUCOSE, CAPILLARY
Glucose-Capillary: 155 mg/dL — ABNORMAL HIGH (ref 70–99)
Glucose-Capillary: 133 mg/dL — ABNORMAL HIGH (ref 70–99)

## 2021-01-20 MED ORDER — CLOPIDOGREL BISULFATE 75 MG PO TABS
75.0000 mg | ORAL_TABLET | Freq: Every day | ORAL | Status: DC
  Administered 2021-01-20: 75 mg via ORAL
  Filled 2021-01-20: qty 1

## 2021-01-20 MED ORDER — ATORVASTATIN CALCIUM 80 MG PO TABS: 80.0000 mg | ORAL_TABLET | Freq: Every day | ORAL | 2 refills | Status: AC

## 2021-01-20 NOTE — Progress Notes (Signed)
Occupational Therapy Treatment Patient Details Name: Rickard Wickers MRN: [id number] DOB: 1955/06/27 Today's Date: 01/20/2021    History of present illness 65 yo male with sudden onset of R weakness and numnbess. TPA 9/1 NIH 1. MRI pending. PMH: DM diabetic neuropathy HTN HLD CVA with R weakness benign prostatic hyperplasia COPD smoker [place] [facility] s/p 4 stents brain aneurysm CHF   OT comments  Pt performed ADLs with min guard assist.  Reviewed walker safety during ADLs with pt.  Recommend HHOT and intermittent assist at home.    Follow Up Recommendations  Home health OT    Equipment Recommendations  None recommended by OT    Recommendations for Other Services      Precautions / Restrictions Precautions Precautions: Fall Precaution Comments: residual R side weakness from previous stroke       Mobility Bed Mobility                    Transfers                      Balance Overall balance assessment: Needs assistance Sitting-balance support: No upper extremity supported;Feet supported Sitting balance-Leahy Scale: Fair     Standing balance support: No upper extremity supported;During functional activity Standing balance-Leahy Scale: Fair Standing balance comment: able to maintain static standing with min guard assist and no UE support                           ADL either performed or assessed with clinical judgement   ADL Overall ADL's : Needs assistance/impaired             Lower Body Bathing: Min guard;Sit to/from stand       Lower Body Dressing: Min guard;Sit to/from stand   Toilet Transfer: Min guard;Ambulation;Comfort height toilet;BSC;RW           Functional mobility during ADLs: Min guard;Rolling walker General ADL Comments: reviewed walker safet with ADLs and recommended use of walker bag.  Pt provided with reacher     Vision       Perception     Praxis      Cognition Arousal/Alertness:  Awake/alert Behavior During Therapy: WFL for tasks assessed/performed                                   General Comments: Cognition appears to be at baseline        Exercises     Shoulder Instructions       General Comments      Pertinent Vitals/ Pain       Pain Assessment: No/denies pain  Home Living                                          Prior Functioning/Environment              Frequency  Min 2X/week        Progress Toward Goals  OT Goals(current goals can now be found in the care plan section)  Progress towards OT goals: Progressing toward goals     Plan Discharge plan remains appropriate    Co-evaluation                 AM-PAC OT "6 Clicks" Daily  Activity     Outcome Measure   Help from another person eating meals?: A Little Help from another person taking care of personal grooming?: A Little Help from another person toileting, which includes using toliet, bedpan, or urinal?: A Little Help from another person bathing (including washing, rinsing, drying)?: A Little Help from another person to put on and taking off regular upper body clothing?: A Little Help from another person to put on and taking off regular lower body clothing?: A Little 6 Click Score: 18    End of Session Equipment Utilized During Treatment: Rolling walker  OT Visit Diagnosis: Unsteadiness on feet (R26.81);Muscle weakness (generalized) (M62.81)   Activity Tolerance Patient tolerated treatment well   Patient Left in chair;with call bell/phone within reach;with family/visitor present   Nurse Communication Mobility status        Time: [id number] OT Time Calculation (min): 29 min  Charges: OT General Charges $OT Visit: 1 Visit OT Treatments $Self Care/Home Management : 23-37 mins  Nilsa Nutting OTR/L Acute Rehabilitation Services Pager [phone] Office [phone]    Lucille Passy M 01/20/2021, 2:29 PM

## 2021-01-20 NOTE — Discharge Instructions (Addendum)
DISCHARGE INSTRUCTIONS It is important that you quit smoking. We are recommending a 30 day outpatient cardiac monitor to try to determine the cause of your stroke and to prevent future strokes. Please see Dr Bettina Gavia to have this arranged. Home therapies will be arranged. Talk to your primary care provider about helping you get your diabetes under better control. Please try to lose some weight. Your Lipitor dose has been increased

## 2021-01-20 NOTE — Final Progress Note (Signed)
Patient discharged from 4NICU. 2 peripheral IV's removed prior to discharge. AVS packet reviewed with patient and friend. Discharge education completed with no further questions. All belongings sent home with patient.

## 2021-01-20 NOTE — TOC Transition Note (Signed)
Transition of Care [facility]) - CM/SW Discharge Note   Patient Details  Name: Craig Rollins MRN: [id number] Date of Birth: Mar 07, 1956  Transition of Care [facility]) CM/SW Contact:  Bartholomew Crews, RN Phone Number: [phone] 01/20/2021, 2:22 PM   Clinical Narrative:     Spoke with patient and a friend at the bedside to discuss plans for transition home. Agreeable to home health PT/OT - asked about getting aide through Medicaid, discussed [place] SW to assist with this process. PCP in Epic verified. Demographics verified. Offered choice of [place] agency - referral accepted by [facility] for PT, OT, [place] orders provided. Home RW delivered to bedside per referral from CM yesterday. Friend to provide transport home. No further TOC needs identified.   Final next level of care: [place] Barriers to Discharge: No Barriers Identified   Patient Goals and CMS Choice Patient states their goals for this hospitalization and ongoing recovery are:: return home CMS Medicare.gov Compare Post Acute Care list provided to:: Patient Choice offered to / list presented to : Patient  Discharge Placement                       Discharge Plan and Services                DME Arranged: Walker rolling DME Agency: [employer] Date DME Agency Contacted: 01/19/21 Time DME Agency Contacted: [id number] Representative spoke with at DME Agency: [place] Arranged: PT, OT, Social Work [employer] Agency: [place] Date [place]: 01/20/21 Time [place]: 1422 Representative spoke with at [place]: [place] ([place]) Interventions     Readmission Risk Interventions No flowsheet data found.

## 2021-01-20 NOTE — Progress Notes (Signed)
Physical Therapy Treatment Patient Details Name: Craig Rollins MRN: [id number] DOB: [birth year] Today's Date: 01/20/2021    History of Present Illness 65 yo male with sudden onset of R weakness and numnbess. TPA 9/1 NIH 1. MRI pending. PMH: DM diabetic neuropathy HTN HLD CVA with R weakness benign prostatic hyperplasia COPD smoker [place] [facility] s/p 4 stents brain aneurysm CHF    PT Comments    Patient ambulating with RW and supervision. Performed dynamic reaching tasks to simulate tasks needed to perform at home. Educated patient safety concerns and safety with RW, patient verbalized understanding. Continue to recommend HHPT at discharge with intermittent assistance at home.     Follow Up Recommendations  Home health PT     Equipment Recommendations  Rolling Romana Deaton with 5" wheels    Recommendations for Other Services       Precautions / Restrictions Precautions Precautions: Fall Precaution Comments: residual R side weakness from previous stroke Restrictions Weight Bearing Restrictions: No    Mobility  Bed Mobility                    Transfers Overall transfer level: Needs assistance Equipment used: Rolling Moraima Burd (2 wheeled) Transfers: Sit to/from Stand Sit to Stand: Supervision         General transfer comment: supervision for safety. Significantly wide BOS prior to standing  Ambulation/Gait Ambulation/Gait assistance: Supervision Gait Distance (Feet): 200 Feet Assistive device: Rolling Avraj Lindroth (2 wheeled) Gait Pattern/deviations: Step-to pattern;Decreased stride length;Decreased weight shift to right;Decreased weight shift to left;Trunk flexed;Wide base of support Gait velocity: decreased   General Gait Details: supervision for safety. Educated on Jennalyn Cawley safety with close proximity. No LOB noted   Stairs             Wheelchair Mobility    Modified Rankin (Stroke Patients Only)       Balance Overall balance assessment: Needs  assistance Sitting-balance support: No upper extremity supported;Feet supported Sitting balance-Leahy Scale: Fair     Standing balance support: Bilateral upper extremity supported;During functional activity Standing balance-Leahy Scale: Poor Standing balance comment: reliant on UE support                            Cognition Arousal/Alertness: Awake/alert Behavior During Therapy: WFL for tasks assessed/performed                                   General Comments: Cognition appears to be at baseline      Exercises Other Exercises Other Exercises: dynamic reaching tasks in standing    General Comments        Pertinent Vitals/Pain Pain Assessment: No/denies pain    Home Living                      Prior Function            PT Goals (current goals can now be found in the care plan section) Acute Rehab PT Goals Patient Stated Goal: to go home PT Goal Formulation: With patient Time For Goal Achievement: 02/02/21 Potential to Achieve Goals: Fair Progress towards PT goals: Progressing toward goals    Frequency    Min 3X/week      PT Plan Current plan remains appropriate    Co-evaluation              AM-PAC PT "6 Clicks" Mobility  Outcome Measure  Help needed turning from your back to your side while in a flat bed without using bedrails?: A Little Help needed moving from lying on your back to sitting on the side of a flat bed without using bedrails?: A Little Help needed moving to and from a bed to a chair (including a wheelchair)?: A Little Help needed standing up from a chair using your arms (e.g., wheelchair or bedside chair)?: A Little Help needed to walk in hospital room?: A Little Help needed climbing 3-5 steps with a railing? : A Little 6 Click Score: 18    End of Session Equipment Utilized During Treatment: Gait belt Activity Tolerance: Patient tolerated treatment well Patient left: in chair;with call  bell/phone within reach;with nursing/sitter in room;with family/visitor present Nurse Communication: Mobility status PT Visit Diagnosis: Unsteadiness on feet (R26.81);Muscle weakness (generalized) (M62.81);History of falling (Z91.81);Other abnormalities of gait and mobility (R26.89);Dizziness and giddiness (R42)     Time: [id number] PT Time Calculation (min) (ACUTE ONLY): 25 min  Charges:  $Gait Training: 23-37 mins                     Jamielee Mchale A. Gilford Rile PT, DPT Acute Rehabilitation Services Pager [phone] Office [phone]    Linna Hoff 01/20/2021, 3:14 PM

## 2021-01-20 NOTE — Discharge Summary (Addendum)
Patient ID: Craig Rollins   MRN: [id number]      DOB: 1955/12/05  Date of Admission: 01/19/2021 Date of Discharge: 01/20/2021  Attending Physician:  Stroke, Md, MD, Stroke MD Consultant(s):    None  Patient's PCP:  Craig Iba, NP  DISCHARGE DIAGNOSIS: Stroke like syndrome treated with tPA Ongoing tobacco use Hyperlipidemia Diabetes Mellitus - poorly controlled CKD stage II Obesity Hypertension  Active Problems:   Stroke [facility])   Past Medical History:  Diagnosis Date   Aortic stenosis 02/18/2018   Brain aneurysm    CAD in native artery 02/18/2018   Cataract    Chest discomfort 08/13/2018   CHF (congestive heart failure) (HCC)    COPD (chronic obstructive pulmonary disease) (Craig Rollins)    Diabetes (Craig Rollins)    Diabetes mellitus, type 2 (Craig Rollins) 01/26/2018   Diabetic peripheral neuropathy (Craig Rollins) 02/25/2017   Dilated cardiomyopathy (Craig Rollins) 07/16/2016   Ejection fraction 30% based on echo from 2018   Dyslipidemia (high LDL; low HDL) 07/16/2016   Essential hypertension 02/18/2018   Foot swelling 01/26/2018   Hammer toes of both feet 02/25/2017   High cholesterol    Hypercholesteremia 11/23/2019   Myocardial infarction (Craig Rollins)    Noncompliance 07/16/2016   NSTEMI (non-ST elevated myocardial infarction) (Craig Rollins) 08/30/2016   Shortness of breath 08/13/2018   Smoking 07/16/2016   Stroke (Craig Rollins)    TIA (transient ischemic attack)    Unstable angina (Craig Rollins) 03/30/2019   Wound eschar of foot 08/21/2016   Past Surgical History:  Procedure Laterality Date   CORONARY ANGIOPLASTY WITH STENT PLACEMENT     KNEE SURGERY     LEFT HEART CATH AND CORONARY ANGIOGRAPHY N/A 05/05/2020   Procedure: LEFT HEART CATH AND CORONARY ANGIOGRAPHY;  Surgeon: Burnell Blanks, MD;  Location: [place] [facility];  Service: Cardiovascular;  Laterality: N/A;    Family History Family History  Problem Relation Age of Onset   Hypertension Father    Hyperlipidemia Father    Heart disease Father    Heart attack Father     Diabetes Father    Liver cancer Maternal Grandfather    Breast cancer Paternal Grandmother    Heart attack Paternal Grandfather     Social History  reports that he has been smoking. He has a 55.00 pack-year smoking history. He has never used smokeless tobacco. He reports that he does not currently use alcohol. He reports that he does not use drugs.  Allergies as of 01/20/2021       Reactions   Penicillins Nausea And Vomiting   Reaction: Childhood   Milk Protein Itching, Hives   Milk-related Compounds Hives, Itching        Medication List     TAKE these medications    albuterol 108 (90 Base) MCG/ACT inhaler Commonly known as: VENTOLIN HFA Inhale 2 puffs into the lungs every 6 (six) hours as needed for wheezing or shortness of breath.   aspirin EC 81 MG tablet Take 81 mg by mouth daily. Swallow whole.   atorvastatin 80 MG tablet Commonly known as: LIPITOR Take 1 tablet (80 mg total) by mouth daily. Start taking on: January 21, 2021 What changed:  medication strength how much to take   carvedilol 3.125 MG tablet Commonly known as: COREG Take 6.25 mg by mouth every 12 (twelve) hours.   clopidogrel 75 MG tablet Commonly known as: PLAVIX TAKE 1 TABLET BY MOUTH DAILY   dapagliflozin propanediol 10 MG Tabs tablet Commonly known as: [place]  Take 1 tablet (10 mg total) by mouth daily before breakfast.   diphenhydrAMINE 25 MG tablet Commonly known as: BENADRYL Take 25 mg by mouth 2 (two) times daily.   finasteride 5 MG tablet Commonly known as: PROSCAR Take 5 mg by mouth daily.   furosemide 40 MG tablet Commonly known as: LASIX Take 40 mg by mouth daily.   glipiZIDE 5 MG tablet Commonly known as: GLUCOTROL Take 2.5 mg by mouth 2 (two) times daily before a meal.   JUICE PLUS FIBRE PO Take 8 capsules by mouth See admin instructions. Vegetable 4 capsule  and fruit 4 capsule daily   lisinopril 5 MG tablet Commonly known as: ZESTRIL Take 5 mg by mouth daily.    metFORMIN 500 MG tablet Commonly known as: GLUCOPHAGE Take 500 mg by mouth 2 (two) times daily with a meal.   nitroGLYCERIN 0.4 MG SL tablet Commonly known as: NITROSTAT Place 0.4 mg under the tongue every 5 (five) minutes as needed for chest pain.   tamsulosin 0.4 MG Caps capsule Commonly known as: FLOMAX Take 0.4 mg by mouth daily.   topiramate 50 MG tablet Commonly known as: Topamax Take 1 tablet (50 mg total) by mouth daily.   Tyler Aas FlexTouch 100 UNIT/ML FlexTouch Pen Generic drug: insulin degludec Inject 12 Units into the skin at bedtime.   triamcinolone cream 0.1 % Commonly known as: KENALOG Apply 1 application topically 2 (two) times daily.   varenicline 0.5 MG X 11 & 1 MG X 42 tablet Commonly known as: Chantix Starting Month Pak Take one 0.5 mg tablet by mouth once daily for 3 days, then increase to one 0.5 mg tablet twice daily for 4 days, then increase to one 1 mg tablet twice daily.               Durable Medical Equipment  (From admission, onward)           Start     Ordered   01/19/21 1526  For home use only DME Walker rolling  Once       Question Answer Comment  Walker: With 5 Inch Wheels   Patient needs a walker to treat with the following condition Weakness      01/19/21 1527            Davy Medications Prior to Admission  Medication Sig Dispense Refill   lisinopril (ZESTRIL) 5 MG tablet Take 5 mg by mouth daily.     metFORMIN (GLUCOPHAGE) 500 MG tablet Take 500 mg by mouth 2 (two) times daily with a meal.     tamsulosin (FLOMAX) 0.4 MG CAPS capsule Take 0.4 mg by mouth daily.     topiramate (TOPAMAX) 50 MG tablet Take 1 tablet (50 mg total) by mouth daily. 30 tablet 3   albuterol (VENTOLIN HFA) 108 (90 Base) MCG/ACT inhaler Inhale 2 puffs into the lungs every 6 (six) hours as needed for wheezing or shortness of breath.     aspirin EC 81 MG tablet Take 81 mg by mouth daily. Swallow whole.      atorvastatin (LIPITOR) 40 MG tablet Take 40 mg by mouth daily.     carvedilol (COREG) 3.125 MG tablet Take 6.25 mg by mouth every 12 (twelve) hours.     clopidogrel (PLAVIX) 75 MG tablet TAKE 1 TABLET BY MOUTH DAILY 90 tablet 2   dapagliflozin propanediol (FARXIGA) 10 MG TABS tablet Take 1 tablet (10 mg total) by mouth daily before breakfast. 90 tablet  3   diphenhydrAMINE (BENADRYL) 25 MG tablet Take 25 mg by mouth 2 (two) times daily.     finasteride (PROSCAR) 5 MG tablet Take 5 mg by mouth daily.     furosemide (LASIX) 40 MG tablet Take 40 mg by mouth daily.     glipiZIDE (GLUCOTROL) 5 MG tablet Take 2.5 mg by mouth 2 (two) times daily before a meal.     nitroGLYCERIN (NITROSTAT) 0.4 MG SL tablet Place 0.4 mg under the tongue every 5 (five) minutes as needed for chest pain.     Nutritional Supplements (JUICE PLUS FIBRE PO) Take 8 capsules by mouth See admin instructions. Vegetable 4 capsule  and fruit 4 capsule daily     TRESIBA FLEXTOUCH 100 UNIT/ML FlexTouch Pen Inject 12 Units into the skin at bedtime.     triamcinolone cream (KENALOG) 0.1 % Apply 1 application topically 2 (two) times daily.     varenicline (CHANTIX STARTING MONTH PAK) 0.5 MG X 11 & 1 MG X 42 tablet Take one 0.5 mg tablet by mouth once daily for 3 days, then increase to one 0.5 mg tablet twice daily for 4 days, then increase to one 1 mg tablet twice daily. 53 tablet 0     HOSPITAL MEDICATIONS  aspirin  81 mg Oral Daily   atorvastatin  80 mg Oral Daily   Chlorhexidine Gluconate Cloth  6 each Topical Daily   clopidogrel  75 mg Oral Daily   insulin aspart  0-15 Units Subcutaneous TID WC   insulin aspart  0-5 Units Subcutaneous QHS   lidocaine  1 patch Transdermal Q24H   pantoprazole (PROTONIX) IV  40 mg Intravenous QHS   tamsulosin  0.4 mg Oral Daily    LABORATORY STUDIES CBC    Component Value Date/Time   WBC 9.4 01/19/2021 0523   RBC 4.78 01/19/2021 0523   HGB 13.7 01/19/2021 0523   HGB 14.1 04/28/2020 1122    HCT 43.5 01/19/2021 0523   HCT 44.0 04/28/2020 1122   PLT 234 01/19/2021 0523   PLT 232 04/28/2020 1122   MCV 91.0 01/19/2021 0523   MCV 92 04/28/2020 1122   MCH 28.7 01/19/2021 0523   MCHC 31.5 01/19/2021 0523   RDW 14.8 01/19/2021 0523   RDW 13.7 04/28/2020 1122   CMP    Component Value Date/Time   NA 136 01/19/2021 0523   NA 144 04/28/2020 1122   K 3.8 01/19/2021 0523   CL 105 01/19/2021 0523   CO2 24 01/19/2021 0523   GLUCOSE 127 (H) 01/19/2021 0523   BUN 13 01/19/2021 0523   BUN 22 04/28/2020 1122   CREATININE 1.32 (H) 01/19/2021 0523   CALCIUM 9.1 01/19/2021 0523   PROT 6.2 (L) 01/19/2021 0523   PROT 6.7 04/01/2018 1619   ALBUMIN 3.0 (L) 01/19/2021 0523   ALBUMIN 3.9 04/01/2018 1619   AST 18 01/19/2021 0523   ALT 13 01/19/2021 0523   ALKPHOS 87 01/19/2021 0523   BILITOT 0.6 01/19/2021 0523   BILITOT 0.3 04/01/2018 1619   GFRNONAA >60 01/19/2021 0523   GFRAA 66 04/28/2020 1122   COAGSNo results found for: INR, PROTIME Lipid Panel    Component Value Date/Time   CHOL 130 01/19/2021 0523   CHOL 107 04/01/2018 1619   TRIG 111 01/19/2021 0523   HDL 23 (L) 01/19/2021 0523   HDL 32 (L) 04/01/2018 1619   CHOLHDL 5.7 01/19/2021 0523   VLDL 22 01/19/2021 0523   LDLCALC 85 01/19/2021 0523   LDLCALC 52  04/01/2018 1619   HgbA1C  Lab Results  Component Value Date   HGBA1C 7.4 (H) 01/19/2021   Urinalysis No results found for: COLORURINE, APPEARANCEUR, LABSPEC, PHURINE, GLUCOSEU, HGBUR, BILIRUBINUR, KETONESUR, PROTEINUR, UROBILINOGEN, NITRITE, LEUKOCYTESUR Urine Drug Screen     Component Value Date/Time   LABOPIA NONE DETECTED 01/20/2021 1012   COCAINSCRNUR NONE DETECTED 01/20/2021 1012   LABBENZ POSITIVE (A) 01/20/2021 1012   AMPHETMU NONE DETECTED 01/20/2021 1012   THCU NONE DETECTED 01/20/2021 1012   LABBARB NONE DETECTED 01/20/2021 1012    Alcohol Level No results found for: ETH   SIGNIFICANT DIAGNOSTIC STUDIES MR BRAIN WO CONTRAST  Result Date:  01/19/2021 CLINICAL DATA:  Provided history: Neuro deficit, acute, stroke suspected; tPA administered at [facility], this is 24 hours post tPA scan. EXAM: MRI HEAD WITHOUT CONTRAST TECHNIQUE: Multiplanar, multiecho pulse sequences of the brain and surrounding structures were obtained without intravenous contrast. COMPARISON:  Noncontrast head CT and CT angiogram head/neck 01/18/2021. Brain MRI 04/17/2019. FINDINGS: Brain: Intermittently motion degraded examination, limiting evaluation. Most notably, there is moderate/severe motion degradation of the sagittal T1 weighted sequence, moderate/severe motion degradation of the axial T2 TSE sequence, moderate motion degradation of the axial T1 weighted sequence and moderate/severe motion degradation of the coronal T2 TSE sequence. Mild generalized cerebral and cerebellar atrophy. Redemonstrated chronic lacunar infarcts within the deep gray nuclei bilaterally. Moderate multifocal T2/FLAIR hyperintensity within the cerebral white matter, nonspecific but compatible chronic small vessel ischemic disease. These signal changes have progressed as compared to the brain MRI of 04/17/2019. The diffusion-weighted imaging is of good quality. There is no acute infarct. No evidence of an intracranial mass. No chronic intracranial blood products. No extra-axial fluid collection. No midline shift. Vascular: Maintained flow voids within the proximal large arterial vessels. Skull and upper cervical spine: No focal suspicious marrow lesion. Sinuses/Orbits: Visualized orbits show no acute finding. Mucosal thickening and frothy secretions within the bilateral ethmoid and left sphenoid sinuses. Mild mucosal thickening within the bilateral maxillary sinuses at the imaged levels. IMPRESSION: Motion degraded examination, as described and limiting evaluation. The diffusion-weighted imaging is of good quality. No evidence of acute infarction. No acute intracranial abnormality is identified.  Redemonstrated chronic lacunar infarcts within the bilateral deep gray nuclei. Moderate chronic small-vessel ischemic changes within the cerebral white matter, progressed from the brain MRI of 04/17/2019. Mild generalized parenchymal atrophy. Paranasal sinus disease, as described. Electronically Signed   By: Kellie Simmering D.O.   On: 01/19/2021 17:46   ECHOCARDIOGRAM COMPLETE  Result Date: 01/19/2021    ECHOCARDIOGRAM REPORT   Patient Name:   HEZAKIAH PALADINO Date of Exam: 01/19/2021 Medical Rec #:  [id number]            Height:       76.0 in Accession #:    [id number]           Weight:       296.8 lb Date of Birth:  [birth year]            BSA:          2.620 m Patient Age:    65 years             BP:           119/72 mmHg Patient Gender: M                    HR:           65 bpm. Exam Location:  Inpatient Procedure:  2D Echo and Intracardiac Opacification Agent Indications:    stroke  History:        Patient has prior history of Echocardiogram examinations, most                 recent 03/20/2020. CAD, COPD; Risk Factors:Current Smoker,                 Diabetes and Hypertension.  Sonographer:    [place] Referring Phys: [id number] [place]  1. Left ventricular ejection fraction, by estimation, is 45 to 50%. The left ventricle has mildly decreased function. The left ventricle demonstrates global hypokinesis. There is mild concentric left ventricular hypertrophy. Left ventricular diastolic parameters are consistent with Grade I diastolic dysfunction (impaired relaxation).  2. Right ventricular systolic function is normal. The right ventricular size is normal. Tricuspid regurgitation signal is inadequate for assessing PA pressure.  3. The mitral valve is degenerative. No evidence of mitral valve regurgitation. No evidence of mitral stenosis.  4. The aortic valve is calcified. Aortic valve regurgitation is not visualized. Mild to moderate aortic valve stenosis. Aortic valve mean gradient  measures 20.0 mmHg. Aortic valve Vmax measures 2.99 m/s. LVSVI is diminished at 19  5. The inferior vena cava is normal in size with greater than 50% respiratory variability, suggesting right atrial pressure of 3 mmHg. Comparison(s): A prior study was performed on 03/20/2020. Simlar LV function, study may underestimate severity of valve disease. Conclusion(s)/Recommendation(s): No intracardiac source of embolism detected on this transthoracic study. A transesophageal echocardiogram is recommended to exclude cardiac source of embolism if clinically indicated. FINDINGS  Left Ventricle: Left ventricular ejection fraction, by estimation, is 45 to 50%. The left ventricle has mildly decreased function. The left ventricle demonstrates global hypokinesis. Definity contrast agent was given IV to delineate the left ventricular  endocardial borders. The left ventricular internal cavity size was normal in size. There is mild concentric left ventricular hypertrophy. Left ventricular diastolic parameters are consistent with Grade I diastolic dysfunction (impaired relaxation). Right Ventricle: The right ventricular size is normal. No increase in right ventricular wall thickness. Right ventricular systolic function is normal. Tricuspid regurgitation signal is inadequate for assessing PA pressure. Left Atrium: Left atrial size was normal in size. Right Atrium: Right atrial size was normal in size. Pericardium: There is no evidence of pericardial effusion. Presence of pericardial fat pad. Mitral Valve: The mitral valve is degenerative in appearance. No evidence of mitral valve regurgitation. No evidence of mitral valve stenosis. Tricuspid Valve: The tricuspid valve is normal in structure. Tricuspid valve regurgitation is not demonstrated. No evidence of tricuspid stenosis. Aortic Valve: The aortic valve is calcified. Aortic valve regurgitation is not visualized. Mild to moderate aortic stenosis is present. Aortic valve mean gradient  measures 20.0 mmHg. Aortic valve peak gradient measures 35.8 mmHg. Aortic valve area, by VTI measures 0.99 cm. Pulmonic Valve: The pulmonic valve was not well visualized. Pulmonic valve regurgitation is not visualized. No evidence of pulmonic stenosis. Aorta: The aortic root and ascending aorta are structurally normal, with no evidence of dilitation. Venous: The inferior vena cava is normal in size with greater than 50% respiratory variability, suggesting right atrial pressure of 3 mmHg. IAS/Shunts: The atrial septum is grossly normal.  LEFT VENTRICLE PLAX 2D LVIDd:         4.90 cm  Diastology LVIDs:         3.30 cm  LV e' medial:    5.33 cm/s LV PW:  1.20 cm  LV E/e' medial:  13.0 LV IVS:        1.30 cm  LV e' lateral:   8.16 cm/s LVOT diam:     1.90 cm  LV E/e' lateral: 8.5 LV SV:         51 LV SV Index:   19 LVOT Area:     2.84 cm  RIGHT VENTRICLE             IVC RV S prime:     12.30 cm/s  IVC diam: 2.20 cm TAPSE (M-mode): 2.2 cm LEFT ATRIUM             Index       RIGHT ATRIUM           Index LA diam:        3.60 cm 1.37 cm/m  RA Area:     14.40 cm LA Vol (A2C):   43.3 ml 16.53 ml/m RA Volume:   36.30 ml  13.86 ml/m LA Vol (A4C):   45.2 ml 17.25 ml/m LA Biplane Vol: 46.5 ml 17.75 ml/m  AORTIC VALVE AV Area (Vmax):    0.83 cm AV Area (Vmean):   0.84 cm AV Area (VTI):     0.99 cm AV Vmax:           299.00 cm/s AV Vmean:          188.500 cm/s AV VTI:            0.518 m AV Peak Grad:      35.8 mmHg AV Mean Grad:      20.0 mmHg LVOT Vmax:         87.20 cm/s LVOT Vmean:        55.550 cm/s LVOT VTI:          0.180 m LVOT/AV VTI ratio: 0.35  AORTA Ao Root diam: 3.40 cm Ao Asc diam:  3.50 cm MITRAL VALVE MV Area (PHT): 3.37 cm    SHUNTS MV Decel Time: 225 msec    Systemic VTI:  0.18 m MV E velocity: 69.30 cm/s  Systemic Diam: 1.90 cm MV A velocity: 76.00 cm/s MV E/A ratio:  0.91 Rudean Haskell MD Electronically signed by Rudean Haskell MD Signature Date/Time: 01/19/2021/3:44:56 PM    Final         HISTORY OF PRESENT ILLNESS (From 01/19/21 H&P - Donnetta Simpers, MD)  Nachum Bing Mase is a 65 y.o. male with PMH significant for diabetes complicated by diabetic neuropathy, hypertension, hyperlipidemia, prior stroke with residual right-sided weakness, benign prostatic hyperplasia, dilated cardiomyopathy, COPD and smoker, history history of MI and status post 4 stents who presented to [facility] emergency department with sudden onset right-sided weakness and numbness. Patient reports that he was at a band practice when he had sudden onset numbness and weakness of his entire right side that started around 1845 on 01/18/2021.  He reports that his baseline he has mild right-sided numbness in his right face and his right arm.  However, yesterday he had numbness involving the entire right side including his right leg and was very difficult to control his right arm and his right leg.  He was evaluated in the emergency department by teleneurology and he was given tPA at [place] on 01/18/2021.  He reports that shortly after administration of the tPA, his symptoms are completely resolved. Work-up at [facility] emergency department with CT head without contrast which I personally reviewed with no acute intracranial abnormality, no ICH, no  large territory stroke.  CT angio head and neck was also completed with multifocal multivessel stenosis with no significant large vessel occlusion. He reports that he continues to smoke despite being told by multiple providers to quit smoking.  Smokes about 1 pack a day and has been doing this for several decades.  He does not use any recreational drugs.  He does not drink alcohol. He lives by himself and is able to do almost all activities of daily living with some assistance from his friend for groceries. MR S: 0 tPA: Given at outside hospital at [facility] on 01/18/2021. Thrombectomy: Not offered due to no LVO.            NIHSS components Score: Comment  1a Level  of Conscious 0'[x]'$  1'[]'$  2'[]'$  3'[]'$         1b LOC Questions 0'[x]'$  1'[]'$  2'[]'$           1c LOC Commands 0'[x]'$  1'[]'$  2'[]'$           2 Best Gaze 0'[x]'$  1'[]'$  2'[]'$           3 Visual 0'[x]'$  1'[]'$  2'[]'$  3'[]'$         4 Facial Palsy 0'[x]'$  1'[]'$  2'[]'$  3'[]'$         5a Motor Arm - left 0'[x]'$  1'[]'$  2'[]'$  3'[]'$  4'[]'$  UN'[]'$     5b Motor Arm - Right 0'[x]'$  1'[]'$  2'[]'$  3'[]'$  4'[]'$  UN'[]'$     6a Motor Leg - Left 0'[x]'$  1'[]'$  2'[]'$  3'[]'$  4'[]'$  UN'[]'$     6b Motor Leg - Right 0'[x]'$  1'[]'$  2'[]'$  3'[]'$  4'[]'$  UN'[]'$     7 Limb Ataxia 0'[x]'$  1'[]'$  2'[]'$  3'[]'$  UN'[]'$       8 Sensory 0'[]'$  1'[x]'$  2'[]'$  UN'[]'$         9 Best Language 0'[x]'$  1'[]'$  2'[]'$  3'[]'$         10 Dysarthria 0'[x]'$  1'[]'$  2'[]'$  UN'[]'$         11 Extinct. and Inattention 0'[x]'$  1'[]'$  2'[]'$           TOTAL: 1           HOSPITAL COURSE Mr. Zedekiah Kroenke is a 65 y.o. male with history of diabetes complicated by diabetic neuropathy, hypertension, hyperlipidemia, prior stroke with residual right-sided weakness, benign prostatic hyperplasia, dilated cardiomyopathy, COPD and smoker, history history of MI and status post 4 stents who presented to [facility] emergency department with sudden onset right-sided weakness and numbness. He was evaluated in the emergency department by teleneurology and he was given tPA at [place] on 01/18/2021.  He reports that shortly after administration of the tPA, his symptoms are completely resolved.  Stroke symptoms s/p tPA - left brain TIA secondary to  small vessel disease versus exacerbation of old deficits. Of course, cardioembolic source cannot be completely ruled out at this time Code Stroke  CT head No acute abnormality.  ASPECTS 10.     CTA head & neck: Aortic atherosclerosis, no LVO  MRI - No evidence of acute infarction. No acute intracranial abnormality is identified. Re-demonstrated chronic lacunar infarcts within the bilateral deep gray nuclei.  2D Echo - EF 45 to 50%  Recommend 30-day CardioNet monitoring with patient cardiologist Dr. Bettina Gavia LDL 85 HgbA1c 7.4 VTE prophylaxis - scd aspirin 81 mg daily and clopidogrel 75 mg  daily prior to admission, continue aspirin 81 mg daily and plavix 75 DAPT on discharge. Therapy recommendations:  [facility] PT and OT recommended Disposition: Home today  History of stroke 02/2018 admitted for right-sided weakness numbness, right facial droop and slurred speech.  MRI showed  left thalamic infarct.  CT head and neck negative, TTE normal EF.  Discharged with DAPT.  Hypertension Home meds:  lisinopril, coreg Stable Long-term BP goal normotensive   Hyperlipidemia Home meds:  Lipitor '40mg'$  LDL 85, goal < 70 Lipitor increased to 80 mg daily Continue statin at discharge   Diabetes type II Uncontrolled Home meds:  metformin, farxiga, glucotrol HgbA1c 7.4, goal < 7.0 CBGs SSI Close PCP follow-up for better DM control  Tobacco abuse Current smoker, 1 PPD Smoking cessation counseling provided Pt is willing to quit   Other Stroke Risk Factors Cardiomyopathy Obesity,  BMI >/= 30 associated with increased stroke risk, recommend weight loss, diet and exercise as appropriate  Coronary artery disease/MI status post stent   Other Active Problems, Findings, Recommendations and/or Plan  CKD - stage II - creatinine - 1.32   DISCHARGE EXAM Vitals:   01/20/21 0800 01/20/21 0900 01/20/21 1000 01/20/21 1200  BP: (!) 145/77 137/69 (!) 143/75   Pulse: 71 73 80   Resp: '14 13 14   '$ Temp: 98.3 F (36.8 C)   98.8 F (37.1 C)  TempSrc: Oral   Oral  SpO2: 93% 95% 95%    PHYSICAL EXAM:  HENT: Normal oropharynx and mucosa. Normal external appearance of ears and nose.  Neck: Supple, no pain or tenderness  CV: No JVD. No peripheral edema.  Pulmonary: Symmetric Chest rise. Normal respiratory effort.  Abdomen: Soft to touch, non-tender.  Ext: No cyanosis, edema, or deformity  Skin: Redness and rahs on BL feet(reports that this is from eczema) Normal palpation of skin.   Musculoskeletal: Normal digits and nails by inspection. No clubbing.    Mental status/Cognition: Alert, oriented to  self, place, month and year, good attention.  Speech/language: Fluent, comprehension intact, object naming intact, repetition intact.  Cranial nerves:   CN II Pupils equal and reactive to light, no VF deficits    CN III,IV,VI EOM intact, no gaze preference or deviation, no nystagmus    CN V normal sensation in V1, V2, and V3 segments bilaterally    CN VII no asymmetry, mild right nasolabial fold flattening    CN VIII normal hearing to speech    CN IX & X normal palatal elevation, no uvular deviation    CN XI 5/5 head turn and 5/5 shoulder shrug bilaterally    CN XII midline tongue protrusion     Decreased muscle bulk with normal tone. No pronator drift noted.  Strength 4/5 on right, chronic. And 4+-5/5 on left.  Bilateral mild foot drop and ankle dorsiflexor weakness. Decreased sensation at right face and right arm/ this is baseline, per patient.  Finger to nose intact. Able to stand temporarily on toes and heels while holding onto walker.   DISCHARGE INSTRUCTIONS GIVEN TO THE PATIENT It is important that you quit smoking. We are recommending a 30 day outpatient cardiac monitor to try to determine the cause of your stroke and to prevent future strokes. Please see Dr Bettina Gavia to have this arranged. Home therapies will be arranged. Talk to your primary care provider about helping you get your diabetes under better control. Please try to lose some weight. Your Lipitor dose has been increased   DISCHARGE DIET   Diet Order             Diet Carb Modified Fluid consistency: Thin; Room service appropriate? Yes with Assist  Diet effective now  liquids  DISCHARGE PLAN Disposition:  Discharge to home aspirin 81 mg daily and clopidogrel 75 mg daily for secondary stroke prevention. Follow-up Craig Iba, NP in 2 weeks. Ongoing risk factor control by Primary Care Provider at time of discharge Follow-up in [facility] in 4 weeks, office to  schedule an appointment.  Dr Bettina Gavia - in 2 weeks - 30 day outpatient cardiac monitor recommended. Home Health Physical and Occupational therapy  40 minutes were spent preparing discharge.  Rosalin Hawking, MD PhD Stroke Neurology 01/20/2021 5:53 PM

## 2021-01-23 ENCOUNTER — Ambulatory Visit: Payer: Medicare HMO

## 2021-01-23 ENCOUNTER — Other Ambulatory Visit: Payer: Self-pay

## 2021-01-23 DIAGNOSIS — I639 Cerebral infarction, unspecified: Secondary | ICD-10-CM

## 2021-01-23 NOTE — Progress Notes (Signed)
Patient scheduled for a nurse visit to get a live zio placed for 14 days tomorrow 01/23/2021.

## 2021-01-24 ENCOUNTER — Other Ambulatory Visit: Payer: Self-pay

## 2021-01-24 ENCOUNTER — Ambulatory Visit (INDEPENDENT_AMBULATORY_CARE_PROVIDER_SITE_OTHER): Payer: Medicare HMO

## 2021-01-24 DIAGNOSIS — I639 Cerebral infarction, unspecified: Secondary | ICD-10-CM | POA: Diagnosis not present

## 2021-01-24 DIAGNOSIS — I428 Other cardiomyopathies: Secondary | ICD-10-CM | POA: Diagnosis not present

## 2021-03-27 ENCOUNTER — Other Ambulatory Visit: Payer: Self-pay

## 2021-03-27 ENCOUNTER — Encounter: Payer: Self-pay | Admitting: Sports Medicine

## 2021-03-27 ENCOUNTER — Ambulatory Visit (INDEPENDENT_AMBULATORY_CARE_PROVIDER_SITE_OTHER): Payer: Medicare HMO | Admitting: Sports Medicine

## 2021-03-27 DIAGNOSIS — L853 Xerosis cutis: Secondary | ICD-10-CM

## 2021-03-27 DIAGNOSIS — B351 Tinea unguium: Secondary | ICD-10-CM | POA: Diagnosis not present

## 2021-03-27 DIAGNOSIS — E1142 Type 2 diabetes mellitus with diabetic polyneuropathy: Secondary | ICD-10-CM

## 2021-03-27 DIAGNOSIS — M79674 Pain in right toe(s): Secondary | ICD-10-CM

## 2021-03-27 DIAGNOSIS — M79675 Pain in left toe(s): Secondary | ICD-10-CM

## 2021-03-27 DIAGNOSIS — Z899 Acquired absence of limb, unspecified: Secondary | ICD-10-CM

## 2021-03-27 NOTE — Progress Notes (Signed)
Subjective: Craig Rollins is a 65 y.o. male patient with history of diabetes who presents to office today complaining of long,mildly painful nails  while ambulating in shoes; unable to trim.  Last PCP visit was 3 weeks ago, [name] like before and states that about 3 months ago he had a stroke and was hospitalized for couple days now currently in physical therapy FBS this am 129 A1C 7.2 .  Patient Active Problem List   Diagnosis Date Noted   Chest pain of uncertain etiology    Hypertensive heart disease 04/27/2020   TIA (transient ischemic attack)    Myocardial infarction ([place])    High cholesterol    Diabetes ([place])    Cataract    Brain aneurysm    Hypercholesteremia 11/23/2019   Unstable angina ([place]) 03/30/2019   Chest discomfort 08/13/2018   CHF (congestive heart failure) ([place]) 08/13/2018   COPD (chronic obstructive pulmonary disease) ([place]) 08/13/2018   Open wound 08/13/2018   Shortness of breath 08/13/2018   CAD in native artery 02/18/2018   Aortic stenosis 02/18/2018   Essential hypertension 02/18/2018   Foot swelling 01/26/2018   Diabetes mellitus, type 2 ([place]) 01/26/2018   Diabetic peripheral neuropathy ([place]) 02/25/2017   Hammer toes of both feet 02/25/2017   Risk for falls 09/23/2016   NSTEMI (non-ST elevated myocardial infarction) ([place]) 08/30/2016   Wound eschar of foot 08/21/2016   Dilated cardiomyopathy ([place]) 07/16/2016   Dyslipidemia (high LDL; low HDL) 07/16/2016   Noncompliance 07/16/2016   Smoking 07/16/2016   Stroke ([place]) 12/19/2015   Current Outpatient Medications on File Prior to Visit  Medication Sig Dispense Refill   albuterol (VENTOLIN HFA) 108 (90 Base) MCG/ACT inhaler Inhale 2 puffs into the lungs every 6 (six) hours as needed for wheezing or shortness of breath.     aspirin EC 81 MG tablet Take 81 mg by mouth daily. Swallow whole.     atorvastatin (LIPITOR) 80 MG tablet Take 1 tablet (80 mg total) by mouth daily. 30 tablet 2   carvedilol  (COREG) 3.125 MG tablet Take 6.25 mg by mouth every 12 (twelve) hours.     clopidogrel (PLAVIX) 75 MG tablet TAKE 1 TABLET BY MOUTH DAILY 90 tablet 2   dapagliflozin propanediol (FARXIGA) 10 MG TABS tablet Take 1 tablet (10 mg total) by mouth daily before breakfast. 90 tablet 3   diphenhydrAMINE (BENADRYL) 25 MG tablet Take 25 mg by mouth 2 (two) times daily.     finasteride (PROSCAR) 5 MG tablet Take 5 mg by mouth daily.     furosemide (LASIX) 40 MG tablet Take 40 mg by mouth daily.     glipiZIDE (GLUCOTROL) 5 MG tablet Take 2.5 mg by mouth 2 (two) times daily before a meal.     lisinopril (ZESTRIL) 5 MG tablet Take 5 mg by mouth daily.     metFORMIN (GLUCOPHAGE) 500 MG tablet Take 500 mg by mouth 2 (two) times daily with a meal.     nitroGLYCERIN (NITROSTAT) 0.4 MG SL tablet Place 0.4 mg under the tongue every 5 (five) minutes as needed for chest pain.     Nutritional Supplements (JUICE PLUS FIBRE PO) Take 8 capsules by mouth See admin instructions. Vegetable 4 capsule  and fruit 4 capsule daily     tamsulosin (FLOMAX) 0.4 MG CAPS capsule Take 0.4 mg by mouth daily.     topiramate (TOPAMAX) 50 MG tablet Take 1 tablet (50 mg total) by mouth daily. 30 tablet 3   TRESIBA  FLEXTOUCH 100 UNIT/ML FlexTouch Pen Inject 12 Units into the skin at bedtime.     triamcinolone cream (KENALOG) 0.1 % Apply 1 application topically 2 (two) times daily.     varenicline (CHANTIX STARTING MONTH PAK) 0.5 MG X 11 & 1 MG X 42 tablet Take one 0.5 mg tablet by mouth once daily for 3 days, then increase to one 0.5 mg tablet twice daily for 4 days, then increase to one 1 mg tablet twice daily. 53 tablet 0   No current facility-administered medications on file prior to visit.   Allergies  Allergen Reactions   Penicillins Nausea And Vomiting    Reaction: Childhood   Milk Protein Itching and Hives   Milk-Related Compounds Hives and Itching    Recent Results (from the past 2160 hour(s))  MRSA Next Gen by PCR, Nasal      Status: None   Collection Time: 01/19/21  5:21 AM   Specimen: Nasal Mucosa; Nasal Swab  Result Value Ref Range   MRSA by PCR Next Gen NOT DETECTED NOT DETECTED    Comment: (NOTE) The GeneXpert MRSA Assay (FDA approved for NASAL specimens only), is one component of a comprehensive MRSA colonization surveillance program. It is not intended to diagnose MRSA infection nor to guide or monitor treatment for MRSA infections. Test performance is not FDA approved in patients less than 45 years old. Performed at [facility], [place] [address], [place], [place] [address]   HIV Antibody (routine testing w rflx)     Status: None   Collection Time: 01/19/21  5:23 AM  Result Value Ref Range   HIV Screen 4th Generation wRfx Non Reactive Non Reactive    Comment: Performed at [facility], [place] [address], [place], [place] [address]  Hemoglobin A1c     Status: Abnormal   Collection Time: 01/19/21  5:23 AM  Result Value Ref Range   Hgb A1c MFr Bld 7.4 (H) 4.8 - 5.6 %    Comment: (NOTE) Pre diabetes:          5.7%-6.4%  Diabetes:              >6.4%  Glycemic control for   <7.0% adults with diabetes    Mean Plasma Glucose 165.68 mg/dL    Comment: Performed at [place] [address], [place], [place] [address]  Lipid panel     Status: Abnormal   Collection Time: 01/19/21  5:23 AM  Result Value Ref Range   Cholesterol 130 0 - 200 mg/dL   Triglycerides 111 <150 mg/dL   HDL 23 (L) >40 mg/dL   Total CHOL/HDL Ratio 5.7 RATIO   VLDL 22 0 - 40 mg/dL   LDL Cholesterol 85 0 - 99 mg/dL    Comment:        Total Cholesterol/HDL:CHD Risk Coronary Heart Disease Risk Table                     Men   Women  1/2 Average Risk   3.4   3.3  Average Risk       5.0   4.4  2 X Average Risk   9.6   7.1  3 X Average Risk  23.4   11.0        Use the calculated Patient Ratio above and the CHD Risk Table to determine the patient's CHD Risk.        ATP III CLASSIFICATION (LDL):  <100  mg/dL   Optimal  100-129  mg/dL   Near or Above                    Optimal  130-159  mg/dL   Borderline  160-189  mg/dL   High  >190     mg/dL   Very High Performed at [facility] [address] [place] [address]   CBC     Status: None   Collection Time: 01/19/21  5:23 AM  Result Value Ref Range   WBC 9.4 4.0 - 10.5 K/uL   RBC 4.78 4.22 - 5.81 MIL/uL   Hemoglobin 13.7 13.0 - 17.0 g/dL   HCT 43.5 39.0 - 52.0 %   MCV 91.0 80.0 - 100.0 fL   MCH 28.7 26.0 - 34.0 pg   MCHC 31.5 30.0 - 36.0 g/dL   RDW 14.8 11.5 - 15.5 %   Platelets 234 150 - 400 K/uL   nRBC 0.0 0.0 - 0.2 %    Comment: Performed at [facility], [place] [address], [place], [place] [address]  Comprehensive metabolic panel     Status: Abnormal   Collection Time: 01/19/21  5:23 AM  Result Value Ref Range   Sodium 136 135 - 145 mmol/L   Potassium 3.8 3.5 - 5.1 mmol/L   Chloride 105 98 - 111 mmol/L   CO2 24 22 - 32 mmol/L   Glucose, Bld 127 (H) 70 - 99 mg/dL    Comment: Glucose reference range applies only to samples taken after fasting for at least 8 hours.   BUN 13 8 - 23 mg/dL   Creatinine, Ser 1.32 (H) 0.61 - 1.24 mg/dL   Calcium 9.1 8.9 - 10.3 mg/dL   Total Protein 6.2 (L) 6.5 - 8.1 g/dL   Albumin 3.0 (L) 3.5 - 5.0 g/dL   AST 18 15 - 41 U/L   ALT 13 0 - 44 U/L   Alkaline Phosphatase 87 38 - 126 U/L   Total Bilirubin 0.6 0.3 - 1.2 mg/dL   GFR, Estimated >60 >60 mL/min    Comment: (NOTE) Calculated using the CKD-EPI Creatinine Equation (2021)    Anion gap 7 5 - 15    Comment: Performed at [facility], [place] [address], [place], [place] [address]  Glucose, capillary     Status: Abnormal   Collection Time: 01/19/21  8:04 AM  Result Value Ref Range   Glucose-Capillary 123 (H) 70 - 99 mg/dL    Comment: Glucose reference range applies only to samples taken after fasting for at least 8 hours.  Glucose, capillary     Status: Abnormal   Collection Time: 01/19/21 11:25 AM  Result Value Ref  Range   Glucose-Capillary 207 (H) 70 - 99 mg/dL    Comment: Glucose reference range applies only to samples taken after fasting for at least 8 hours.  ECHOCARDIOGRAM COMPLETE     Status: None   Collection Time: 01/19/21  2:31 PM  Result Value Ref Range   BP 120/63 mmHg   S' Lateral 3.30 cm   AR max vel 0.83 cm2   AV Area VTI 0.99 cm2   AV Mean grad 20.0 mmHg   AV Peak grad 35.8 mmHg   Ao pk vel 2.99 m/s   Area-P 1/2 3.37 cm2   AV Area mean vel 0.84 cm2  Glucose, capillary     Status: Abnormal   Collection Time: 01/19/21  5:30 PM  Result Value  Ref Range   Glucose-Capillary 117 (H) 70 - 99 mg/dL    Comment: Glucose reference range applies only to samples taken after fasting for at least 8 hours.  Glucose, capillary     Status: Abnormal   Collection Time: 01/19/21  9:55 PM  Result Value Ref Range   Glucose-Capillary 159 (H) 70 - 99 mg/dL    Comment: Glucose reference range applies only to samples taken after fasting for at least 8 hours.  Glucose, capillary     Status: Abnormal   Collection Time: 01/20/21  8:17 AM  Result Value Ref Range   Glucose-Capillary 155 (H) 70 - 99 mg/dL    Comment: Glucose reference range applies only to samples taken after fasting for at least 8 hours.  Rapid urine drug screen (hospital performed)     Status: Abnormal   Collection Time: 01/20/21 10:12 AM  Result Value Ref Range   Opiates NONE DETECTED NONE DETECTED   Cocaine NONE DETECTED NONE DETECTED   Benzodiazepines POSITIVE (A) NONE DETECTED   Amphetamines NONE DETECTED NONE DETECTED   Tetrahydrocannabinol NONE DETECTED NONE DETECTED   Barbiturates NONE DETECTED NONE DETECTED    Comment: (NOTE) DRUG SCREEN FOR MEDICAL PURPOSES ONLY.  IF CONFIRMATION IS NEEDED FOR ANY PURPOSE, NOTIFY LAB WITHIN 5 DAYS.  LOWEST DETECTABLE LIMITS FOR URINE DRUG SCREEN Drug Class                     Cutoff (ng/mL) Amphetamine and metabolites    1000 Barbiturate and metabolites    200 Benzodiazepine                  500 Tricyclics and metabolites     300 Opiates and metabolites        300 Cocaine and metabolites        300 THC                            50 Performed at [facility], [place] [address], [place], [place] [address]   Glucose, capillary     Status: Abnormal   Collection Time: 01/20/21 11:44 AM  Result Value Ref Range   Glucose-Capillary 133 (H) 70 - 99 mg/dL    Comment: Glucose reference range applies only to samples taken after fasting for at least 8 hours.    Objective: General: Patient is awake, alert, and oriented x 3 and in no acute distress.  Integument: Skin is warm, dry and supple bilateral. Nails are tender, long, thickened and dystrophic with subungual debris, consistent with onychomycosis, 1 through 5 on the left and 1-3 and 5 on right, no signs of infection.  Minimal reactive keratosis plantar right foot.  Remaining integument unremarkable.  Vasculature:  Dorsalis Pedis pulse 1/4 bilateral. Posterior Tibial pulse  0/4 bilateral. Capillary fill time <5 sec in all remaining digits.  Trophic skin just.  No varicosities present bilateral. No edema present bilateral.   Neurology: Protective sensation absent bilateral with Semmes Weinstein monofilament  Musculoskeletal: No pain to palpation.  Patient is status post right fourth toe amputation remains healed.  Assessment and Plan: Problem List Items Addressed This Visit       Endocrine   Diabetic peripheral neuropathy ([place])   Other Visit Diagnoses     Pain due to onychomycosis of toenails of both feet    -  Primary   History of amputation       Xerosis cutis           -  Examined patient. -Re-Discussed and educated patient on diabetic foot care, -Mechanically debrided all nails x9  bilateral using sterile nail nipper and filed with dremel without incident  -Mechanically debrided reactive keratosis plantar right foot using Dremel -Continue with daily skin emollients, encouraged continued use of foot miracle  cream like before -Continue with diabetic shoes -Answered all patient questions -Patient to return  in 3 months for at risk foot care -Patient advised to call the office if any problems or questions arise in the meantime.  [name], DPM

## 2021-04-05 ENCOUNTER — Inpatient Hospital Stay: Payer: Medicare HMO | Admitting: Neurology

## 2021-04-27 ENCOUNTER — Other Ambulatory Visit: Payer: Self-pay

## 2021-04-27 NOTE — Patient Outreach (Signed)
[name]) Care Management  04/27/2021  Craig Rollins 1955-09-17 [id number]   First telephone outreach attempt to obtain mRS. No answer. Left message for returned call.  Philmore Pali [facility] Management Assistant [phone]

## 2021-05-03 ENCOUNTER — Other Ambulatory Visit: Payer: Self-pay

## 2021-05-03 NOTE — Patient Outreach (Signed)
[facility]) Care Management  05/03/2021  Craig Rollins Dec 15, 1955 [id number]   Telephone outreach to patient to obtain mRS was successfully completed. MRS=1  Thank you, [place] Care Management Assistant

## 2021-06-14 ENCOUNTER — Inpatient Hospital Stay: Payer: Medicare HMO | Admitting: Neurology

## 2021-06-27 ENCOUNTER — Ambulatory Visit: Payer: Medicare HMO | Admitting: Cardiology

## 2021-06-27 ENCOUNTER — Ambulatory Visit: Payer: Medicare HMO | Admitting: Sports Medicine

## 2021-07-20 ENCOUNTER — Other Ambulatory Visit: Payer: Self-pay

## 2021-07-20 ENCOUNTER — Encounter: Payer: Self-pay | Admitting: Sports Medicine

## 2021-07-20 ENCOUNTER — Ambulatory Visit (INDEPENDENT_AMBULATORY_CARE_PROVIDER_SITE_OTHER): Payer: Medicare HMO | Admitting: Sports Medicine

## 2021-07-20 DIAGNOSIS — M79674 Pain in right toe(s): Secondary | ICD-10-CM | POA: Diagnosis not present

## 2021-07-20 DIAGNOSIS — B351 Tinea unguium: Secondary | ICD-10-CM | POA: Diagnosis not present

## 2021-07-20 DIAGNOSIS — M79675 Pain in left toe(s): Secondary | ICD-10-CM

## 2021-07-20 DIAGNOSIS — Z899 Acquired absence of limb, unspecified: Secondary | ICD-10-CM

## 2021-07-20 DIAGNOSIS — E1142 Type 2 diabetes mellitus with diabetic polyneuropathy: Secondary | ICD-10-CM

## 2021-07-20 DIAGNOSIS — L853 Xerosis cutis: Secondary | ICD-10-CM

## 2021-07-20 NOTE — Progress Notes (Signed)
Subjective: ?Craig Rollins is a 66 y.o. male patient with history of diabetes who presents to office today complaining of long,mildly painful nails  while ambulating in shoes; unable to trim.  Last PCP Craig Iba, NP was 1 month ago. ?FBS this am 92 ?A1C 7.2 ? ? ?Reports that he just recently found out he has a milk allergy and possible shrimp allergy and since he has made a diet change it has helped his skin. ? ?Patient Active Problem List  ? Diagnosis Date Noted  ? Chest pain of uncertain etiology   ? Hypertensive heart disease 04/27/2020  ? TIA (transient ischemic attack)   ? Myocardial infarction [facility])   ? High cholesterol   ? Diabetes ([place])   ? Cataract   ? Brain aneurysm   ? Hypercholesteremia 11/23/2019  ? Unstable angina ([place]) 03/30/2019  ? Chest discomfort 08/13/2018  ? CHF (congestive heart failure) ([place]) 08/13/2018  ? COPD (chronic obstructive pulmonary disease) ([place]) 08/13/2018  ? Open wound 08/13/2018  ? Shortness of breath 08/13/2018  ? CAD in native artery 02/18/2018  ? Aortic stenosis 02/18/2018  ? Essential hypertension 02/18/2018  ? Foot swelling 01/26/2018  ? Diabetes mellitus, type 2 ([place]) 01/26/2018  ? Diabetic peripheral neuropathy ([place]) 02/25/2017  ? Hammer toes of both feet 02/25/2017  ? Risk for falls 09/23/2016  ? NSTEMI (non-ST elevated myocardial infarction) ([place]) 08/30/2016  ? Wound eschar of foot 08/21/2016  ? Dilated cardiomyopathy ([place]) 07/16/2016  ? Dyslipidemia (high LDL; low HDL) 07/16/2016  ? Noncompliance 07/16/2016  ? Smoking 07/16/2016  ? Stroke ([place]) 12/19/2015  ? ?Current Outpatient Medications on File Prior to Visit  ?Medication Sig Dispense Refill  ? albuterol (VENTOLIN HFA) 108 (90 Base) MCG/ACT inhaler Inhale 2 puffs into the lungs every 6 (six) hours as needed for wheezing or shortness of breath.    ? aspirin EC 81 MG tablet Take 81 mg by mouth daily. Swallow whole.    ? atorvastatin (LIPITOR) 80 MG tablet Take 1 tablet (80 mg total) by mouth daily. 30 tablet 2   ? carvedilol (COREG) 3.125 MG tablet Take 6.25 mg by mouth every 12 (twelve) hours.    ? clopidogrel (PLAVIX) 75 MG tablet TAKE 1 TABLET BY MOUTH DAILY 90 tablet 2  ? dapagliflozin propanediol (FARXIGA) 10 MG TABS tablet Take 1 tablet (10 mg total) by mouth daily before breakfast. 90 tablet 3  ? diphenhydrAMINE (BENADRYL) 25 MG tablet Take 25 mg by mouth 2 (two) times daily.    ? EPINEPHrine 0.3 mg/0.3 mL IJ SOAJ injection Inject into the muscle.    ? finasteride (PROSCAR) 5 MG tablet Take 5 mg by mouth daily.    ? fluocinonide ointment (LIDEX) 0.05 % Apply topically.    ? furosemide (LASIX) 40 MG tablet Take 40 mg by mouth daily.    ? glipiZIDE (GLUCOTROL) 5 MG tablet Take 2.5 mg by mouth 2 (two) times daily before a meal.    ? hydrOXYzine (ATARAX) 10 MG tablet Take by mouth.    ? isosorbide mononitrate (IMDUR) 30 MG 24 hr tablet Take by mouth.    ? lisinopril (ZESTRIL) 5 MG tablet Take 5 mg by mouth daily.    ? metFORMIN (GLUCOPHAGE) 500 MG tablet Take 500 mg by mouth 2 (two) times daily with a meal.    ? nitroGLYCERIN (NITROSTAT) 0.4 MG SL tablet Place 0.4 mg under the tongue every 5 (five) minutes as needed for chest pain.    ? Nutritional Supplements (JUICE  PLUS FIBRE PO) Take 8 capsules by mouth See admin instructions. Vegetable 4 capsule ? and fruit 4 capsule daily    ? tamsulosin (FLOMAX) 0.4 MG CAPS capsule Take 0.4 mg by mouth daily.    ? topiramate (TOPAMAX) 50 MG tablet Take 1 tablet (50 mg total) by mouth daily. 30 tablet 3  ? TRESIBA FLEXTOUCH 100 UNIT/ML FlexTouch Pen Inject 12 Units into the skin at bedtime.    ? triamcinolone cream (KENALOG) 0.1 % Apply 1 application topically 2 (two) times daily.    ? varenicline (CHANTIX STARTING MONTH PAK) 0.5 MG X 11 & 1 MG X 42 tablet Take one 0.5 mg tablet by mouth once daily for 3 days, then increase to one 0.5 mg tablet twice daily for 4 days, then increase to one 1 mg tablet twice daily. 53 tablet 0  ? ?No current facility-administered medications on file  prior to visit.  ? ?Allergies  ?Allergen Reactions  ? Penicillins Nausea And Vomiting  ?  Reaction: Childhood  ? Milk Protein Itching and Hives  ? Milk-Related Compounds Hives and Itching  ? ? ?No results found for this or any previous visit (from the past 2160 hour(s)). ? ? ?Objective: ?General: Patient is awake, alert, and oriented x 3 and in no acute distress. ? ?Integument: Skin is warm, dry and supple bilateral. Nails are tender, long, thickened and dystrophic with subungual debris, consistent with onychomycosis, 1 through 5 on the left and 1-3 and 5 on right, no signs of infection.  Minimal reactive keratosis plantar right foot.  Dry flaky skin bilateral lower extremities.  Remaining integument unremarkable. ? ?Vasculature:  Dorsalis Pedis pulse 1/4 bilateral. Posterior Tibial pulse  0/4 bilateral. Capillary fill time <5 sec in all remaining digits.  Trophic skin with dryness as above.  No varicosities present bilateral. No edema present bilateral.  ? ?Neurology: Protective sensation absent bilateral with Thornell Mule monofilament ? ?Musculoskeletal: No pain to palpation. Status post right fourth toe amputation, well-healed. ? ?Assessment and Plan: ?Problem List Items Addressed This Visit   ? ?  ? Endocrine  ? Diabetic peripheral neuropathy ([place])  ? Relevant Medications  ? hydrOXYzine (ATARAX) 10 MG tablet  ? ?Other Visit Diagnoses   ? ? Pain due to onychomycosis of toenails of both feet    -  Primary  ? History of amputation      ? Xerosis cutis      ? ?  ? ? ?-Examined patient. ?-Re-Discussed and educated patient on diabetic foot care, ?-Mechanically debrided all painful nails x9  bilateral using sterile nail nipper and filed with dremel without incident  ?-Mechanically debrided reactive keratosis plantar right foot using Dremel ?-Continue with daily skin emollients, encouraged continued use of foot miracle cream like before ?-Continue with diabetic shoes like previous that do not rub ?-Answered all  patient questions ?-Patient to return  in 3 months for at risk foot care ?-Patient advised to call the office if any problems or questions arise in the meantime. ? ?[name], DPM ?

## 2021-08-24 ENCOUNTER — Ambulatory Visit (INDEPENDENT_AMBULATORY_CARE_PROVIDER_SITE_OTHER): Payer: Medicare HMO | Admitting: Cardiology

## 2021-08-24 ENCOUNTER — Encounter: Payer: Self-pay | Admitting: Cardiology

## 2021-08-24 VITALS — BP 118/70 | HR 82 | Ht 76.0 in | Wt 268.0 lb

## 2021-08-24 DIAGNOSIS — I2510 Atherosclerotic heart disease of native coronary artery without angina pectoris: Secondary | ICD-10-CM

## 2021-08-24 DIAGNOSIS — E7849 Other hyperlipidemia: Secondary | ICD-10-CM

## 2021-08-24 DIAGNOSIS — I110 Hypertensive heart disease with heart failure: Secondary | ICD-10-CM | POA: Diagnosis not present

## 2021-08-24 DIAGNOSIS — I5042 Chronic combined systolic (congestive) and diastolic (congestive) heart failure: Secondary | ICD-10-CM

## 2021-08-24 DIAGNOSIS — I350 Nonrheumatic aortic (valve) stenosis: Secondary | ICD-10-CM

## 2021-08-24 MED ORDER — EZETIMIBE 10 MG PO TABS: 10.0000 mg | ORAL_TABLET | Freq: Every day | ORAL | 3 refills | Status: AC

## 2021-08-24 NOTE — Progress Notes (Signed)
?Cardiology Office Note:   ? ?Date:  08/24/2021  ? ?ID:  Craig Rollins, DOB 17-Apr-1956, MRN [id number] ? ?PCP:  [name], NP  ?Cardiologist:  Shirlee More, MD   ? ?Referring MD: [name], NP  ? ? ?ASSESSMENT:   ? ?1. CAD in native artery   ?2. Hypertensive heart disease with chronic combined systolic and diastolic congestive heart failure ([place])   ?3. Familial hyperlipidemia   ?4. Aortic valve stenosis, etiology of cardiac valve disease unspecified   ? ?PLAN:   ? ?In order of problems listed above: ? ?Stable CAD having no anginal discomfort continue dual antiplatelet therapy beta-blocker oral nitrate statin and add Zetia to achieve goal LDL less than 55 also screen for LP(a) at that time ?Stable no evidence of fluid overload continue his diuretic ACE inhibitor ?Consider repeat echocardiogram next visit asymptomatic mild aortic stenosis ? ? ?Next appointment: 1 year ? ? ?Medication Adjustments/Labs and Tests Ordered: ?Current medicines are reviewed at length with the patient today.  Concerns regarding medicines are outlined above.  ?No orders of the defined types were placed in this encounter. ? ?No orders of the defined types were placed in this encounter. ? ? ?Chief Complaint  ?Patient presents with  ? Follow-up  ?For CAD in the interim he had a strokelike syndrome resolution with tPA ? ?History of Present Illness:   ? ?Craig Rollins is a 66 y.o. male with a hx of CAD with PCI and stent right coronary artery 04/08/2019 hypertensive heart disease without heart failure familial hyperlipidemia and ischemic cardiomyopathy with with mild aortic stenosis and LV dysfunction EF in the range of 40% peripheral arterial disease and previous pulmonary embolism.  He was then admitted to [facility] December 2021 and discharged the next day.  Myocardial perfusion study showed an EF of 36% and inferior lateral scar and hypokinesia fixed defect scar no ischemia. He underwent left heart catheterization  05/05/2020 showing patent stents in the LAD and right coronary artery mild to moderate mid LAD stenosis not flow-limiting  He was advised ongoing medical treatment and oral mononitrate was added to his regimen.  He was last seen 12/25/2020 .  He was admitted to [facility] 01/19/2021 with strokelike syndrome treated with tPA with complete clinical resolution CT angio head and neck showed multifocal multivessel stenosis with no significant large vessel occlusion. ? ?Stroke symptoms s/p tPA - left brain TIA secondary to  small vessel disease versus exacerbation of old deficits. Of course, cardioembolic source cannot be completely ruled out at this time ?Code Stroke  CT head No acute abnormality.  ASPECTS 10.     ?CTA head & neck: Aortic atherosclerosis, no LVO  ?MRI - No evidence of acute infarction. No acute intracranial abnormality is identified. Re-demonstrated chronic lacunar infarcts within the bilateral deep gray nuclei.  ?2D Echo - EF 45 to 50%  ?Recommend 30-day CardioNet monitoring with patient cardiologist Dr. Bettina Gavia ?LDL 85 ?HgbA1c 7.4 ?VTE prophylaxis - scd ?aspirin 81 mg daily and clopidogrel 75 mg daily prior to admission, continue aspirin 81 mg daily and plavix 75 DAPT on discharge. ? ?Coronary Diagrams ?Diagnostic ?Dominance: Right ?  ?  ?  ?  ?03/20/2020 ejection fraction 40 to 45% with global hypokinesia and moderate aortic stenosis.  During catheterization he had a 24 mm peak gradient LV to aorta.   ? ?Compliance with diet, lifestyle and medications: Yes ? ?Made a full recovery from the stroke ?Tolerates his dual antiplatelet therapy without  GI upset ?He tolerates his statin without muscle pain or weakness ?He is pleased with the quality of his life he is having no chest pain edema shortness of breath palpitation or syncope ?Recent labs show residual LDL elevation at 88 cholesterol 131 A1c 7.4 hemoglobin 13.1 creatinine 0.94 potassium 4.8 ?Past Medical History:  ?Diagnosis Date  ? Aortic  stenosis 02/18/2018  ? Brain aneurysm   ? CAD in native artery 02/18/2018  ? Cataract   ? Chest discomfort 08/13/2018  ? CHF (congestive heart failure) ([place])   ? COPD (chronic obstructive pulmonary disease) ([place])   ? Diabetes ([place])   ? Diabetes mellitus, type 2 ([place]) 01/26/2018  ? Diabetic peripheral neuropathy ([place]) 02/25/2017  ? Dilated cardiomyopathy ([place]) 07/16/2016  ? Ejection fraction 30% based on echo from 2018  ? Dyslipidemia (high LDL; low HDL) 07/16/2016  ? Essential hypertension 02/18/2018  ? Foot swelling 01/26/2018  ? Hammer toes of both feet 02/25/2017  ? High cholesterol   ? Hypercholesteremia 11/23/2019  ? Myocardial infarction [facility])   ? Noncompliance 07/16/2016  ? NSTEMI (non-ST elevated myocardial infarction) ([place]) 08/30/2016  ? Shortness of breath 08/13/2018  ? Smoking 07/16/2016  ? Stroke [facility])   ? TIA (transient ischemic attack)   ? Unstable angina ([place]) 03/30/2019  ? Wound eschar of foot 08/21/2016  ? ? ?Past Surgical History:  ?Procedure Laterality Date  ? CORONARY ANGIOPLASTY WITH STENT PLACEMENT    ? KNEE SURGERY    ? LEFT HEART CATH AND CORONARY ANGIOGRAPHY N/A 05/05/2020  ? Procedure: LEFT HEART CATH AND CORONARY ANGIOGRAPHY;  Surgeon: Burnell Blanks, MD;  Location: [place] [facility];  Service: Cardiovascular;  Laterality: N/A;  ? ? ?Current Medications: ?Current Meds  ?Medication Sig  ? albuterol (VENTOLIN HFA) 108 (90 Base) MCG/ACT inhaler Inhale 2 puffs into the lungs every 6 (six) hours as needed for wheezing or shortness of breath.  ? aspirin EC 81 MG tablet Take 81 mg by mouth daily. Swallow whole.  ? atorvastatin (LIPITOR) 80 MG tablet Take 1 tablet (80 mg total) by mouth daily.  ? carvedilol (COREG) 3.125 MG tablet Take 6.25 mg by mouth every 12 (twelve) hours.  ? clopidogrel (PLAVIX) 75 MG tablet TAKE 1 TABLET BY MOUTH DAILY  ? dapagliflozin propanediol (FARXIGA) 10 MG TABS tablet Take 1 tablet (10 mg total) by mouth daily before breakfast.  ? diphenhydrAMINE (BENADRYL) 25 MG tablet Take 25  mg by mouth 2 (two) times daily.  ? EPINEPHrine 0.3 mg/0.3 mL IJ SOAJ injection Inject 0.3 mg into the muscle as needed for anaphylaxis.  ? finasteride (PROSCAR) 5 MG tablet Take 5 mg by mouth daily.  ? fluocinonide ointment (LIDEX) 3.15 % Apply 1 application. topically 2 (two) times daily.  ? furosemide (LASIX) 40 MG tablet Take 40 mg by mouth daily.  ? glipiZIDE (GLUCOTROL) 5 MG tablet Take 2.5 mg by mouth 2 (two) times daily before a meal.  ? hydrOXYzine (ATARAX) 10 MG tablet Take 10 mg by mouth 3 (three) times daily as needed for itching.  ? isosorbide mononitrate (IMDUR) 30 MG 24 hr tablet Take 30 mg by mouth daily.  ? lisinopril (ZESTRIL) 5 MG tablet Take 5 mg by mouth daily.  ? metFORMIN (GLUCOPHAGE) 500 MG tablet Take 500 mg by mouth 2 (two) times daily with a meal.  ? nitroGLYCERIN (NITROSTAT) 0.4 MG SL tablet Place 0.4 mg under the tongue every 5 (five) minutes as needed for chest pain.  ? Nutritional Supplements (JUICE PLUS FIBRE  PO) Take 8 capsules by mouth See admin instructions. Vegetable 4 capsule ? and fruit 4 capsule daily  ? tamsulosin (FLOMAX) 0.4 MG CAPS capsule Take 0.4 mg by mouth daily.  ? topiramate (TOPAMAX) 50 MG tablet Take 1 tablet (50 mg total) by mouth daily.  ? TRESIBA FLEXTOUCH 100 UNIT/ML FlexTouch Pen Inject 12 Units into the skin at bedtime.  ? triamcinolone cream (KENALOG) 0.1 % Apply 1 application topically 2 (two) times daily.  ?  ? ?Allergies:   Penicillins, Milk protein, Milk-related compounds, and Shrimp (diagnostic)  ? ?Social History  ? ?Socioeconomic History  ? Marital status: Single  ?  Spouse name: Not on file  ? Number of children: Not on file  ? Years of education: Not on file  ? Highest education level: Not on file  ?Occupational History  ? Not on file  ?Tobacco Use  ? Smoking status: Every Day  ?  Packs/day: 1.00  ?  Years: 55.00  ?  Pack years: 55.00  ?  Types: Cigarettes  ? Smokeless tobacco: Never  ?Vaping Use  ? Vaping Use: Former  ?Substance and Sexual Activity   ? Alcohol use: Not Currently  ? Drug use: Never  ? Sexual activity: Not on file  ?Other Topics Concern  ? Not on file  ?Social History Narrative  ? Not on file  ? ?Social Determinants of Health  ? ?Financi

## 2021-08-24 NOTE — Patient Instructions (Signed)
Medication Instructions:  ?Your physician has recommended you make the following change in your medication:  ?Start Zetia 10 mg once daily ? ?*If you need a refill on your cardiac medications before your next appointment, please call your pharmacy* ? ? ?Lab Work: ?Your physician recommends that you return for lab work in: 6 - 8 weeks for Lipid Panel and LPa. Lab opens at 8am, you do not need appt for this. Please come in fasting, nothing to eat or drink after midnight ? ?If you have labs (blood work) drawn today and your tests are completely normal, you will receive your results only by: ?MyChart Message (if you have MyChart) OR ?A paper copy in the mail ?If you have any lab test that is abnormal or we need to change your treatment, we will call you to review the results. ? ? ?Testing/Procedures: ?NONE ? ? ?Follow-Up: ?At [facility] you and your health needs are our priority.  As part of our continuing mission to provide you with exceptional heart care, we have created designated Provider Care Teams.  These Care Teams include your primary Cardiologist (physician) and Advanced Practice Providers (APPs -  Physician Assistants and Nurse Practitioners) who all work together to provide you with the care you need, when you need it. ? ?We recommend signing up for the patient portal called "MyChart".  Sign up information is provided on this After Visit Summary.  MyChart is used to connect with patients for Virtual Visits (Telemedicine).  Patients are able to view lab/test results, encounter notes, upcoming appointments, etc.  Non-urgent messages can be sent to your provider as well.   ?To learn more about what you can do with MyChart, go to [email or web address].   ? ?Your next appointment:   ?9 month(s) ? ?The format for your next appointment:   ?In Person ? ?Provider:   ?Shirlee More, MD  ? ? ?Other Instructions ?  ?

## 2021-08-27 ENCOUNTER — Encounter: Payer: Self-pay | Admitting: Allergy and Immunology

## 2021-08-27 ENCOUNTER — Ambulatory Visit (INDEPENDENT_AMBULATORY_CARE_PROVIDER_SITE_OTHER): Payer: Medicare HMO | Admitting: Allergy and Immunology

## 2021-08-27 VITALS — BP 126/82 | HR 102 | Resp 16 | Wt 268.0 lb

## 2021-08-27 DIAGNOSIS — J449 Chronic obstructive pulmonary disease, unspecified: Secondary | ICD-10-CM | POA: Diagnosis not present

## 2021-08-27 DIAGNOSIS — T781XXA Other adverse food reactions, not elsewhere classified, initial encounter: Secondary | ICD-10-CM | POA: Diagnosis not present

## 2021-08-27 DIAGNOSIS — L2089 Other atopic dermatitis: Secondary | ICD-10-CM

## 2021-08-27 DIAGNOSIS — J3089 Other allergic rhinitis: Secondary | ICD-10-CM

## 2021-08-27 DIAGNOSIS — D7219 Other eosinophilia: Secondary | ICD-10-CM

## 2021-08-27 MED ORDER — BREZTRI AEROSPHERE 160-9-4.8 MCG/ACT IN AERO: INHALATION_SPRAY | RESPIRATORY_TRACT | 5 refills | Status: AC

## 2021-08-27 NOTE — Progress Notes (Signed)
?Craig Rollins ? ? ?Dear Craig Rollins, ? ?Thank you for referring Craig Rollins to the [facility] on 08/27/2021.  ? ?Below is a summation of this patient's evaluation and recommendations. ? ?Thank you for your referral. I will keep you informed about this patient's response to treatment.  ? ?If you have any questions please do not hesitate to contact me.  ? ?Sincerely, ? ?Jiles Prows, MD ?Allergy / Immunology ?[facility] [place] ? ? ?______________________________________________________________________ ? ? ? ?NEW PATIENT NOTE ? ?Referring Provider: [name], NP ?Primary Provider: [name], NP ?Date of office visit: 08/27/2021 ?   ?Subjective:  ? ?Chief Complaint:  Craig Rollins (DOB: [birth year]) is a 66 y.o. male who presents to the clinic on 08/27/2021 with a chief complaint of Allergic Reaction ?.    ? ?HPI: Craig Rollins presents to this clinic in evaluation of several issues. ? ?First, he apparently has a sensitivity to dairy.  He states that he has had a multiyear history of eczema involving his extremities especially his upper arms and his trunk that has been treated with a multitude of various topical agents that only resolved after he became milk free over the course of the past 4 months.  He no longer requires any topical agents. ? ?Second, the reason he became milk free over the course of the past 4 months is because he has had 3 allergic reactions that have occurred over the course of the past several years tied up with milk consumption.  He developed global red raised itchy lesions across his body after consumption of milk products usually treated in the emergency room setting without any associated systemic or constitutional symptoms.  Usually in the emergency room setting he is treated with steroids and Benadryl and his reaction resolves within days.  In  evaluation of his reactions blood tests were performed which identified IgE antibodies against both milk and shellfish.  He has not had any allergic reactions while being milk or shellfish for a over the course of the past 4 months. ? ?Third, he has stuffy nose and sneezing on a pretty consistent basis without any anosmia and without any trigger for which he will use nasal fluticasone occasionally and some Benadryl.  The symptoms occur on a perennial basis. ? ?Fourth, he has cough in the morning and some breathing problems with shortness of breath in the context of smoking currently 1 pack/day of cigarettes for stress reduction over the course of many years.  He has also recently been using nicotine pouches that helped him cut back his tobacco smoking from 2 packs/day to 1 pack/day.  He has been given a Breztri inhaler which he uses mostly before going to bed and he has a Combivent inhaler which he occasionally uses. ? ?Past Medical History:  ?Diagnosis Date  ? Aortic stenosis 02/18/2018  ? Brain aneurysm   ? CAD in native artery 02/18/2018  ? Cataract   ? Chest discomfort 08/13/2018  ? CHF (congestive heart failure) ([place])   ? COPD (chronic obstructive pulmonary disease) ([place])   ? Diabetes ([place])   ? Diabetes mellitus, type 2 ([place]) 01/26/2018  ? Diabetic peripheral neuropathy ([place]) 02/25/2017  ? Dilated cardiomyopathy ([place]) 07/16/2016  ? Ejection fraction 30% based on echo from 2018  ? Dyslipidemia (high LDL; low HDL) 07/16/2016  ? Eczema   ? Essential hypertension 02/18/2018  ?  Foot swelling 01/26/2018  ? Hammer toes of both feet 02/25/2017  ? High cholesterol   ? Hypercholesteremia 11/23/2019  ? Myocardial infarction [facility])   ? Noncompliance 07/16/2016  ? NSTEMI (non-ST elevated myocardial infarction) ([place]) 08/30/2016  ? Shortness of breath 08/13/2018  ? Smoking 07/16/2016  ? Stroke [facility])   ? TIA (transient ischemic attack)   ? Unstable angina ([place]) 03/30/2019  ? Wound eschar of foot 08/21/2016  ? ? ?Past Surgical  History:  ?Procedure Laterality Date  ? CORONARY ANGIOPLASTY WITH STENT PLACEMENT    ? KNEE SURGERY    ? LEFT HEART CATH AND CORONARY ANGIOGRAPHY N/A 05/05/2020  ? Procedure: LEFT HEART CATH AND CORONARY ANGIOGRAPHY;  Surgeon: Burnell Blanks, MD;  Location: [place] [facility];  Service: Cardiovascular;  Laterality: N/A;  ? TOE AMPUTATION Right   ? ? ?Allergies as of 08/27/2021   ? ?   Reactions  ? Penicillins Nausea And Vomiting  ? Reaction: Childhood  ? Milk Protein Itching, Hives  ? Milk-related Compounds Hives, Itching  ? Shrimp (diagnostic) Rash  ? ?  ? ?  ?Medication List  ? ? ?albuterol 108 (90 Base) MCG/ACT inhaler ?Commonly known as: VENTOLIN HFA ?Inhale 2 puffs into the lungs every 6 (six) hours as needed for wheezing or shortness of breath. ?  ?aspirin EC 81 MG tablet ?Take 81 mg by mouth daily. Swallow whole. ?  ?atorvastatin 80 MG tablet ?Commonly known as: LIPITOR ?Take 1 tablet (80 mg total) by mouth daily. ?  ?carvedilol 3.125 MG tablet ?Commonly known as: COREG ?Take 6.25 mg by mouth every 12 (twelve) hours. ?  ?clopidogrel 75 MG tablet ?Commonly known as: PLAVIX ?TAKE 1 TABLET BY MOUTH DAILY ?  ?dapagliflozin propanediol 10 MG Tabs tablet ?Commonly known as: [place] ?Take 1 tablet (10 mg total) by mouth daily before breakfast. ?  ?diphenhydrAMINE 25 MG tablet ?Commonly known as: BENADRYL ?Take 25 mg by mouth 2 (two) times daily. ?  ?EPINEPHrine 0.3 mg/0.3 mL Soaj injection ?Commonly known as: EPI-PEN ?Inject 0.3 mg into the muscle as needed for anaphylaxis. ?  ?ezetimibe 10 MG tablet ?Commonly known as: ZETIA ?Take 1 tablet (10 mg total) by mouth daily. ?  ?finasteride 5 MG tablet ?Commonly known as: PROSCAR ?Take 5 mg by mouth daily. ?  ?fluocinonide ointment 0.05 % ?Commonly known as: LIDEX ?Apply 1 application. topically 2 (two) times daily. ?  ?fluticasone 50 MCG/ACT nasal spray ?Commonly known as: FLONASE ?Place 1 spray into both nostrils daily. ?  ?furosemide 40 MG tablet ?Commonly  known as: LASIX ?Take 40 mg by mouth daily. ?  ?glipiZIDE 5 MG tablet ?Commonly known as: GLUCOTROL ?Take 2.5 mg by mouth 2 (two) times daily before a meal. ?  ?hydrOXYzine 10 MG tablet ?Commonly known as: ATARAX ?Take 10 mg by mouth 3 (three) times daily as needed for itching. ?  ?isosorbide mononitrate 30 MG 24 hr tablet ?Commonly known as: IMDUR ?Take 30 mg by mouth daily. ?  ?JUICE PLUS FIBRE PO ?Take 8 capsules by mouth See admin instructions. Vegetable 4 capsule ? and fruit 4 capsule daily ?  ?lisinopril 5 MG tablet ?Commonly known as: ZESTRIL ?Take 5 mg by mouth daily. ?  ?metFORMIN 500 MG tablet ?Commonly known as: GLUCOPHAGE ?Take 500 mg by mouth 2 (two) times daily with a meal. ?  ?nitroGLYCERIN 0.4 MG SL tablet ?Commonly known as: NITROSTAT ?Place 0.4 mg under the tongue every 5 (five) minutes as needed for chest pain. ?  ?tamsulosin  0.4 MG Caps capsule ?Commonly known as: FLOMAX ?Take 0.4 mg by mouth daily. ?  ?topiramate 50 MG tablet ?Commonly known as: Topamax ?Take 1 tablet (50 mg total) by mouth daily. ?  ?Tyler Aas FlexTouch 100 UNIT/ML FlexTouch Pen ?Generic drug: insulin degludec ?Inject 12 Units into the skin at bedtime. ?  ?triamcinolone cream 0.1 % ?Commonly known as: KENALOG ?Apply 1 application topically 2 (two) times daily. ?  ? ?Review of systems negative except as noted in HPI / PMHx or noted below: ? ?Review of Systems  ?Constitutional: Negative.   ?HENT: Negative.    ?Eyes: Negative.   ?Respiratory: Negative.    ?Cardiovascular: Negative.   ?Gastrointestinal: Negative.   ?Genitourinary: Negative.   ?Musculoskeletal: Negative.   ?Skin: Negative.   ?Neurological: Negative.   ?Endo/Heme/Allergies: Negative.   ?Psychiatric/Behavioral: Negative.    ? ?Family History  ?Problem Relation Age of Onset  ? Hypertension Father   ? Hyperlipidemia Father   ? Heart disease Father   ? Heart attack Father   ? Diabetes Father   ? Liver cancer Maternal Grandfather   ? Breast cancer Paternal Grandmother   ?  Heart attack Paternal Grandfather   ? ? ?Social History  ? ?Socioeconomic History  ? Marital status: Single  ?  Spouse name: Not on file  ? Number of children: Not on file  ? Years of education: Not on file  ?

## 2021-08-27 NOTE — Patient Instructions (Addendum)
?  1.  Allergen avoidance measures -dairy, shellfish ? ?2.  Treat and prevent inflammation of airway: ? ?A. Use nicotine substitutes to replace smoke exposure ?[place] Knife - 2 inhalations 1-2 times per day (empty lungs) ?C. Fluticasone - 2 sprays each nostril 3-7 times per week ? ?3. If needed: ? ?A. Albuterol HFA - 2 inhalations every 4-6 hours ?B. Loratadine 10 mg - 1 tablet 1 time per day ?C. EpiPen, Benadryl, MD/ER evaluation for allergic reaction ? ?4. Lisinopril??? ? ?5. Blood - milk component panel, CBC w/d ? ?6. Return to clinic in 4 weeks or earlier if problem ?  ?

## 2021-08-28 ENCOUNTER — Encounter: Payer: Self-pay | Admitting: Allergy and Immunology

## 2021-08-31 ENCOUNTER — Encounter: Payer: Self-pay | Admitting: *Deleted

## 2021-09-19 ENCOUNTER — Other Ambulatory Visit: Payer: Self-pay | Admitting: Hematology and Oncology

## 2021-09-19 ENCOUNTER — Other Ambulatory Visit: Payer: Self-pay

## 2021-09-19 DIAGNOSIS — K8689 Other specified diseases of pancreas: Secondary | ICD-10-CM

## 2021-09-21 ENCOUNTER — Inpatient Hospital Stay: Payer: Medicare HMO | Attending: Hematology and Oncology | Admitting: Hematology and Oncology

## 2021-09-21 ENCOUNTER — Encounter: Payer: Self-pay | Admitting: Hematology and Oncology

## 2021-09-21 ENCOUNTER — Telehealth: Payer: Self-pay | Admitting: Hematology and Oncology

## 2021-09-21 ENCOUNTER — Inpatient Hospital Stay: Payer: Medicare HMO

## 2021-09-21 ENCOUNTER — Other Ambulatory Visit: Payer: Self-pay | Admitting: Hematology and Oncology

## 2021-09-21 VITALS — BP 123/60 | HR 100 | Temp 98.2°F | Resp 20 | Ht 76.0 in | Wt 268.0 lb

## 2021-09-21 DIAGNOSIS — R1011 Right upper quadrant pain: Secondary | ICD-10-CM | POA: Insufficient documentation

## 2021-09-21 DIAGNOSIS — D378 Neoplasm of uncertain behavior of other specified digestive organs: Secondary | ICD-10-CM | POA: Diagnosis not present

## 2021-09-21 DIAGNOSIS — D649 Anemia, unspecified: Secondary | ICD-10-CM | POA: Diagnosis not present

## 2021-09-21 DIAGNOSIS — K8689 Other specified diseases of pancreas: Secondary | ICD-10-CM

## 2021-09-21 DIAGNOSIS — I509 Heart failure, unspecified: Secondary | ICD-10-CM | POA: Insufficient documentation

## 2021-09-21 DIAGNOSIS — I252 Old myocardial infarction: Secondary | ICD-10-CM | POA: Insufficient documentation

## 2021-09-21 DIAGNOSIS — K5900 Constipation, unspecified: Secondary | ICD-10-CM | POA: Insufficient documentation

## 2021-09-21 DIAGNOSIS — I350 Nonrheumatic aortic (valve) stenosis: Secondary | ICD-10-CM | POA: Diagnosis not present

## 2021-09-21 DIAGNOSIS — R059 Cough, unspecified: Secondary | ICD-10-CM | POA: Diagnosis not present

## 2021-09-21 DIAGNOSIS — K869 Disease of pancreas, unspecified: Secondary | ICD-10-CM | POA: Insufficient documentation

## 2021-09-21 DIAGNOSIS — R140 Abdominal distension (gaseous): Secondary | ICD-10-CM | POA: Diagnosis not present

## 2021-09-21 DIAGNOSIS — R160 Hepatomegaly, not elsewhere classified: Secondary | ICD-10-CM

## 2021-09-21 DIAGNOSIS — E1165 Type 2 diabetes mellitus with hyperglycemia: Secondary | ICD-10-CM | POA: Diagnosis not present

## 2021-09-21 LAB — APTT: aPTT: 37 seconds — ABNORMAL HIGH (ref 24–36)

## 2021-09-21 LAB — BASIC METABOLIC PANEL
BUN: 19 (ref 4–21)
CO2: 27 — AB (ref 13–22)
Chloride: 98 — AB (ref 99–108)
Creatinine: 0.9 (ref 0.6–1.3)
Glucose: 236
Potassium: 4.5 mEq/L (ref 3.5–5.1)
Sodium: 133 — AB (ref 137–147)

## 2021-09-21 LAB — CBC AND DIFFERENTIAL
HCT: 36 — AB (ref 41–53)
Hemoglobin: 11 — AB (ref 13.5–17.5)
Neutrophils Absolute: 22.52
Platelets: 311 10*3/uL (ref 150–400)
WBC: 28.5

## 2021-09-21 LAB — FOLATE: Folate: 6.7 ng/mL (ref >5.9)

## 2021-09-21 LAB — FERRITIN: Ferritin: 510 ng/mL — ABNORMAL HIGH (ref 24–336)

## 2021-09-21 LAB — HEPATIC FUNCTION PANEL
ALT: 26 U/L (ref 10–40)
AST: 23 (ref 14–40)
Alkaline Phosphatase: 183 — AB (ref 25–125)
Bilirubin, Total: 0.7

## 2021-09-21 LAB — PROTIME-INR
INR: 1.2 (ref 0.8–1.2)
Prothrombin Time: 14.9 seconds (ref 11.4–15.2)

## 2021-09-21 LAB — VITAMIN B12: Vitamin B-12: 349 pg/mL (ref 180–914)

## 2021-09-21 LAB — COMPREHENSIVE METABOLIC PANEL
Albumin: 2.9 — AB (ref 3.5–5.0)
Calcium: 8.9 (ref 8.7–10.7)

## 2021-09-21 LAB — CBC: RBC: 4.31 (ref 3.87–5.11)

## 2021-09-21 NOTE — Telephone Encounter (Signed)
[place] CORE BIOPSY (LIVER): Scheduled for 10/04/21.Pt is to arrive at 8:30 am for labs and actual procedure will take place at 10:30 am. He will then be there 2-4 hours after the procedure is completed. ? ?INSTRUCTIONS: ?ASA to be on hold 3 days prior to biopsy ?Plavix to be on hold 5 days prior ?NPO after midnight the night before ?Pt will need someone to stay with him throughout the whole time he is there along to drive him home. ? ? ? ?LVM for patient to call our office back so I can notify him of the above information as well schedule a follow-up appt. ? ?

## 2021-09-21 NOTE — Progress Notes (Cosign Needed)
?[place]  ?[address] ?[place],  [place]  [address] ?[phone] ? ?Clinic Day:  09/21/2021 ? ?Referring physician: [name], NP ? ? ?REASON FOR CONSULTATION:  ?Pancreatic mass with liver masses ? ?HISTORY OF PRESENT ILLNESS:  ?Craig Rollins is a 66 y.o. male with a new finding of pancreatic mass and multiple liver masses on CT imaging done in the emergency room on April 27.  He is referred in consultation by Darrol Jump, PA-C for assessment and management.  He presented to the emergency room with abdominal pain and constipation, as well as an episode of vomiting.  Unfortunately, CT abdomen and pelvis revealed a pancreatic body mass measuring 5.5 cm highly suspicious for primary malignancy with innumerable hepatic metastatic lesions measuring up to 6.9 cm likely from the pancreatic primary.  He had leukocytosis and normochromic anemia, as well as an elevated glucose, CRP and lipase.  He was given Percocet 5/325 1 every 6 hours as needed for pain.  Since being seen in the emergency room, he has had increasing fatigue and more difficulty getting around.  He has persistent right upper quadrant pain.   He states he has only been using a half a tablet of Percocet, which slightly eases his pain.  He does not want to take more narcotics unless necessary.  He reports bloating and constipation, for which he is using MiraLAX, which causes 1 loose stool per day.  He denies vomiting.  He is short of breath with exertion.  He has a chronic congested cough usually productive of white sputum.  He is not clearing secretions as much as previous as he has increased right upper quadrant pain with coughing.  He denies fevers, but feels cold lately.  His appetite is decreased.  He is drinking 1-2 Ensure per day and snacking.  He denies significant weight loss. He is in a wheelchair today due to weakness.  He is unable to use a walker in his home due to space, but he does have a  cane.  He fell 2 days ago and hit his head.  He denies headaches, vision changes, nausea, or vomiting since falling.  He has multiple medical problems including heart failure, aortic stenosis and history of myocardial infarction, for which he sees Dr. Bettina Gavia.  He has numbness and pain in his feet due to diabetic neuropathy.  Over 5 years ago, he lost nearly 200 pounds with a diabetic diet. ? ?REVIEW OF SYSTEMS:  ?Review of Systems  ?Constitutional:  Positive for fatigue. Negative for appetite change, chills, fever and unexpected weight change.  ?HENT:   Negative for lump/mass, mouth sores and sore throat.   ?Respiratory:  Positive for cough. Negative for shortness of breath.   ?Cardiovascular:  Positive for leg swelling. Negative for chest pain.  ?Gastrointestinal:  Positive for abdominal pain. Negative for blood in stool, constipation, diarrhea, nausea and vomiting.  ?Genitourinary:  Negative for difficulty urinating, dysuria, frequency and hematuria.   ?Musculoskeletal:  Negative for arthralgias, back pain and myalgias.  ?Skin:  Negative for itching, rash and wound.  ?Neurological:  Negative for dizziness, extremity weakness, headaches, light-headedness and numbness.  ?Hematological:  Negative for adenopathy.  ?Psychiatric/Behavioral:  Negative for depression and sleep disturbance. The patient is not nervous/anxious.    ? ?VITALS:  ?Blood pressure 123/60, pulse 100, temperature 98.2 ?F (36.8 ?C), temperature source Oral, resp. rate 20, height '6\' 4"'$  (1.93 m), weight 268 lb (121.6 kg), SpO2 98 %.  ?Wt Readings  from Last 3 Encounters:  ?09/21/21 268 lb (121.6 kg)  ?08/27/21 268 lb (121.6 kg)  ?08/24/21 268 lb (121.6 kg)  ?  ?Body mass index is 32.62 kg/m?. ? ?Performance status (ECOG): 3 - Symptomatic, >50% confined to bed ? ?PHYSICAL EXAM:  ?Physical Exam ?Vitals and nursing note reviewed.  ?Constitutional:   ?   General: He is not in acute distress. ?   Appearance: Normal appearance. He is obese.  ?HENT:  ?    Mouth/Throat:  ?   Mouth: Mucous membranes are moist.  ?   Pharynx: Oropharynx is clear. No oropharyngeal exudate or posterior oropharyngeal erythema.  ?Eyes:  ?   General: No scleral icterus. ?   Extraocular Movements: Extraocular movements intact.  ?   Conjunctiva/sclera: Conjunctivae normal.  ?   Pupils: Pupils are equal, round, and reactive to light.  ?Cardiovascular:  ?   Rate and Rhythm: Normal rate and regular rhythm.  ?   Heart sounds: Murmur heard.  ?Systolic murmur is present with a grade of 2/6.  ?  No friction rub. No gallop.  ?Pulmonary:  ?   Effort: Pulmonary effort is normal.  ?   Breath sounds: Normal breath sounds. No wheezing, rhonchi or rales.  ?Abdominal:  ?   General: Bowel sounds are normal. There is no distension.  ?   Palpations: Abdomen is soft. There is no fluid wave, hepatomegaly, splenomegaly or mass.  ?   Tenderness: There is abdominal tenderness in the right upper quadrant.  ?Musculoskeletal:     ?   General: Normal range of motion.  ?   Cervical back: Normal range of motion and neck supple. No tenderness.  ?   Right lower leg: 1+ Edema present.  ?   Left lower leg: 1+ Edema present.  ?Feet:  ?   Right foot:  ?   Skin integrity: Ulcer (Healing tiny ulcer inferior to the left great toe) and callus (Left medial great toe) present.  ?   Left foot:  ?   Skin integrity: Skin integrity normal.  ?Lymphadenopathy:  ?   Cervical: No cervical adenopathy.  ?   Upper Body:  ?   Right upper body: No supraclavicular or axillary adenopathy.  ?   Left upper body: No supraclavicular or axillary adenopathy.  ?   Lower Body: No right inguinal adenopathy. No left inguinal adenopathy.  ?Skin: ?   General: Skin is warm and dry.  ?   Coloration: Skin is not jaundiced.  ?   Findings: No rash or wound.  ?   Comments: Bilateral lower extremities with severe dryness and cracking  ?Neurological:  ?   Mental Status: He is alert and oriented to person, place, and time.  ?   Cranial Nerves: No cranial nerve deficit.   ?Psychiatric:     ?   Mood and Affect: Mood normal.     ?   Behavior: Behavior normal.     ?   Thought Content: Thought content normal.  ? ? ?LABS:  ? ? ?  Latest Ref Rng & Units 09/21/2021  ? 12:00 AM 01/19/2021  ?  5:23 AM 04/28/2020  ? 11:22 AM  ?CBC  ?WBC  28.5      9.4   9.3    ?Hemoglobin 13.5 - 17.5 11.0      13.7   14.1    ?Hematocrit 41 - 53 36      43.5   44.0    ?Platelets 150 - 400 K/uL  311      234   232    ?  ? This result is from an external source.  ? ? ?  Latest Ref Rng & Units 09/21/2021  ? 12:00 AM 01/19/2021  ?  5:23 AM 04/28/2020  ? 11:22 AM  ?CMP  ?Glucose 70 - 99 mg/dL  127   142    ?BUN 4 - '21 19      13   22    '$ ?Creatinine 0.6 - 1.3 0.9      1.32   1.32    ?Sodium 137 - 147 133      136   144    ?Potassium 3.5 - 5.1 mEq/L 4.5      3.8   4.5    ?Chloride 99 - 108 98      105   107    ?CO2 13 - '22 27      24   25    '$ ?Calcium 8.7 - 10.7 8.9      9.1   9.5    ?Total Protein 6.5 - 8.1 g/dL  6.2     ?Total Bilirubin 0.3 - 1.2 mg/dL  0.6     ?Alkaline Phos 25 - 125 183      87     ?AST 14 - 40 23      18     ?ALT 10 - 40 U/L 26      13     ?  ? This result is from an external source.  ? ? ? ?No results found for: CEA1 / No results found for: CEA1 ?No results found for: PSA1 ?No results found for: MOL078 ?No results found for: MLJ449  ?No results found for: TOTALPROTELP, ALBUMINELP, A1GS, A2GS, BETS, BETA2SER, GAMS, MSPIKE, SPEI ?No results found for: TIBC, FERRITIN, IRONPCTSAT ?No results found for: LDH ? ?STUDIES:  ?No results found.  ? ? Exam(s): [id number] CT/CT ABD-PELV W/IV CM  ?CLINICAL DATA: Intermittent abdominal pain for 2-3 weeks which is  ?worsened last night. Vomiting beginning last night.  ?EXAM:  ?CT ABDOMEN AND PELVIS WITH CONTRAST  ?TECHNIQUE:  ?Multidetector CT imaging of the abdomen and pelvis was performed  ?using the standard protocol following bolus administration of  ?intravenous contrast. ?  ?RADIATION DOSE REDUCTION: This exam was performed according to the  ?departmental  dose-optimization program which includes automated  ?exposure control, adjustment of the mA and/or kV according to  ?patient size and/or use of iterative reconstruction technique.  ?CONTRAST: One hundred mL IV Isovue 37

## 2021-09-22 LAB — CANCER ANTIGEN 19-9: CA 19-9: 85 U/mL — ABNORMAL HIGH (ref 0–35)

## 2021-09-24 ENCOUNTER — Ambulatory Visit: Payer: Medicare HMO | Admitting: Allergy and Immunology

## 2021-09-27 ENCOUNTER — Telehealth (HOSPITAL_COMMUNITY): Payer: Self-pay

## 2021-09-27 NOTE — Telephone Encounter (Signed)
Dr Bobby Rumpf notified that Craig Rollins, [place], from [facility], req PT visits weekly x 1, then twice weekly x 3, then once weekly x 5. He also requested verbal orders for Nursing and OT evals. Dr Bobby Rumpf agreed to above orders. Craig Rollins notified of above. ?

## 2021-10-02 ENCOUNTER — Telehealth: Payer: Self-pay | Admitting: Hematology and Oncology

## 2021-10-02 NOTE — Telephone Encounter (Signed)
Spoke with patient to follow-up as he canceled his biopsy yesterday.  He states he does not feel strong enough to undergo chemotherapy, so decided on comfort care only.  He has [facility] coming to the house at this time, so will not schedule any follow-up.  He will let us know if we can help in any way. ?

## 2021-10-05 ENCOUNTER — Telehealth: Payer: Self-pay | Admitting: Hematology and Oncology

## 2021-10-05 NOTE — Telephone Encounter (Signed)
Pt N/S'd Liver Biopsy at [facility] on 10/04/21.  LVM requesting pt to call us back to see if he wanted me to R/S this appt and to clarify if he rather go to [facility] for the procedure or [facility].

## 2021-10-18 ENCOUNTER — Ambulatory Visit (INDEPENDENT_AMBULATORY_CARE_PROVIDER_SITE_OTHER): Payer: Medicare HMO | Admitting: Podiatry

## 2021-10-18 ENCOUNTER — Ambulatory Visit: Payer: Medicare HMO | Admitting: Podiatry

## 2021-10-18 DIAGNOSIS — Z91199 Patient's noncompliance with other medical treatment and regimen due to unspecified reason: Secondary | ICD-10-CM

## 2021-10-18 NOTE — Progress Notes (Signed)
No show for appointment. No charge.

## 2021-10-19 DIAGNOSIS — R279 Unspecified lack of coordination: Secondary | ICD-10-CM | POA: Diagnosis not present

## 2021-10-19 DIAGNOSIS — Z743 Need for continuous supervision: Secondary | ICD-10-CM | POA: Diagnosis not present

## 2021-10-21 DIAGNOSIS — Z515 Encounter for palliative care: Secondary | ICD-10-CM | POA: Diagnosis not present

## 2021-10-21 DIAGNOSIS — R9431 Abnormal electrocardiogram [ECG] [EKG]: Secondary | ICD-10-CM | POA: Diagnosis not present

## 2021-10-21 DIAGNOSIS — E119 Type 2 diabetes mellitus without complications: Secondary | ICD-10-CM | POA: Diagnosis not present

## 2021-10-21 DIAGNOSIS — A419 Sepsis, unspecified organism: Secondary | ICD-10-CM | POA: Diagnosis not present

## 2021-10-21 DIAGNOSIS — Z95818 Presence of other cardiac implants and grafts: Secondary | ICD-10-CM | POA: Diagnosis not present

## 2021-10-21 DIAGNOSIS — R410 Disorientation, unspecified: Secondary | ICD-10-CM | POA: Diagnosis not present

## 2021-10-21 DIAGNOSIS — J9811 Atelectasis: Secondary | ICD-10-CM | POA: Diagnosis not present

## 2021-10-21 DIAGNOSIS — N400 Enlarged prostate without lower urinary tract symptoms: Secondary | ICD-10-CM | POA: Diagnosis not present

## 2021-10-21 DIAGNOSIS — K769 Liver disease, unspecified: Secondary | ICD-10-CM | POA: Diagnosis not present

## 2021-10-21 DIAGNOSIS — I130 Hypertensive heart and chronic kidney disease with heart failure and stage 1 through stage 4 chronic kidney disease, or unspecified chronic kidney disease: Secondary | ICD-10-CM | POA: Diagnosis not present

## 2021-10-21 DIAGNOSIS — R601 Generalized edema: Secondary | ICD-10-CM | POA: Diagnosis not present

## 2021-10-21 DIAGNOSIS — R0902 Hypoxemia: Secondary | ICD-10-CM | POA: Diagnosis not present

## 2021-10-21 DIAGNOSIS — N179 Acute kidney failure, unspecified: Secondary | ICD-10-CM | POA: Diagnosis not present

## 2021-10-21 DIAGNOSIS — I700 Atherosclerosis of aorta: Secondary | ICD-10-CM | POA: Diagnosis not present

## 2021-10-21 DIAGNOSIS — I469 Cardiac arrest, cause unspecified: Secondary | ICD-10-CM | POA: Diagnosis not present

## 2021-10-21 DIAGNOSIS — R6521 Severe sepsis with septic shock: Secondary | ICD-10-CM | POA: Diagnosis not present

## 2021-10-21 DIAGNOSIS — I2510 Atherosclerotic heart disease of native coronary artery without angina pectoris: Secondary | ICD-10-CM | POA: Diagnosis not present

## 2021-10-21 DIAGNOSIS — D649 Anemia, unspecified: Secondary | ICD-10-CM | POA: Diagnosis not present

## 2021-10-21 DIAGNOSIS — E7800 Pure hypercholesterolemia, unspecified: Secondary | ICD-10-CM | POA: Diagnosis not present

## 2021-10-21 DIAGNOSIS — C259 Malignant neoplasm of pancreas, unspecified: Secondary | ICD-10-CM | POA: Diagnosis not present

## 2021-10-21 DIAGNOSIS — C787 Secondary malignant neoplasm of liver and intrahepatic bile duct: Secondary | ICD-10-CM | POA: Diagnosis not present

## 2021-10-21 DIAGNOSIS — R918 Other nonspecific abnormal finding of lung field: Secondary | ICD-10-CM | POA: Diagnosis not present

## 2021-10-21 DIAGNOSIS — I959 Hypotension, unspecified: Secondary | ICD-10-CM | POA: Diagnosis not present

## 2021-10-21 DIAGNOSIS — R6520 Severe sepsis without septic shock: Secondary | ICD-10-CM | POA: Diagnosis not present

## 2021-10-21 DIAGNOSIS — I5032 Chronic diastolic (congestive) heart failure: Secondary | ICD-10-CM | POA: Diagnosis not present

## 2021-10-21 DIAGNOSIS — Z66 Do not resuscitate: Secondary | ICD-10-CM | POA: Diagnosis not present

## 2021-10-21 DIAGNOSIS — D72829 Elevated white blood cell count, unspecified: Secondary | ICD-10-CM | POA: Diagnosis not present

## 2021-10-21 DIAGNOSIS — I5030 Unspecified diastolic (congestive) heart failure: Secondary | ICD-10-CM | POA: Diagnosis not present

## 2021-11-17 DEATH — deceased

## 2023-04-21 IMAGING — MR MR HEAD W/O CM
9 of 11 series · 31 of 48 positions shown · non-contrast
Comparison: Noncontrast head CT and CT angiogram head/neck
01/18/2021. Brain MRI 04/17/2019.

CLINICAL DATA: Provided history: Neuro deficit, acute, stroke
suspected; tPA administered at [facility], this is 24 hours
post tPA scan.

EXAM:
MRI HEAD WITHOUT CONTRAST
TECHNIQUE: Multiplanar, multiecho pulse sequences of the brain and surrounding
structures were obtained without intravenous contrast.

[Series 2: DWI · axial · 3.0mm · 0.98mm/px · z∈[-11,+138]mm · 7 of 104 slices shown (1 of 2)]
[im 1/104]
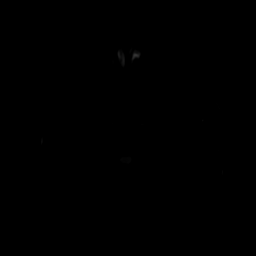
[im 18/104]
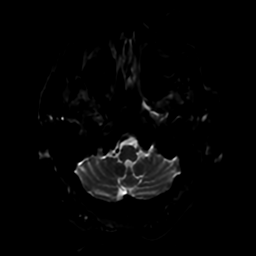
[im 35/104]
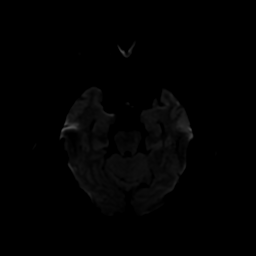
[im 52/104]
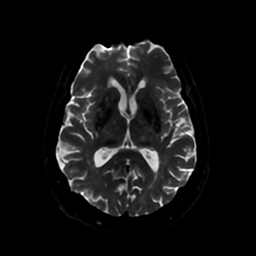
[im 69/104]
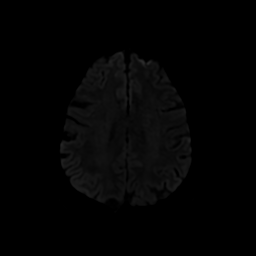
[im 86/104]
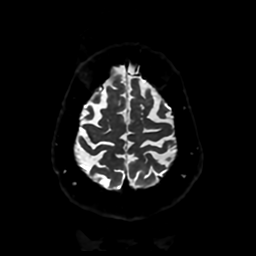
[im 104/104]
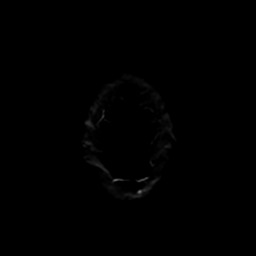

[Series 3: DWI · coronal · 4.0mm · 0.94mm/px · 5 of 74 slices shown (2 of 2)]
[im 1/74]
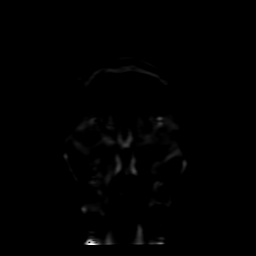
[im 19/74]
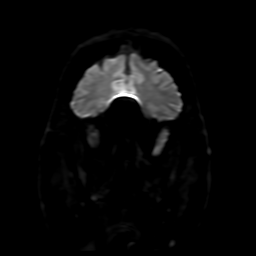
[im 37/74]
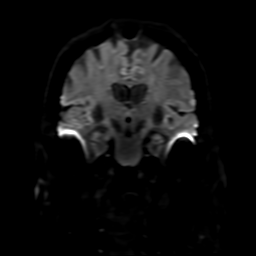
[im 55/74]
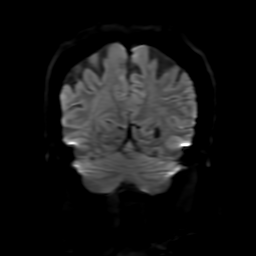
[im 74/74]
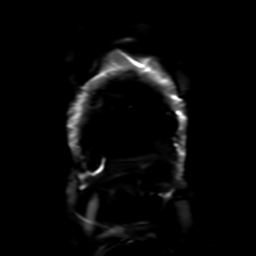

[Series 4: FLAIR · axial · 4.0mm · 0.45mm/px · z∈[-12,+134]mm · 3 of 35 slices shown (1 of 2)]
[im 1/35]
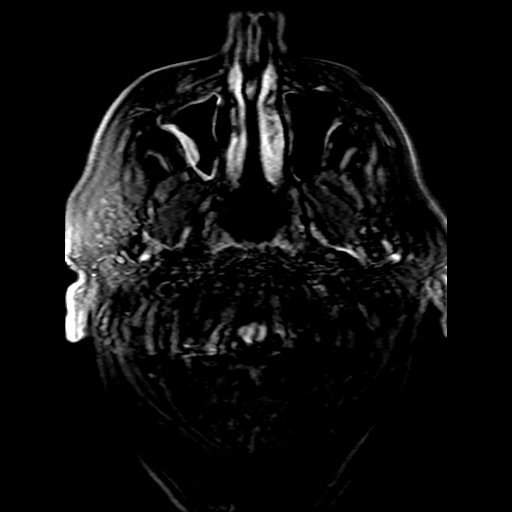
[im 18/35]
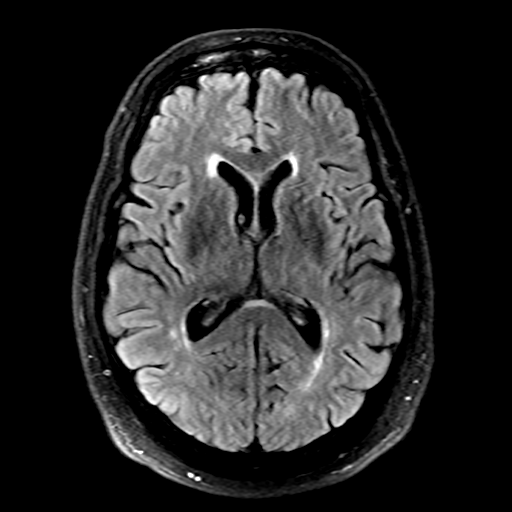
[im 35/35]
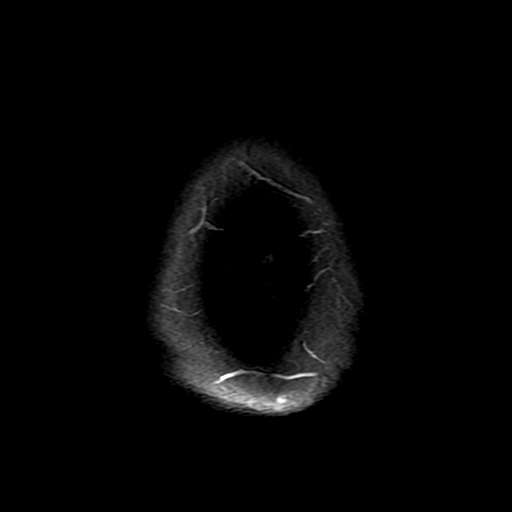

[Series 5: (person_name) · axial · 3.0mm · 0.47mm/px · z∈[-13,+30]mm · 3 of 104 slices shown]
[im 1/104]
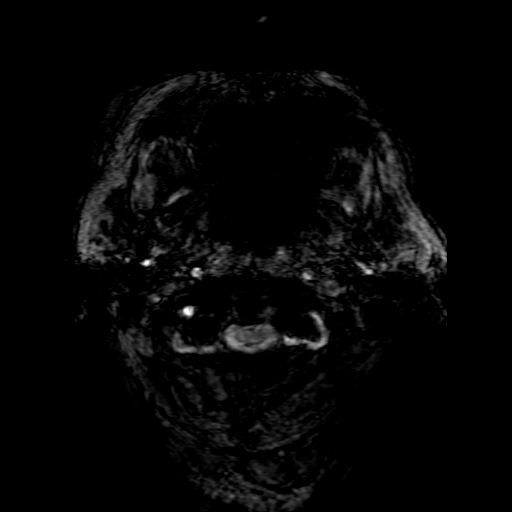
[im 15/104]
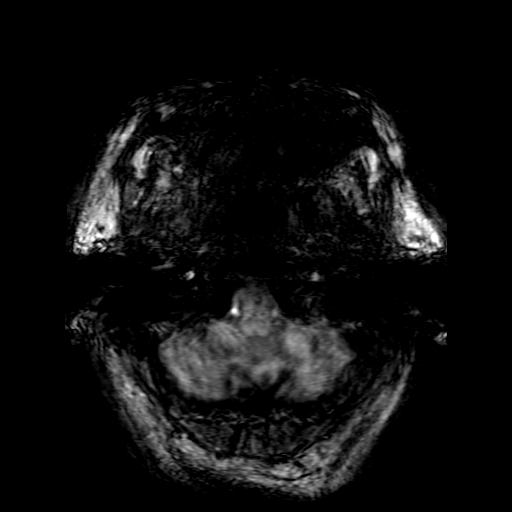
[im 30/104]
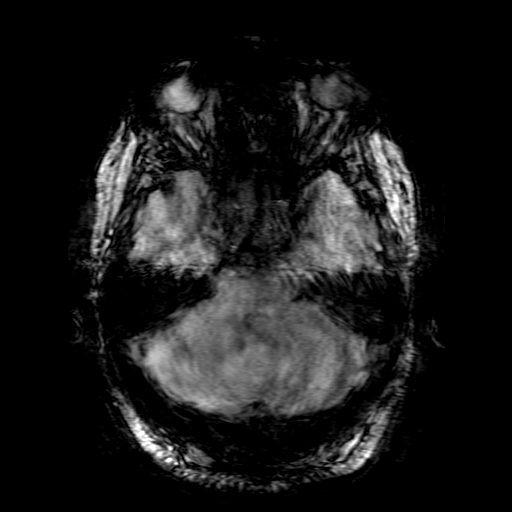

[Series 6: FLAIR · sagittal · 5.0mm · 0.49mm/px · 2 of 25 slices shown (2 of 2)]
[im 1/25]
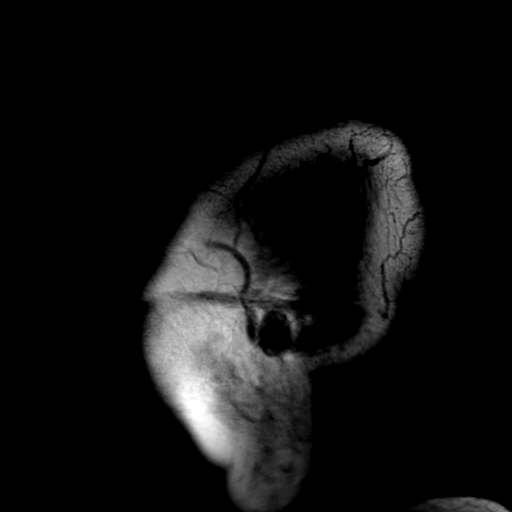
[im 25/25]
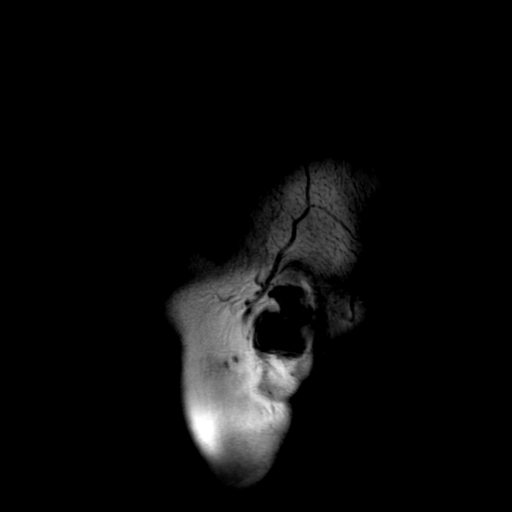

[Series 7: T2 · axial · 5.0mm · 0.47mm/px · z∈[-10,+136]mm · 2 of 26 slices shown (1 of 2)]
[im 1/26]
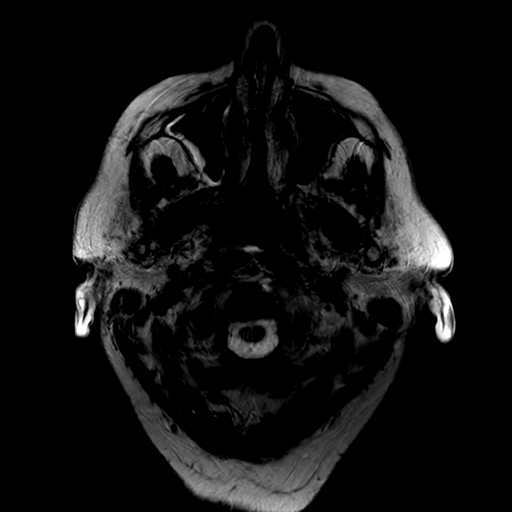
[im 26/26]
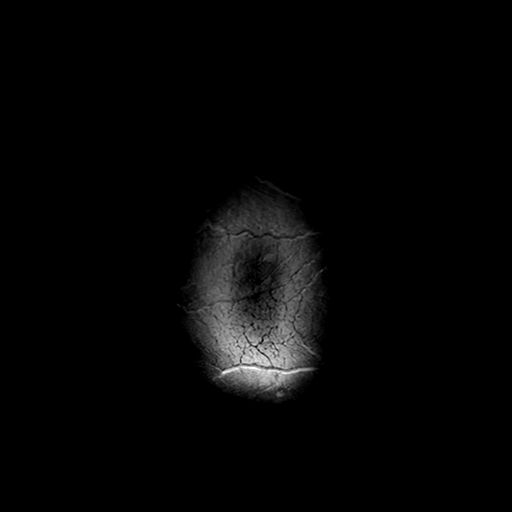

[Series 9: T2 · coronal · 5.0mm · 0.43mm/px · 2 of 31 slices shown (2 of 2)]
[im 1/31]
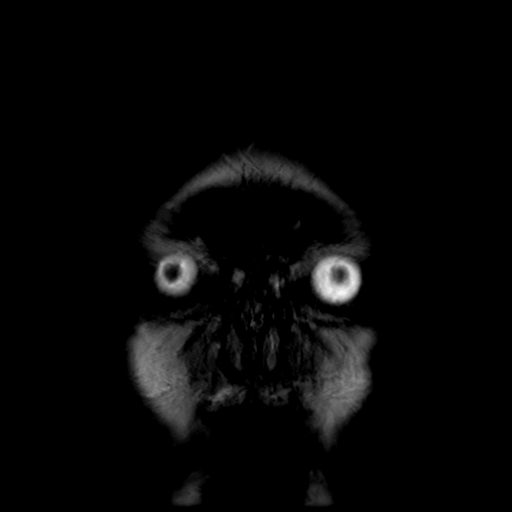
[im 31/31]
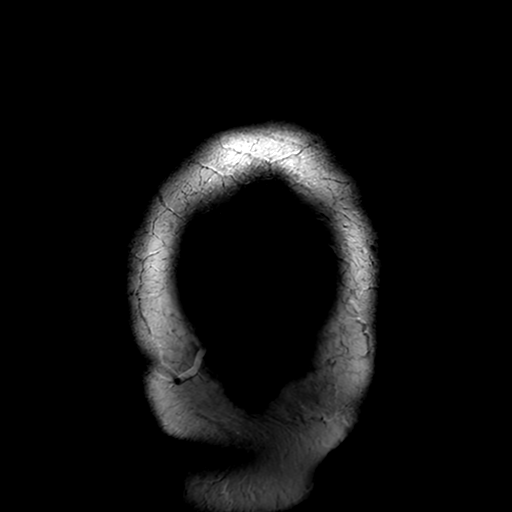

[Series 250: ADC · axial · 3.0mm · 0.98mm/px · z∈[-11,+138]mm · 4 of 52 slices shown (1 of 2)]
[im 1/52]
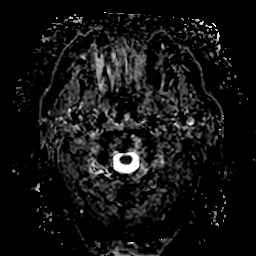
[im 18/52]
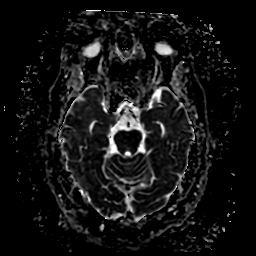
[im 35/52]
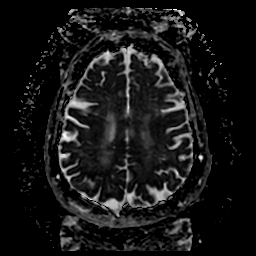
[im 52/52]
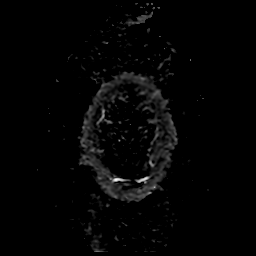

[Series 350: ADC · coronal · 4.0mm · 0.94mm/px · 3 of 36 slices shown (2 of 2)]
[im 1/36]
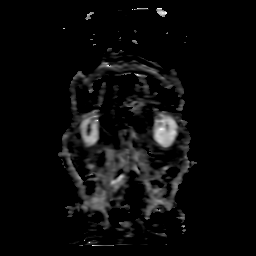
[im 18/36]
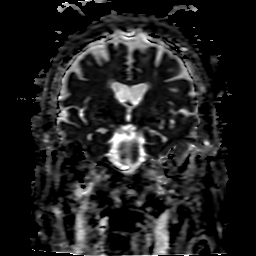
[im 36/36]
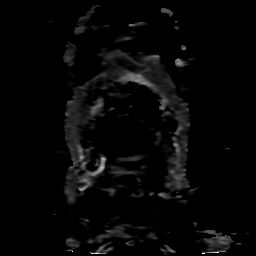

[31 of 48 positions shown; findings below may reference images not displayed]

FINDINGS: Brain:

Intermittently motion degraded examination, limiting evaluation.
Most notably, there is moderate/severe motion degradation of the
sagittal T1 weighted sequence, moderate/severe motion degradation of
the axial T2 TSE sequence, moderate motion degradation of the axial
T1 weighted sequence and moderate/severe motion degradation of the
coronal T2 TSE sequence.

Mild generalized cerebral and cerebellar atrophy.

Redemonstrated chronic lacunar infarcts within the deep gray nuclei
bilaterally.

Moderate multifocal T2/FLAIR hyperintensity within the cerebral
white matter, nonspecific but compatible chronic small vessel
ischemic disease. These signal changes have progressed as compared
to the brain MRI of 04/17/2019.

The diffusion-weighted imaging is of good quality. There is no acute
infarct.

No evidence of an intracranial mass.

No chronic intracranial blood products.

No extra-axial fluid collection.

No midline shift.

Vascular: Maintained flow voids within the proximal large arterial
vessels.

Skull and upper cervical spine: No focal suspicious marrow lesion.

Sinuses/Orbits: Visualized orbits show no acute finding. Mucosal
thickening and frothy secretions within the bilateral ethmoid and
left sphenoid sinuses. Mild mucosal thickening within the bilateral
maxillary sinuses at the imaged levels.
IMPRESSION: Motion degraded examination, as described and limiting evaluation.

The diffusion-weighted imaging is of good quality. No evidence of
acute infarction.

No acute intracranial abnormality is identified.

Redemonstrated chronic lacunar infarcts within the bilateral deep
gray nuclei.

Moderate chronic small-vessel ischemic changes within the cerebral
white matter, progressed from the brain MRI of 04/17/2019.

Mild generalized parenchymal atrophy.

Paranasal sinus disease, as described.
